# Patient Record
Sex: Male | Born: 1970 | Hispanic: Yes | Marital: Married | Smoking: Never smoker
Health system: Southern US, Community
[De-identification: ages and names within clinical notes are randomized; demographics above are authoritative.]

## PROBLEM LIST (undated history)

## (undated) DIAGNOSIS — I10 Essential (primary) hypertension: Secondary | ICD-10-CM

## (undated) DIAGNOSIS — E785 Hyperlipidemia, unspecified: Secondary | ICD-10-CM

## (undated) DIAGNOSIS — G5600 Carpal tunnel syndrome, unspecified upper limb: Secondary | ICD-10-CM

## (undated) HISTORY — PX: HERNIA REPAIR: SHX51

---

## 1999-09-05 ENCOUNTER — Emergency Department (HOSPITAL_COMMUNITY): Admission: EM | Admit: 1999-09-05 | Discharge: 1999-09-05 | Payer: Self-pay

## 2001-12-09 ENCOUNTER — Emergency Department (HOSPITAL_COMMUNITY): Admission: EM | Admit: 2001-12-09 | Discharge: 2001-12-09 | Payer: Self-pay | Admitting: Emergency Medicine

## 2001-12-09 ENCOUNTER — Encounter: Payer: Self-pay | Admitting: Emergency Medicine

## 2008-06-11 ENCOUNTER — Ambulatory Visit: Payer: Self-pay | Admitting: *Deleted

## 2008-06-11 ENCOUNTER — Encounter: Payer: Self-pay | Admitting: Family Medicine

## 2008-06-11 ENCOUNTER — Ambulatory Visit: Payer: Self-pay | Admitting: Internal Medicine

## 2008-06-11 LAB — CONVERTED CEMR LAB
ALT: 20 units/L (ref 0–53)
AST: 21 units/L (ref 0–37)
Albumin: 4.8 g/dL (ref 3.5–5.2)
Alkaline Phosphatase: 76 units/L (ref 39–117)
BUN: 15 mg/dL (ref 6–23)
Basophils Absolute: 0.1 10*3/uL (ref 0.0–0.1)
Basophils Relative: 1 % (ref 0–1)
CO2: 25 meq/L (ref 19–32)
Calcium: 10 mg/dL (ref 8.4–10.5)
Chloride: 106 meq/L (ref 96–112)
Cholesterol: 220 mg/dL — ABNORMAL HIGH (ref 0–200)
Creatinine, Ser: 0.81 mg/dL (ref 0.40–1.50)
Eosinophils Absolute: 0.2 10*3/uL (ref 0.0–0.7)
Eosinophils Relative: 3 % (ref 0–5)
Glucose, Bld: 98 mg/dL (ref 70–99)
HCT: 50.4 % (ref 39.0–52.0)
HDL: 38 mg/dL — ABNORMAL LOW (ref >39)
Hemoglobin: 17.7 g/dL — ABNORMAL HIGH (ref 13.0–17.0)
LDL Cholesterol: 138 mg/dL — ABNORMAL HIGH (ref 0–99)
Lymphocytes Relative: 41 % (ref 12–46)
Lymphs Abs: 2.9 10*3/uL (ref 0.7–4.0)
MCHC: 35.1 g/dL (ref 30.0–36.0)
MCV: 91.3 fL (ref 78.0–100.0)
Monocytes Absolute: 0.5 10*3/uL (ref 0.1–1.0)
Monocytes Relative: 7 % (ref 3–12)
Neutro Abs: 3.4 10*3/uL (ref 1.7–7.7)
Neutrophils Relative %: 48 % (ref 43–77)
PSA: 0.27 ng/mL (ref 0.10–4.00)
Platelets: 279 10*3/uL (ref 150–400)
Potassium: 4.7 meq/L (ref 3.5–5.3)
RBC: 5.52 M/uL (ref 4.22–5.81)
RDW: 13.2 % (ref 11.5–15.5)
Sodium: 143 meq/L (ref 135–145)
TSH: 2.064 microintl units/mL (ref 0.350–4.50)
Total Bilirubin: 1.4 mg/dL — ABNORMAL HIGH (ref 0.3–1.2)
Total CHOL/HDL Ratio: 5.8
Total Protein: 7.8 g/dL (ref 6.0–8.3)
Triglycerides: 219 mg/dL — ABNORMAL HIGH (ref <150)
VLDL: 44 mg/dL — ABNORMAL HIGH (ref 0–40)
WBC: 7.1 10*3/uL (ref 4.0–10.5)

## 2008-06-21 ENCOUNTER — Emergency Department (HOSPITAL_COMMUNITY): Admission: EM | Admit: 2008-06-21 | Discharge: 2008-06-21 | Payer: Self-pay | Admitting: Emergency Medicine

## 2008-06-30 ENCOUNTER — Ambulatory Visit: Payer: Self-pay | Admitting: Internal Medicine

## 2008-06-30 ENCOUNTER — Encounter: Payer: Self-pay | Admitting: Family Medicine

## 2008-06-30 LAB — CONVERTED CEMR LAB
ALT: 29 units/L (ref 0–53)
AST: 19 units/L (ref 0–37)
Albumin: 4.7 g/dL (ref 3.5–5.2)
Alkaline Phosphatase: 72 units/L (ref 39–117)
Amphetamine Screen, Ur: NEGATIVE
BUN: 16 mg/dL (ref 6–23)
Barbiturate Quant, Ur: NEGATIVE
Benzodiazepines.: NEGATIVE
CO2: 24 meq/L (ref 19–32)
Calcium: 9.7 mg/dL (ref 8.4–10.5)
Chloride: 103 meq/L (ref 96–112)
Cocaine Metabolites: NEGATIVE
Creatinine, Ser: 0.78 mg/dL (ref 0.40–1.50)
Creatinine,U: 97 mg/dL
Glucose, Bld: 100 mg/dL — ABNORMAL HIGH (ref 70–99)
Marijuana Metabolite: NEGATIVE
Methadone: NEGATIVE
Opiate Screen, Urine: NEGATIVE
Phencyclidine (PCP): NEGATIVE
Potassium: 4.8 meq/L (ref 3.5–5.3)
Propoxyphene: NEGATIVE
Sed Rate: 2 mm/hr (ref 0–16)
Sodium: 140 meq/L (ref 135–145)
Total Bilirubin: 0.9 mg/dL (ref 0.3–1.2)
Total Protein: 7.5 g/dL (ref 6.0–8.3)

## 2008-07-09 ENCOUNTER — Ambulatory Visit: Payer: Self-pay | Admitting: Vascular Surgery

## 2008-07-09 ENCOUNTER — Ambulatory Visit (HOSPITAL_COMMUNITY): Admission: RE | Admit: 2008-07-09 | Discharge: 2008-07-09 | Payer: Self-pay | Admitting: *Deleted

## 2008-08-07 ENCOUNTER — Ambulatory Visit: Payer: Self-pay | Admitting: Family Medicine

## 2008-10-22 ENCOUNTER — Ambulatory Visit: Payer: Self-pay | Admitting: Family Medicine

## 2008-10-22 LAB — CONVERTED CEMR LAB
Chlamydia, DNA Probe: NEGATIVE
GC Probe Amp, Genital: NEGATIVE

## 2009-12-08 ENCOUNTER — Ambulatory Visit: Payer: Self-pay | Admitting: Internal Medicine

## 2010-01-06 ENCOUNTER — Ambulatory Visit: Payer: Self-pay | Admitting: Internal Medicine

## 2010-01-06 LAB — CONVERTED CEMR LAB
BUN: 15 mg/dL (ref 6–23)
CO2: 25 meq/L (ref 19–32)
Calcium: 9.9 mg/dL (ref 8.4–10.5)
Chloride: 102 meq/L (ref 96–112)
Cholesterol: 228 mg/dL — ABNORMAL HIGH (ref 0–200)
Creatinine, Ser: 0.71 mg/dL (ref 0.40–1.50)
Glucose, Bld: 93 mg/dL (ref 70–99)
HDL: 39 mg/dL — ABNORMAL LOW (ref >39)
Hgb A1c MFr Bld: 5.2 % (ref 4.6–6.1)
LDL Cholesterol: 110 mg/dL — ABNORMAL HIGH (ref 0–99)
Potassium: 4.4 meq/L (ref 3.5–5.3)
Sodium: 138 meq/L (ref 135–145)
Testosterone: 509.14 ng/dL (ref 350–890)
Total CHOL/HDL Ratio: 5.8
Triglycerides: 395 mg/dL — ABNORMAL HIGH (ref <150)
VLDL: 79 mg/dL — ABNORMAL HIGH (ref 0–40)

## 2010-01-12 ENCOUNTER — Ambulatory Visit: Payer: Self-pay | Admitting: Internal Medicine

## 2010-02-15 ENCOUNTER — Ambulatory Visit: Payer: Self-pay | Admitting: Internal Medicine

## 2010-02-15 LAB — CONVERTED CEMR LAB
CO2: 24 meq/L (ref 19–32)
Calcium: 9.2 mg/dL (ref 8.4–10.5)
Chloride: 104 meq/L (ref 96–112)
Creatinine, Ser: 0.66 mg/dL (ref 0.40–1.50)
Sodium: 139 meq/L (ref 135–145)

## 2010-02-23 ENCOUNTER — Ambulatory Visit: Payer: Self-pay | Admitting: Internal Medicine

## 2010-11-30 ENCOUNTER — Encounter (INDEPENDENT_AMBULATORY_CARE_PROVIDER_SITE_OTHER): Payer: Self-pay | Admitting: Family Medicine

## 2010-11-30 LAB — CONVERTED CEMR LAB
ALT: 18 units/L (ref 0–53)
AST: 24 units/L (ref 0–37)
Albumin: 4.7 g/dL (ref 3.5–5.2)
CO2: 27 meq/L (ref 19–32)
Calcium: 10.3 mg/dL (ref 8.4–10.5)
Chloride: 102 meq/L (ref 96–112)
Cholesterol: 229 mg/dL — ABNORMAL HIGH (ref 0–200)
Creatinine, Ser: 0.76 mg/dL (ref 0.40–1.50)
Potassium: 5.2 meq/L (ref 3.5–5.3)
Total Protein: 7.4 g/dL (ref 6.0–8.3)

## 2012-08-13 ENCOUNTER — Encounter (HOSPITAL_COMMUNITY): Payer: Self-pay | Admitting: Emergency Medicine

## 2012-08-13 ENCOUNTER — Emergency Department (HOSPITAL_COMMUNITY): Payer: Self-pay

## 2012-08-13 ENCOUNTER — Emergency Department (HOSPITAL_COMMUNITY)
Admission: EM | Admit: 2012-08-13 | Discharge: 2012-08-13 | Disposition: A | Discharge status: Home / Self Care | Payer: Self-pay | Attending: Emergency Medicine | Admitting: Emergency Medicine

## 2012-08-13 DIAGNOSIS — W19XXXA Unspecified fall, initial encounter: Secondary | ICD-10-CM

## 2012-08-13 DIAGNOSIS — I10 Essential (primary) hypertension: Secondary | ICD-10-CM | POA: Insufficient documentation

## 2012-08-13 DIAGNOSIS — S20229A Contusion of unspecified back wall of thorax, initial encounter: Secondary | ICD-10-CM | POA: Insufficient documentation

## 2012-08-13 DIAGNOSIS — S39012A Strain of muscle, fascia and tendon of lower back, initial encounter: Secondary | ICD-10-CM

## 2012-08-13 DIAGNOSIS — N200 Calculus of kidney: Secondary | ICD-10-CM | POA: Insufficient documentation

## 2012-08-13 DIAGNOSIS — W108XXA Fall (on) (from) other stairs and steps, initial encounter: Secondary | ICD-10-CM | POA: Insufficient documentation

## 2012-08-13 DIAGNOSIS — Y92009 Unspecified place in unspecified non-institutional (private) residence as the place of occurrence of the external cause: Secondary | ICD-10-CM | POA: Insufficient documentation

## 2012-08-13 HISTORY — DX: Essential (primary) hypertension: I10

## 2012-08-13 MED ORDER — HYDROCODONE-ACETAMINOPHEN 5-500 MG PO TABS: 1.0000 | ORAL_TABLET | Freq: Four times a day (QID) | ORAL | Status: DC | PRN

## 2012-08-13 MED ORDER — HYDROCODONE-ACETAMINOPHEN 5-325 MG PO TABS
2.0000 | ORAL_TABLET | Freq: Once | ORAL | Status: AC
  Administered 2012-08-13: 2 via ORAL
  Filled 2012-08-13: qty 2

## 2012-08-13 NOTE — ED Notes (Signed)
Pt c/o fall down 5 stairs and now having pain in lower back; pt sts some difficulty standing due to pain; CMS intact

## 2012-08-13 NOTE — ED Provider Notes (Signed)
History    This chart was scribed for Suzi Roots, MD, MD by Smitty Pluck. The patient was seen in room TR11C and the patient's care was started at 12:03PM.   CSN: [id number]  Arrival date & time 08/13/12  1133      Chief Complaint  Patient presents with  . Fall  . Back Pain    (Consider location/radiation/quality/duration/timing/severity/associated sxs/prior treatment) Patient is a 41 y.o. male presenting with fall and back pain. The history is provided by the patient and a relative. No language interpreter was used.  Fall Pertinent negatives include no fever, no abdominal pain, no nausea, no vomiting and no headaches.  Back Pain  Pertinent negatives include no chest pain, no fever, no headaches, no abdominal pain and no weakness.   Shawn Porter is a 41 y.o. male who presents to the Emergency Department complaining of constant, moderate lower back pain onset today due to falling down steps at home. Pt reports that standing and bearing weight aggravates pain. Denies hx of back pain, head injury, chest pain, abdominal pain, chest pain and LOC. Denies taking medication pta.   No loc. No faintness or dizziness. No headache. No neck pain. No numbness/weakness. No gi or gu c/o.   Past Medical History  Diagnosis Date  . Hypertension     History reviewed. No pertinent past surgical history.  History reviewed. No pertinent family history.  History  Substance Use Topics  . Smoking status: Never Smoker   . Smokeless tobacco: Not on file  . Alcohol Use: No      Review of Systems  Constitutional: Negative for fever and chills.  HENT: Negative for neck pain.   Eyes: Negative for redness.  Respiratory: Negative for shortness of breath.   Cardiovascular: Negative for chest pain.  Gastrointestinal: Negative for nausea, vomiting and abdominal pain.  Musculoskeletal: Positive for back pain.  Skin: Negative for wound.  Neurological: Negative for weakness, light-headedness  and headaches.  Psychiatric/Behavioral: Negative for confusion.    Allergies  Review of patient's allergies indicates no known allergies.  Home Medications  No current outpatient prescriptions on file.  BP 155/108  Pulse 64  Temp 98.4 F (36.9 C) (Oral)  Resp 18  SpO2 100%  Physical Exam  Nursing note and vitals reviewed. Constitutional: He is oriented to person, place, and time. He appears well-developed and well-nourished. No distress.  HENT:  Head: Normocephalic and atraumatic.  Eyes: Conjunctivae normal are normal. Pupils are equal, round, and reactive to light. No scleral icterus.  Neck: Normal range of motion. Neck supple. No tracheal deviation present.  Cardiovascular: Normal rate, regular rhythm, normal heart sounds and intact distal pulses.   Pulmonary/Chest: Effort normal. No respiratory distress. He exhibits no tenderness.  Abdominal: Soft. He exhibits no distension. There is no tenderness.  Musculoskeletal:       Diffuse lumbar tenderness,. Otherwise CTLS spine, non tender, aligned, no step off.   Neurological: He is alert and oriented to person, place, and time. No cranial nerve deficit.       Motor intact bil.   Skin: Skin is warm and dry. No rash noted.  Psychiatric: He has a normal mood and affect. His behavior is normal.    ED Course  Procedures (including critical care time) DIAGNOSTIC STUDIES: Oxygen Saturation is 100% on room air, normal by my interpretation.    COORDINATION OF CARE: 12:05PM  Discussed ED treatment with pt  12:17 PM    . HYDROcodone-acetaminophen  2 tablet Oral  Once   Dg Lumbar Spine Complete  08/13/2012  *RADIOLOGY REPORT*  Clinical Data: Fall, low back pain  LUMBAR SPINE - COMPLETE 4+ VIEW  Comparison: None.  Findings: Nonobstructing left lower renal pole calculus noted measuring 4 mm. Five non-rib bearing lumbar type vertebral bodies are identified.  Normal alignment.  No fracture or dislocation. Normal visualized bowel gas  pattern.  IMPRESSION: No acute osseous abnormality of the lumbar spine.   Original Report Authenticated By: [name] Lemon, M.D.          MDM  I personally performed the services described in this documentation, which was scribed in my presence. The recorded information has been reviewed and considered. Suzi Roots, MD  vicodin po ( no meds pta, pt has ride/does not have to drive). Confirmed nkda w pt.   Xrays.   Xray neg acute.    Suzi Roots, MD 08/13/12 [phone]

## 2012-11-08 ENCOUNTER — Emergency Department (INDEPENDENT_AMBULATORY_CARE_PROVIDER_SITE_OTHER)
Admission: EM | Admit: 2012-11-08 | Discharge: 2012-11-08 | Disposition: A | Discharge status: Home / Self Care | Payer: Self-pay | Source: Home / Self Care

## 2012-11-08 ENCOUNTER — Encounter (HOSPITAL_COMMUNITY): Payer: Self-pay | Admitting: Emergency Medicine

## 2012-11-08 DIAGNOSIS — E7800 Pure hypercholesterolemia, unspecified: Secondary | ICD-10-CM

## 2012-11-08 DIAGNOSIS — R229 Localized swelling, mass and lump, unspecified: Secondary | ICD-10-CM

## 2012-11-08 DIAGNOSIS — M545 Low back pain, unspecified: Secondary | ICD-10-CM

## 2012-11-08 DIAGNOSIS — I10 Essential (primary) hypertension: Secondary | ICD-10-CM

## 2012-11-08 HISTORY — DX: Hyperlipidemia, unspecified: E78.5

## 2012-11-08 LAB — CBC WITH DIFFERENTIAL/PLATELET
Basophils Absolute: 0 10*3/uL (ref 0.0–0.1)
Basophils Relative: 1 % (ref 0–1)
Eosinophils Absolute: 0.3 10*3/uL (ref 0.0–0.7)
Eosinophils Relative: 4 % (ref 0–5)
HCT: 44.8 % (ref 39.0–52.0)
Hemoglobin: 16.3 g/dL (ref 13.0–17.0)
Lymphocytes Relative: 43 % (ref 12–46)
Lymphs Abs: 2.9 10*3/uL (ref 0.7–4.0)
MCH: 32.2 pg (ref 26.0–34.0)
MCHC: 36.4 g/dL — ABNORMAL HIGH (ref 30.0–36.0)
MCV: 88.5 fL (ref 78.0–100.0)
Monocytes Absolute: 0.5 10*3/uL (ref 0.1–1.0)
Monocytes Relative: 7 % (ref 3–12)
Neutro Abs: 3.1 10*3/uL (ref 1.7–7.7)
Neutrophils Relative %: 46 % (ref 43–77)
Platelets: 249 10*3/uL (ref 150–400)
RBC: 5.06 MIL/uL (ref 4.22–5.81)
RDW: 12.4 % (ref 11.5–15.5)
WBC: 6.9 10*3/uL (ref 4.0–10.5)

## 2012-11-08 LAB — COMPREHENSIVE METABOLIC PANEL
ALT: 38 U/L (ref 0–53)
AST: 28 U/L (ref 0–37)
Albumin: 4.1 g/dL (ref 3.5–5.2)
Alkaline Phosphatase: 67 U/L (ref 39–117)
BUN: 13 mg/dL (ref 6–23)
CO2: 26 mEq/L (ref 19–32)
Calcium: 9.1 mg/dL (ref 8.4–10.5)
Chloride: 104 mEq/L (ref 96–112)
Creatinine, Ser: 0.78 mg/dL (ref 0.50–1.35)
GFR calc Af Amer: >90 mL/min (ref >90)
GFR calc non Af Amer: >90 mL/min (ref >90)
Glucose, Bld: 96 mg/dL (ref 70–99)
Potassium: 3.8 mEq/L (ref 3.5–5.1)
Sodium: 138 mEq/L (ref 135–145)
Total Bilirubin: 0.7 mg/dL (ref 0.3–1.2)
Total Protein: 7 g/dL (ref 6.0–8.3)

## 2012-11-08 MED ORDER — LISINOPRIL 10 MG PO TABS: 10.0000 mg | ORAL_TABLET | Freq: Every day | ORAL | Status: DC

## 2012-11-08 MED ORDER — HYDROCODONE-ACETAMINOPHEN 5-500 MG PO CAPS: 1.0000 | ORAL_CAPSULE | Freq: Four times a day (QID) | ORAL | Status: DC | PRN

## 2012-11-08 MED ORDER — PRAVASTATIN SODIUM 20 MG PO TABS: 20.0000 mg | ORAL_TABLET | Freq: Every day | ORAL | Status: DC

## 2012-11-08 NOTE — ED Notes (Signed)
Pt is here to have his lisinopril 10mg  and Pravastatin 20mg  refilled Has not been able to find PCP due to [facility]'s closure Also c/o abscess behind neck x20 days No pain associated  Denies: fevers, vomiting, nauseas, diarrhea  He is alert w/no signs of acute distress.

## 2012-11-11 ENCOUNTER — Telehealth (HOSPITAL_COMMUNITY): Payer: Self-pay | Admitting: Family Medicine

## 2012-11-11 NOTE — ED Provider Notes (Signed)
Medical screening examination/treatment/procedure(s) were performed by non-physician practitioner and as supervising physician I was immediately available for consultation/collaboration.  Raynald Blend, MD 11/11/12 1627

## 2012-11-11 NOTE — ED Notes (Signed)
Pharmacy called stating Hydrocodone-Acetaminophen 5-500 is no longer available.  Dr Lorenz Coaster reviewed chart RX changed to hydrocodone-Acetaminophen 5-325 same directions and dispense quantity.

## 2012-11-11 NOTE — ED Provider Notes (Signed)
History     CSN: [id number]  Arrival date & time 11/08/12  1653   None     Chief Complaint  Patient presents with  . Medication Refill  . Abscess    HPI: The history is provided by the patient.  Pt reports he needs his Lisinopril and Pravachol refilled. He was a pt at [facility] and has been unable to find a PCP since the closure of [facility]. Denies any symptoms at this time. Pt does request evaluation of some nodules on his upper back and neck.  The lesion to his posterior neck he has had approx 20 days. States it is not painful and does not itch. The 2 nodules on his upper back he states he has had a "long time".  Past Medical History  Diagnosis Date  . Hypertension   . Hyperlipidemia     History reviewed. No pertinent past surgical history.  No family history on file.  History  Substance Use Topics  . Smoking status: Never Smoker   . Smokeless tobacco: Not on file  . Alcohol Use: No      Review of Systems  Constitutional: Negative.   HENT: Negative.   Eyes: Negative.   Respiratory: Negative.   Cardiovascular: Negative.   Gastrointestinal: Negative.   Genitourinary: Negative.   Musculoskeletal: Negative.   Neurological: Negative.   Hematological: Negative.   Psychiatric/Behavioral: Negative.     Allergies  Review of patient's allergies indicates no known allergies.  Home Medications   Current Outpatient Rx  Name  Route  Sig  Dispense  Refill  . LISINOPRIL 10 MG PO TABS   Oral   Take 10 mg by mouth daily.         Marland Kitchen HYDROCODONE-ACETAMINOPHEN 5-500 MG PO CAPS   Oral   Take 1 capsule by mouth every 6 (six) hours as needed for pain.   10 capsule   0   . LISINOPRIL 10 MG PO TABS   Oral   Take 1 tablet (10 mg total) by mouth daily.   30 tablet   0   . PRAVASTATIN SODIUM 20 MG PO TABS   Oral   Take 1 tablet (20 mg total) by mouth daily.   30 tablet   0   . PRAVASTATIN SODIUM 20 MG PO TABS   Oral   Take 1 tablet (20 mg total) by  mouth daily.   30 tablet   1     BP 119/64  Pulse 60  Temp 98.7 F (37.1 C) (Oral)  Resp 16  SpO2 100%  Physical Exam  Constitutional: He is oriented to person, place, and time. He appears well-developed and well-nourished.  HENT:  Head: Normocephalic and atraumatic.  Eyes: Conjunctivae normal are normal.  Neck: Normal range of motion. Neck supple.  Cardiovascular: Normal rate and regular rhythm.   Pulmonary/Chest: Effort normal and breath sounds normal.  Musculoskeletal: Normal range of motion.  Neurological: He is alert and oriented to person, place, and time.  Skin: Skin is warm and dry.          The lesion to the posterior neck is approx 1.5 cm in size, slightly raised, eryhthematous with an orange peel type surface. It does not have active drainage or any evidence of local cellulitis. There is no palpable fluctuance. No TTP.  There are 2 raised nodules to pt's upper back that are approx 2cm in size,  firm to palpation, w/o redness, inflammation or drainage. No TTP, No palpable fluctuance.  Psychiatric: He has a normal mood and affect.    ED Course  Procedures (including critical care time)  Labs Reviewed  CBC WITH DIFFERENTIAL - Abnormal; Notable for the following:    MCHC 36.4 (*)     All other components within normal limits  COMPREHENSIVE METABOLIC PANEL  LAB REPORT - SCANNED   No results found.   1. HTN (hypertension)   2. High cholesterol   3. Multiple skin nodules   4. Low back pain       MDM  42 y/o w/ h/o HTH and hyperlipidemia presents for med refill. No PCP since closing of [facility]. Also requested eval of nodules to posterior neck and upper back. Lesion to upper back is somewhat erythematous w/ (orange peel surface). Though unclear exact nature of this lesion it does not appear infectious or in immediate need of treatment. Prefer to wait and watch. The nodules (possible lipomas) to his upper back appear chronic in nature w/o evidence of  infectious process. Will refill Lisinopril and Pravachol and assist pt in arranging f[facility]u in the [facility].        Leanne Chang, NP 11/11/12 1041

## 2012-12-20 ENCOUNTER — Emergency Department (INDEPENDENT_AMBULATORY_CARE_PROVIDER_SITE_OTHER)
Admission: EM | Admit: 2012-12-20 | Discharge: 2012-12-20 | Disposition: A | Discharge status: Home / Self Care | Payer: Self-pay | Source: Home / Self Care | Attending: Family Medicine | Admitting: Family Medicine

## 2012-12-20 ENCOUNTER — Encounter (HOSPITAL_COMMUNITY): Payer: Self-pay | Admitting: *Deleted

## 2012-12-20 DIAGNOSIS — G8929 Other chronic pain: Secondary | ICD-10-CM

## 2012-12-20 DIAGNOSIS — M545 Low back pain: Secondary | ICD-10-CM

## 2012-12-20 DIAGNOSIS — R52 Pain, unspecified: Secondary | ICD-10-CM

## 2012-12-20 MED ORDER — KETOROLAC TROMETHAMINE 30 MG/ML IJ SOLN
30.0000 mg | Freq: Once | INTRAMUSCULAR | Status: AC
  Administered 2012-12-20: 30 mg via INTRAMUSCULAR

## 2012-12-20 MED ORDER — HYDROCODONE-ACETAMINOPHEN 5-325 MG PO TABS
1.0000 | ORAL_TABLET | Freq: Once | ORAL | Status: AC
  Administered 2012-12-20: 1 via ORAL

## 2012-12-20 MED ORDER — HYDROCODONE-ACETAMINOPHEN 5-325 MG PO TABS
ORAL_TABLET | ORAL | Status: AC
  Filled 2012-12-20: qty 1

## 2012-12-20 MED ORDER — CYCLOBENZAPRINE HCL 10 MG PO TABS: 10.0000 mg | ORAL_TABLET | Freq: Two times a day (BID) | ORAL | Status: DC | PRN

## 2012-12-20 MED ORDER — TRAMADOL HCL 50 MG PO TABS: 50.0000 mg | ORAL_TABLET | Freq: Four times a day (QID) | ORAL | Status: DC | PRN

## 2012-12-20 MED ORDER — KETOROLAC TROMETHAMINE 30 MG/ML IJ SOLN
INTRAMUSCULAR | Status: AC
  Filled 2012-12-20: qty 1

## 2012-12-20 MED ORDER — IBUPROFEN 600 MG PO TABS: 600.0000 mg | ORAL_TABLET | Freq: Three times a day (TID) | ORAL | Status: DC | PRN

## 2012-12-20 NOTE — ED Notes (Signed)
Pt  States  He  Was  Lifting   A  Heavy  Object  Yesterday  And  Felt  A   Severe  Pain in  His  Back  He  Ambulates  Very  Slowly  And  Bent  Over              He  Reports    Has  hab  Back  Problems  In past  He  denys  Any urinary  Symptoms

## 2012-12-20 NOTE — ED Provider Notes (Signed)
History     CSN: [id number]  Arrival date & time 12/20/12  1222   First MD Initiated Contact with Patient 12/20/12 1253      Chief Complaint  Patient presents with  . Back Pain    (Consider location/radiation/quality/duration/timing/severity/associated sxs/prior treatment) HPI Comments: 42 year old male with history of hypertension and recurrent low back pain. Here complaining of exacerbation of chronic low back pain. Patient states that she lifted a pressure washer machine just today and developed severe pain in his low back. Pain worse with standing, walking and moving. Denies radiation to the lower extremities. Denies lower extremity numbness weakness or paresthesia. Denies dysuria or hematuria. No nausea or vomiting. No abdominal pain. No fever or chills. Patient to a hydrocodone tablet earlier today from prior prescription. He had lumbar spine x-rays in October 2013 with normal results.     Past Medical History  Diagnosis Date  . Hypertension   . Hyperlipidemia     History reviewed. No pertinent past surgical history.  No family history on file.  History  Substance Use Topics  . Smoking status: Never Smoker   . Smokeless tobacco: Not on file  . Alcohol Use: Yes     Comment: occasonally      Review of Systems  Constitutional: Negative for fever, chills and fatigue.  Respiratory: Negative for shortness of breath.   Cardiovascular: Negative for chest pain.  Gastrointestinal: Negative for nausea, vomiting, abdominal pain and diarrhea.  Genitourinary: Negative for dysuria, frequency, hematuria and flank pain.  Musculoskeletal: Positive for back pain. Negative for myalgias and arthralgias.  Neurological: Negative for weakness, numbness and headaches.  All other systems reviewed and are negative.    Allergies  Review of patient's allergies indicates no known allergies.  Home Medications   Current Outpatient Rx  Name  Route  Sig  Dispense  Refill  .  cyclobenzaprine (FLEXERIL) 10 MG tablet   Oral   Take 1 tablet (10 mg total) by mouth 2 (two) times daily as needed for muscle spasms.   20 tablet   0   . hydrocodone-acetaminophen (LORCET-HD) 5-500 MG per capsule   Oral   Take 1 capsule by mouth every 6 (six) hours as needed for pain.   10 capsule   0   . ibuprofen (ADVIL,MOTRIN) 600 MG tablet   Oral   Take 1 tablet (600 mg total) by mouth every 8 (eight) hours as needed for pain. Take with food   30 tablet   0   . lisinopril (PRINIVIL) 10 MG tablet   Oral   Take 1 tablet (10 mg total) by mouth daily.   30 tablet   0   . lisinopril (PRINIVIL,ZESTRIL) 10 MG tablet   Oral   Take 10 mg by mouth daily.         . pravastatin (PRAVACHOL) 20 MG tablet   Oral   Take 1 tablet (20 mg total) by mouth daily.   30 tablet   0   . pravastatin (PRAVACHOL) 20 MG tablet   Oral   Take 1 tablet (20 mg total) by mouth daily.   30 tablet   1   . traMADol (ULTRAM) 50 MG tablet   Oral   Take 1 tablet (50 mg total) by mouth every 6 (six) hours as needed for pain.   15 tablet   0     BP 152/76  Pulse 88  Temp(Src) 98.6 F (37 C) (Oral)  SpO2 100%  Physical Exam  Nursing note  and vitals reviewed. Constitutional: He is oriented to person, place, and time. He appears well-developed and well-nourished.  Uncomfortable with pain.  HENT:  Head: Normocephalic and atraumatic.  Eyes: No scleral icterus.  Neck: Neck supple.  Cardiovascular: Normal heart sounds.   Pulmonary/Chest: Breath sounds normal.  Abdominal: Soft.  No costovertebral tenderness bilaterally  Musculoskeletal:  Central spine with no scoliosis or kyphosis. Limited anterior flexion and posterior extension due to reported severe pain. Patient able to walk on tip toes and heels with no difficulty foot drop or pain exacerbation. No bone prominence tenderness. Tenderness to palpation in bilateral lumbar paravertebral muscles, worse on the right side at the level of the  mid back. Also with increased muscle tone.  Negative straight leg test bilateral. Intact sensation and symmetric + DTRs (rotullian and achillean) in low extremities.   Lymphadenopathy:    He has no cervical adenopathy.  Neurological: He is alert and oriented to person, place, and time.  Skin: No rash noted. He is not diaphoretic.    ED Course  Procedures (including critical care time)  Labs Reviewed - No data to display No results found.   1. Acute exacerbation of chronic low back pain       MDM  Treated with tramadol 30mg  IM x1 here and hydrocodone/acetaminophen 5/325mg  oral x1. Prescribed ibuprofen, tramadol and flexeril. Supportive care including back exercises and red flags that should prompt his return to medical attention discussed with patient and provided in writing.        Sharin Grave, MD 12/22/12 1105

## 2012-12-27 ENCOUNTER — Emergency Department (HOSPITAL_COMMUNITY)
Admission: EM | Admit: 2012-12-27 | Discharge: 2012-12-27 | Disposition: A | Discharge status: Home / Self Care | Payer: No Typology Code available for payment source | Source: Home / Self Care

## 2012-12-27 ENCOUNTER — Encounter (HOSPITAL_COMMUNITY): Payer: Self-pay

## 2012-12-27 DIAGNOSIS — M545 Low back pain: Secondary | ICD-10-CM

## 2012-12-27 DIAGNOSIS — E7800 Pure hypercholesterolemia, unspecified: Secondary | ICD-10-CM

## 2012-12-27 DIAGNOSIS — I10 Essential (primary) hypertension: Secondary | ICD-10-CM

## 2012-12-27 MED ORDER — LISINOPRIL 10 MG PO TABS: 10.0000 mg | ORAL_TABLET | Freq: Every day | ORAL | Status: DC

## 2012-12-27 MED ORDER — PRAVASTATIN SODIUM 20 MG PO TABS: 20.0000 mg | ORAL_TABLET | Freq: Every day | ORAL | Status: DC

## 2012-12-27 NOTE — ED Notes (Signed)
Patient states has back pain-would like to referred to specialist Medication refill

## 2012-12-27 NOTE — ED Provider Notes (Addendum)
Patient Demographics  Shawn Porter, is a 43 y.o. male  GEX:[id number]  WNU:[id number]  DOB - Apr 19, 1971  Chief Complaint  Patient presents with  . Medication Refill  . Back Pain        Subjective:   Shawn Porter is a 41yo h/o HTN, hyperlipidemia and recurrent low back pain who presents today for refill of his meds and follow up on back pain. He states that since the last visit his back pain go better but he admits to lifting heavy objects again and the pain came back . 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 He denies weakness and no parasthesias, also no fevers. Also today has, No headache, No chest pain, No abdominal pain - No Nausea, No new weakness tingling or numbness, No Cough - SOB.   Objective:    Filed Vitals:   12/27/12 1706  BP: 135/89  Pulse: 82  Temp: 98.1 F (36.7 C)  TempSrc: Oral  SpO2: 98%     Exam  Awake Alert, Oriented X 3, No new F.N deficits, Normal affect [place].AT,PERRAL Supple Neck,No JVD, No cervical lymphadenopathy appriciated.  Symmetrical Chest wall movement, Good air movement bilaterally, CTAB RRR,No Gallops,Rubs or new Murmurs, No Parasternal Heave Back: He has a brace on, mild lumbar spine tenderness +ve B.Sounds, Abd Soft, Non tender, No organomegaly appriciated, No rebound - guarding or rigidity. No Cyanosis, Clubbing or edema, No new Rash or bruise  Neuro: Strength 5/5 and sensory grossly intact   Data Review   CBC No results found for this basename: WBC, HGB, HCT, PLT, MCV, MCH, MCHC, RDW, NEUTRABS, LYMPHSABS, MONOABS, EOSABS, BASOSABS, BANDABS, BANDSABD,  in the last 168 hours  Chemistries   No results found for this basename: NA, K, CL, CO2, GLUCOSE, BUN, CREATININE, GFRCGP, CALCIUM, MG, AST, ALT, ALKPHOS, BILITOT,  in the last 168 hours ------------------------------------------------------------------------------------------------------------------ No results found for this basename:  HGBA1C,  in the last 72 hours ------------------------------------------------------------------------------------------------------------------ No results found for this basename: CHOL, HDL, LDLCALC, TRIG, CHOLHDL, LDLDIRECT,  in the last 72 hours ------------------------------------------------------------------------------------------------------------------ No results found for this basename: TSH, T4TOTAL, FREET3, T3FREE, THYROIDAB,  in the last 72 hours ------------------------------------------------------------------------------------------------------------------ No results found for this basename: VITAMINB12, FOLATE, FERRITIN, TIBC, IRON, RETICCTPCT,  in the last 72 hours  Coagulation profile  No results found for this basename: INR, PROTIME,  in the last 168 hours     Prior to Admission medications   Medication Sig Start Date End Date Taking? Authorizing Provider  cyclobenzaprine (FLEXERIL) 10 MG tablet Take 1 tablet (10 mg total) by mouth 2 (two) times daily as needed for muscle spasms. 12/20/12   Adlih Moreno-Coll, MD  hydrocodone-acetaminophen (LORCET-HD) 5-500 MG per capsule Take 1 capsule by mouth every 6 (six) hours as needed for pain. 11/08/12   Jimmie Molly, MD  ibuprofen (ADVIL,MOTRIN) 600 MG tablet Take 1 tablet (600 mg total) by mouth every 8 (eight) hours as needed for pain. Take with food 12/20/12   Adlih Moreno-Coll, MD  lisinopril (PRINIVIL) 10 MG tablet Take 1 tablet (10 mg total) by mouth daily. 12/27/12   Kela Millin, MD  lisinopril (PRINIVIL,ZESTRIL) 10 MG tablet Take 10 mg by mouth daily.    Historical Provider, MD  pravastatin (PRAVACHOL) 20 MG tablet Take 1 tablet (20 mg total) by mouth daily. 11/08/12   Roma Kayser Schorr, NP  pravastatin (PRAVACHOL) 20 MG tablet Take 1 tablet (20 mg total) by mouth daily. 12/27/12  Kela Millin, MD  traMADol (ULTRAM) 50 MG tablet Take 1 tablet (50 mg total) by mouth every 6 (six) hours as needed for pain. 12/20/12   Sharin Grave, MD     Assessment & Plan   Recurrent Low back pain -continue flexeril, nsaids and ultram as needed -I have strongly recommeneded that he not lift anything more than 5 lbs -I have referred him to PT  HTN -OK control, I have refilled his lisinopril -follow up in 3mos Hyperlipidemia -continue pravachol, I have also refilled it.   Follow-up Information   Schedule an appointment as soon as possible for a visit in 3 months to follow up. (or as needed)        Lyvonne Cassell C M.D on 12/27/2012 at 6:23 PM   Kela Millin, MD 12/29/12 1156  Kela Millin, MD 12/29/12 1156

## 2013-01-01 NOTE — ED Notes (Signed)
Referral sent to PT

## 2013-04-24 ENCOUNTER — Ambulatory Visit: Payer: Self-pay | Admitting: Pediatrics

## 2013-05-06 ENCOUNTER — Ambulatory Visit: Payer: No Typology Code available for payment source | Attending: Family Medicine | Admitting: Internal Medicine

## 2013-05-06 VITALS — BP 133/87 | HR 64 | Temp 98.6°F | Resp 16 | Wt 169.0 lb

## 2013-05-06 DIAGNOSIS — E785 Hyperlipidemia, unspecified: Secondary | ICD-10-CM

## 2013-05-06 DIAGNOSIS — I10 Essential (primary) hypertension: Secondary | ICD-10-CM

## 2013-05-06 MED ORDER — LISINOPRIL 10 MG PO TABS: 10.0000 mg | ORAL_TABLET | Freq: Every day | ORAL | Status: DC

## 2013-05-06 MED ORDER — PRAVASTATIN SODIUM 20 MG PO TABS: 20.0000 mg | ORAL_TABLET | Freq: Every day | ORAL | Status: DC

## 2013-05-06 NOTE — Patient Instructions (Signed)
Hipertrigliceridemia  Dieta para niveles altos de triglicridos en sangre (Hypertriglyceridemia, Diet for High blood levels of Triglycerides) La mayora de las grasas que contienen los alimentos son los triglicridos. Los triglicridos en la sangre se almacenan como grasa en el cuerpo. Niveles altos de triglicridos en la sangre pueden ponerlo en un mayor riesgo de insuficiencia cardaca e ictus.  El nivel normal de triglicridos es menos del 150 mg/dl. Los niveles al lmite de ser altos estn entre 150 y 199 mg/dl. Los niveles altos estn entre 200 - 499 mg/dL, y los niveles muy altos son mayores que 500 mg/dL. La decisin de Arts administrator se basa en Development worker, community de un nivel elevado de los mismos. Para personas con niveles de triglicridos altos o en el lmite, el tratamiento incluye prdida de peso y [place]. Se recomiendan los medicamentos en aquellas personas con niveles de triglicridos [employer] Corporation. Muchas personas que necesitan tratamiento por tener niveles altos de triglicridos tienen sndrome metablico. Esto consiste en un conjunto de trastornos que incluyen: resistencia a la insulina, presin arterial elevada, problemas de coagulacin, colesterol y triglicridos altos. [place] DE LOS TRIGLICRIDOS  No deber comer nada [place] 4 horas antes del anlisis. El rango normal de triglicridos es de 10 a 301 miligramos por decilitro (mg/dl). Algunas personas podrn ConocoPhillips extremos (1000 o ms) pero el nivel de triglicridos es muy alto si est por encima de 150 mg/dl, dependiendo de qu otros factores de riesgo tiene para una enfermedad cardaca.  Las personas con niveles altos de triglicridos tambin podrn Best boy altos niveles de colesterol. Si tiene [employer]Zone de colesterol y de triglicridos, el riesgo de falla cardaca es mayor que si slo tiene niveles altos de triglicridos. Los Coca Cola de colesterol es uno de los factores principales de  riesgo de [place]. CAMBIOS EN SU DIETA Su peso puede afectar el nivel de triglicridos en la [place]. Si est un 20% o ms por encima de su peso ideal, deber Viacom de triglicridos mediante la prdida de [place]. La mejor forma de combatir esto es comer menos y Engineer, drilling. Las grasas proporcionan ms caloras que cualquier otra comida. La mejor forma de perder peso es consumir menos grasa. Slo un 30% de las caloras totales debern provenir de grasas. Menos del 7% de las caloras totales de su dieta debern provenir de grasas saturadas. Una dieta baja en grasas y grasas saturadas es la misma que la que se realiza para Advertising copywriter. Al consumir una dieta baja en grasas, perder peso, bajar sus niveles de colesterol y [place] de triglicridos.  Consumir una dieta baja en grasas, en especial grasas saturadas, tambin ayudar a su cuerpo a bajar el nivel de triglicridos. Pregunte al nutricionista como saber cunta grasa puede comer segn el nmero de caloras que el mdico le ha indicado.  El ejercicio, adems de ayudar en la prdida de peso tambin puede ayudarlo a bajar los [place] de triglicridos.   El alcohol puede aumentar los [place] de triglicridos. Podr ser necesario que deje de consumir bebidas alcohlicas.  Demasiada cantidad de carbohidratos en su dieta tambin puede aumentar su nivel de triglicridos en sangre. Algunos carbohidratos complejos son necesarios en su dieta. Entre ellos se puede incluir pan, arroz, patatas, otros vegetales que posean almidn y Actor.  Reduzca los hidratos de carbono "simples". Esto puede incluir azcares puros, dulces, miel, y gelatina sin perder otros nutrientes. Si tiene el tipo de triglicridos altos que estn afectados por  el tipo de carbohidratos en su dieta, necesitar consumir menos azcar y menos alimentos con azcar. El profesional que lo asiste lo ayudar.  Aadir 2 a 4 gramos de aceite de pescado (AEP + ADH) tambin  puede ayudar a disminuir triglicridos. Consulte con el mdico antes de aadir suplementos a su dieta. Siga la dieta Mantenga un peso corporal adecuado. El profesional que lo asiste lo ayudar. En general, comer menos y hacer ms ejercicio lo ayudarn a Administrator, Civil Service. Puede ser de utilidad que segn un grupo de apoyo para el control del [place]. Consulte con su especialista acerca de los grupos de [place] de control de peso en su zona.  Consuma comidas bajas en grasa. Esto tambin puede ayudar a [employer] Omaha.  Estos alimentos son bajos en grasas. Coma MS alimentos de los siguientes:   Porotos y guisantes secos; lentejas.  Clara de Eli Lilly and Company.  Requesn bajo en grasa  Pescado.  Cortes magros de carne, como [place], lomito, cuadril, Corporate treasurer (qutele la grasa extra).  Cereales, pastas y panes integrales.  Leche en polvo descremada sin grasa.  Yogur descremado.  Pollo sin piel.  Queso a base de leche descremada o semidescremada, como [place], parmesano, [place] fresco, ricota o requesn. Estos son alimentos Forensic psychologist. Coma MENOS alimentos de los siguientes:   Leche entera y derivados, como queso [place], [place], [place], [place] y [place].  Carnes altas en grasas, como fiambres, embutidos, salchichas de viena, bratwurst, costillas, carne en conserva, carne de cerdo picada y carne picada de vaca sin Ambulance person.  Alimentos fritos. Limite las grasas saturadas en su dieta. Sustituir las grasas insaturadas por saturadas puede ayudarlo a disminuir el nivel de triglicridos. Necesitar leer las etiquetas para saber qu productos contienen grasas saturadas.  Estos alimentos son altos en grasas saturadas. Coma MENOS alimentos de los siguientes:   [place].  Leche entera.  Piel y [place] de [place].  Aceite de palma.  [place].  [place].  Queso crema.  [place].  Margarinas y productos de repostera que contengan los aceites mencionados.  Margarinas vegetales.  Tripas de  cerdo.  Grasas de carnes.  Aceite de coco.  Aceite de almendra de palma.  Grasa de cerdo.  [place].  Nata.  [place] salado.  Blanqueadores para caf y cremas no lcteas hechas con los aceites mencionados.  [place] entera. Utilice grasas no saturadas (poliinsaturadas y monoinsaturadas) de forma moderada. Recuerde, aunque las grasas no saturadas son mejores que las saturadas, [place] debera consumir una dieta baja en grasas totales.  Estos alimentos son altos en grasas no saturadas:   Aceite de canola.  Aceite de girasol.  [place].  Almendras.  [place]  Pias.  Margarinas hechas con los aceites mencionados.  Aceite de Research scientist (physical sciences).  Aceite de oliva.  [place]  Castaas de caj.  [place] de man.  Semillas de girasol.  Aceite de [place] de man.  Aceitunas  [place].  [place]ertie Exon de castilla.  Semillas de calabaza. Evite el azcar y los alimentos altos en azcar. Esto disminuir los carbohidratos sin disminuir otros nutrientes. El azcar en la sangre va rpidamente al cerebro. Cuando hay un exceso de azcar en la sangre, el hgado podr [place] para producir ms triglicridos. Los azcares tambin contienen caloras con otros nutrientes importantes.  Coma MENOS alimentos de los siguientes:   Location manager, azcar negro, azcar impalpable, jaleas, gelatinas, mermeladas, miel, almbar, melaza, pasteles, dulces, tortas, galletitas, glaseados, pasteles, bebidas cola, gaseosas, refrescos y jugos de frutas y gelatina normal.  Evite el alcohol. El alcohol puede  aumentar los niveles de triglicridos, incluso ms que Glass blower/designer. Adems, el alcohol es alto en caloras y bajo en nutrientes. Pida agua mineral o una gaseosa de bajas caloras en vez de una bebida alcohlica. Sugerencias para planear y preparar alimentos  [place], hornee o ase las carnes en vez de frerlas.  Quite la grasa de la carne y la piel de las aves antes de cocinarlas.  Aada especias, hierbas,  jugo de limn o vinagre a los vegetales en vez de sal, salsas pesadas o salsas de carne.  Utilice una sartn antiadherente sin grasas o use sprays antiadherentes.  Refrigere guisos y caldos. Luego elimine la grasa endurecida que flota en la superficie antes de servir.  Refrigere las grasas de la carne asada y desgrsela para hacer salsas de carne sin grasa.  Coma ms pescado.  Utilice menos [place], margarina y otras grasas en panes o vegetales.  Utilice leche en polvo descremada o reconstituida para cocinar.  Cocine con quesos bajos en grasas.  Sustituya el yogur bajo en grasa o queso cottage en toda o parte de las cremas de las recetas de salsas, bocaditos o ensaladas espesas.  Use mitad yogur y [employer].  Sustituya la crema por leche evaporada descremada. La crema batida puede sustituirse por leche evaporada descremada o reconstituida sin grasas en algunas recetas.  Elija frutas frescas para el postre en vez de alimentos altos en grasas como pasteles o tortas. Las frutas son naturalmente bajas en grasas. Al comer afuera  Pida aperitivos bajos en grasa como jugos de frutas y vegetales, pasta con vegetales o salsa de [place].  Seleccione sopas normales en vez de sopas crema.  Pida que las salsas y condimentos se le sirvan a un costado. Use menos cantidad de ellos.  Pida comidas horneadas, hervidas, a fuego lento, al vapor, fritas en poco aceite o asadas.  Pida margarina en vez de [place], y [place] pequea cantidad.  Beba agua mineral, t o caf sin azcar o gaseosas dietticas en vez de alcohol o bebidas dulces. PREGUNTAS Y RESPUESTAS ACERCA DE OTRAS GRASAS EN LA SANGRE:  GRASAS SATURADAS, [place] TRANS Y COLESTEROL Qu son las grasas trans? Son un tipo de grasa que se forma cuando los aceites vegetales se endurecen a Proofreader de un proceso llamado hidrogenacin. Este proceso ayuda a hacer los alimentos ms slidos, darles forma y Teaching laboratory technician su  conservacin. Las grasas trans tambin se denominan aceites hidrogenados o parcialmente hidrogenados.  Qu tienen que ver las grasas saturadas, las grasas trans y Freight forwarder en los alimentos con las enfermedades cardacas? Las grasas saturadas, las grasas trans y Freight forwarder en la dieta elevan los niveles de colesterol LDL "malo" en la [place]. Cuanto ms elevado sea el nivel de colesterol LDL, ms elevado ser el riesgo de sufrir enfermedad cardaca. Las grasas saturadas y las grasas trans elevan el colesterol de [place] similar.  Qu alimentos contienen grasas saturadas, grasas trans y colesterol? Las [place] cantidades de grasas saturadas se encuentran en productos animales, como cortes grasos de carne, piel de ave y productos lcteos enteros como [place], [place], crema y [place], y en aceites vegetales tropicales como el de [place], de almendra de palma y de [place] grasas trans se encuentran a veces en los mismos alimentos como grasas saturadas, como en la [place] vegetal, Building services engineer (en especial las que son duras o en barra), galletas saladas, galletitas, productos de panadera, comidas fritas, aderezo para ensaladas y otros alimentos procesados hechos con aceites  vegetales parcialmente hidrogenados. Tambin existen naturalmente pequeas cantidades de grasas trans en algunos productos animales, como lcteos, carne de vaca y cordero. Los United Auto en colesterol incluyen el hgado, otras vsceras, yema de [place], [place], y productos lcteos enteros. Cmo puedo [employer] Scientific las nuevas etiquetas de los alimentos para hacer elecciones saludables? Observe la pirmide nutricional en la etiqueta del producto. Elija alimentos bajos en grasas saturadas, grasas trans y colesterol. Para grasas saturadas y colesterol, tambin podr Chemical engineer el Pocentaje de Valor Diario (%VD): 5% VD o menos es bajo, y 20% VD o ms es alto. (No hay %VD para las grasas trans.) Utilice el panel de la pirmide  nutricional para elegir alimentos bajos en grasas saturadas y colesterol, y si las grasas saturadas no aparecen, lea los ingredientes y [place] [employer] productos que contengan aderezos de aceites hidrogenados o parcialmente hidrogenados, que tienden a ser ms altos en grasas trans. PUNTOS A RECORDAR:  Converse con el profesional acerca de su enfermedad cardaca y los pasos que debe tomar para reducir factores de riesgo.  Cambie su dieta. Elija alimentos bajos en grasas saturadas, grasas trans y colesterol.  Aada ejercicio a su rutina diaria si an no lo realiza. Participe en actividad fsica moderada, como una caminata enrgica por al menos 30 minutos preferiblemente [employer]. Falta de tiempo? Reparta los 30 minutos en 3 segmentos de 10 minutos durante el da.  Deje de fumar. Si fuma, contctese con el mdico para conversar sobre cmo puede ayudarlo.  No consuma drogas.  Mantenga un peso corporal adecuado.  Mantenga una presin [place].  Contine con el control de grasas en la sangre segn le haya indicado el mdico. Document Released: 04/06/2010 Document Revised: 04/17/2012 [facility] Patient Information 2014 [place], [place]. Hipertensin (Hypertension) Cuando el corazn late Johnson [employer] Johnson sangre a travs de las arterias. La fuerza que se origina es la presin arterial. Si la presin es demasiado elevada, se denomina hipertensin. El peligro radica en que puede sufrirla y no saberlo. Hipertensin puede significar que su corazn debe trabajar ms intensamente para bombear sangre. Las arterias pueden Dietitian o rgidas. El [place] extra [place] el riesgo de enfermedades cardacas, ictus y Ecolab.  La presin arterial est formada por dos nmeros: el nmero mayor sobre el nmero menor, por ejemplo110/70. Se seala "110/72". Los valores ideales son por debajo de 120 para el nmero ms alto (sistlica) y por debajo de 80 para el ms bajo (diastlica). Anote su presin  sangunea hoy. Debe prestar mucha atencin a su presin arterial si sufre alguna otra enfermedad como:  Insuficiencia cardaca  Ataques cardiacos previos  Diabetes  Enfermedad renal crnica  Ictus previo  Mltiples factores de riesgo para enfermedades cardacas Para diagnosticar si usted sufre hipertensin arterial, debe medirse la presin mientras encuentra sentado con el brazo elevado a la altura del nivel del corazn. Debe medirse al menos 2 veces. Una nica lectura de presin arterial elevada (especialmente en el servicio de emergencias) no significa que necesita tratamiento. Hay enfermedades en las que la presin arterial es diferente en ambos brazos. Es importante que consulte rpidamente con su mdico para un control. La Harley-Davidson de las personas sufren hipertensin esencial, lo que significa que no tiene una causa especfica. Este tipo de hipertensin puede bajarse modificando algunos factores en el estilo de vida como:  Librarian, academic.  El consumo de cigarrillos.  La falta de actividad fsica.  Peso excesivo  Consumo de drogas y alcohol.  Consumiendo menos sal La [employer] no  tienen sntomas hasta que la hipertensin ocasiona un dao en el organismo. El tratamiento efectivo puede evitar, Designer, industrial/product o reducir ese dao. TRATAMIENTO: El tratamiento para la hipertensin, cuando se ha identificado una causa, est dirigido a la misma Hay un gran nmero en medicamentos para tratarla. Se agrupan en diferentes categoras y Media planner los medicamentos indicados para usted. Muchos medicamentos disponibles tienen efectos secundarios. Debe comentar los efectos secundarios con su mdico. Si la presin arterial permanece elevada despus de modificar su estilo de vida o comenzar a tomar medicamentos:  Los medicamentos deben ser reemplazados  Puede ser necesario evaluar otros problemas.  Debe estar seguro que comprende las indicaciones, que sabe cmo y cundo [employer].  Asegrese de Education officer, environmental un control con su mdico dentro del tiempo indicado (generalmente dentro de las Marsh & McLennan) para volver a Systems analyst presin arterial y Dentist los medicamentos prescritos.  Si est tomando ms de un medicamento para la presin arterial, asegrese que sabe cmo y en qu momentos debe tomarlos. Tomar los medicamentos al mismo tiempo puede dar como resultado un gran descenso en la presin arterial. SOLICITE ATENCIN MDICA DE INMEDIATO SI PRESENTA:  Dolor de cabeza intenso, visin borrosa o cambios en la visin, o confusin.  Debilidad o adormecimientos inusuales o sensacin de desmayo.  Dolor de pecho o abdominal intenso, vmitos o problemas para respirar. ASEGRESE QUE:   Comprende estas instrucciones.  Controlar su enfermedad.  Solicitar ayuda inmediatamente si no mejora o si empeora. Document Released: 10/17/2005 Document Revised: 01/09/2012 [facility] Patient Information 2014 [place], [place].

## 2013-05-06 NOTE — Progress Notes (Signed)
Patient here for medication refill Takes medication for HTN

## 2013-05-06 NOTE — Progress Notes (Signed)
Patient ID: Shawn Porter, male   DOB: 06-28-1971, 42 y.o.   MRN: [id number]  CC:  HPI: Patient presents for refills of antihypertensives and statin. He has no new complaints. He is working on exercising and eating healthier. No Known Allergies Past Medical History  Diagnosis Date  . Hypertension   . Hyperlipidemia    No current outpatient prescriptions on file prior to visit.   No current facility-administered medications on file prior to visit.   History reviewed. No pertinent family history. History   Social History  . Marital Status: Married    Spouse Name: N/A    Number of Children: N/A  . Years of Education: N/A   Occupational History  . Not on file.   Social History Main Topics  . Smoking status: Never Smoker   . Smokeless tobacco: Not on file  . Alcohol Use: Yes     Comment: occasonally  . Drug Use: No  . Sexually Active: Not on file   Other Topics Concern  . Not on file   Social History Narrative  . No narrative on file    Review of Systems ______ Constitutional: Negative for fever, chills, diaphoresis, activity change, appetite change and fatigue. ____ HENT: Negative for ear pain, nosebleeds, congestion, facial swelling, rhinorrhea, neck pain, neck stiffness and ear discharge.  ____ Eyes: Negative for pain, discharge, redness, itching and visual disturbance. ____ Respiratory: Negative for cough, choking, chest tightness, shortness of breath, wheezing and stridor.  ____ Cardiovascular: Negative for chest pain, palpitations and leg swelling. ____ Gastrointestinal: Negative for Nausea/ Vomiting/ Diarrhea or Consitpation Genitourinary: Negative for dysuria, urgency, frequency, hematuria, flank pain, decreased urine volume, difficulty urinating and dyspareunia. ____ Musculoskeletal: Negative for back pain, joint swelling, arthralgias and gait problem. ________ Neurological: Negative for dizziness, tremors, seizures, syncope, facial asymmetry, speech  difficulty, weakness, light-headedness, numbness and headaches. ____ Hematological: Negative for adenopathy. Does not bruise/bleed easily. ____ Psychiatric/Behavioral: Negative for hallucinations, behavioral problems, confusion, dysphoric mood, decreased concentration and agitation. ______   Objective:   Filed Vitals:   05/06/13 1244  BP: 133/87  Pulse: 64  Temp: 98.6 F (37 C)  Resp: 16    Physical Exam ______ Constitutional: Appears well-developed and well-nourished. No distress. ____ HENT: Normocephalic. External right and left ear normal. Oropharynx is clear and moist. ____ Eyes: Conjunctivae and EOM are normal. PERRLA, no scleral icterus. ____ Neck: Normal ROM. Neck supple. No JVD. No tracheal deviation. No thyromegaly. ____ CVS: RRR, S1/S2 +, no murmurs, no gallops, no carotid bruit.  Pulmonary: Effort and breath sounds normal, no stridor, rhonchi, wheezes, rales.  Abdominal: Soft. BS +,  no distension, tenderness, rebound or guarding. ________ Musculoskeletal: Normal range of motion. No edema and no tenderness. ____ Lymphadenopathy: No lymphadenopathy noted, cervical, inguinal. Neuro: Alert. Normal reflexes, muscle tone coordination. No cranial nerve deficit. Skin: Skin is warm and dry. No rash noted. Not diaphoretic. No erythema. No pallor. ____ Psychiatric: Normal mood and affect. Behavior, judgment, thought content normal. __  Lab Results  Component Value Date   WBC 6.9 11/08/2012   HGB 16.3 11/08/2012   HCT 44.8 11/08/2012   MCV 88.5 11/08/2012   PLT 249 11/08/2012   Lab Results  Component Value Date   CREATININE 0.78 11/08/2012   BUN 13 11/08/2012   NA 138 11/08/2012   K 3.8 11/08/2012   CL 104 11/08/2012   CO2 26 11/08/2012    Lab Results  Component Value Date   HGBA1C 5.2 01/06/2010   Lipid Panel  Component Value Date/Time   CHOL 229* 11/30/2010 2034   TRIG 197* 11/30/2010 2034   HDL 48 11/30/2010 2034   CHOLHDL 4.8 Ratio 11/30/2010 2034   VLDL 39 11/30/2010 2034    LDLCALC 142* 11/30/2010 2034       Assessment and plan:   Patient Active Problem List   Diagnosis Date Noted  . HTN (hypertension) 05/06/2013  . Other and unspecified hyperlipidemia 05/06/2013    #1 hypertension: Continue lisinopril and HCTZ #2 hyperlipidemia: Continue pravastatin  Will need to return in 3 months - we'll need to order fasting lipid profile at that time

## 2013-07-12 ENCOUNTER — Emergency Department (HOSPITAL_COMMUNITY)
Admission: EM | Admit: 2013-07-12 | Discharge: 2013-07-12 | Disposition: A | Discharge status: Home / Self Care | Payer: Self-pay | Attending: Emergency Medicine | Admitting: Emergency Medicine

## 2013-07-12 ENCOUNTER — Encounter (HOSPITAL_COMMUNITY): Payer: Self-pay

## 2013-07-12 ENCOUNTER — Emergency Department (HOSPITAL_COMMUNITY): Payer: Self-pay

## 2013-07-12 DIAGNOSIS — M5412 Radiculopathy, cervical region: Secondary | ICD-10-CM | POA: Insufficient documentation

## 2013-07-12 DIAGNOSIS — R112 Nausea with vomiting, unspecified: Secondary | ICD-10-CM | POA: Insufficient documentation

## 2013-07-12 DIAGNOSIS — M79609 Pain in unspecified limb: Secondary | ICD-10-CM | POA: Insufficient documentation

## 2013-07-12 DIAGNOSIS — E785 Hyperlipidemia, unspecified: Secondary | ICD-10-CM | POA: Insufficient documentation

## 2013-07-12 DIAGNOSIS — R569 Unspecified convulsions: Secondary | ICD-10-CM | POA: Insufficient documentation

## 2013-07-12 DIAGNOSIS — I10 Essential (primary) hypertension: Secondary | ICD-10-CM | POA: Insufficient documentation

## 2013-07-12 DIAGNOSIS — Z79899 Other long term (current) drug therapy: Secondary | ICD-10-CM | POA: Insufficient documentation

## 2013-07-12 LAB — RAPID URINE DRUG SCREEN, HOSP PERFORMED
Amphetamines: NOT DETECTED
Benzodiazepines: NOT DETECTED
Opiates: NOT DETECTED
Tetrahydrocannabinol: NOT DETECTED

## 2013-07-12 LAB — CBC WITH DIFFERENTIAL/PLATELET
Eosinophils Relative: 1 % (ref 0–5)
HCT: 42.8 % (ref 39.0–52.0)
Lymphocytes Relative: 19 % (ref 12–46)
Lymphs Abs: 1.6 10*3/uL (ref 0.7–4.0)
MCV: 89.2 fL (ref 78.0–100.0)
Monocytes Absolute: 0.5 10*3/uL (ref 0.1–1.0)
Monocytes Relative: 6 % (ref 3–12)
RBC: 4.8 MIL/uL (ref 4.22–5.81)
RDW: 12.7 % (ref 11.5–15.5)
WBC: 8.2 10*3/uL (ref 4.0–10.5)

## 2013-07-12 LAB — BASIC METABOLIC PANEL
CO2: 24 mEq/L (ref 19–32)
Calcium: 8.9 mg/dL (ref 8.4–10.5)
Creatinine, Ser: 0.64 mg/dL (ref 0.50–1.35)
Glucose, Bld: 111 mg/dL — ABNORMAL HIGH (ref 70–99)

## 2013-07-12 LAB — URINALYSIS, ROUTINE W REFLEX MICROSCOPIC
Glucose, UA: NEGATIVE mg/dL
Hgb urine dipstick: NEGATIVE
Ketones, ur: NEGATIVE mg/dL
Protein, ur: NEGATIVE mg/dL

## 2013-07-12 LAB — ETHANOL: Alcohol, Ethyl (B): <11 mg/dL (ref 0–11)

## 2013-07-12 LAB — POCT I-STAT TROPONIN I: Troponin i, poc: 0 ng/mL (ref 0.00–0.08)

## 2013-07-12 MED ORDER — PREDNISONE 10 MG PO TABS: 20.0000 mg | ORAL_TABLET | Freq: Every day | ORAL | Status: DC

## 2013-07-12 NOTE — ED Notes (Signed)
Family at the bedside.

## 2013-07-12 NOTE — ED Notes (Signed)
Lisa, PA at the bedside.  

## 2013-07-12 NOTE — ED Notes (Signed)
Lisa, PA back at the bedside.  

## 2013-07-12 NOTE — ED Notes (Signed)
Notified RN of CBG [id number] 

## 2013-07-12 NOTE — ED Provider Notes (Signed)
CSN: [id number]     Arrival date & time 07/12/13  7829 History   First MD Initiated Contact with Patient 07/12/13 0827     Chief Complaint  Patient presents with  . Seizures  . Emesis   (Consider location/radiation/quality/duration/timing/severity/associated sxs/prior Treatment) The history is provided by the patient, a relative and medical records.   Patient presents to the ED for a witnessed seizure prior to arrival. Patient states he was awakened at 1 AM with right arm pain, he took some of his pain medication and went back to sleep. At a 700 he began having nausea with an episode of emesis.  Family states that he fell, began to tremble, eyes rolled backwards, and he had an episode of urinary incontinence.  Daughter states sx lasted in total for 1 minute.  Per family, pt was "groggy" afterwards and unaware of what just happened.  Family denies any head trauma.  No hx of seizure disorder or prior seizures.  On arrival, pt states he feels fine but still has some paresthesias of right arm which is not of new onset.  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 Patient is right-hand dominant. Has seen his PCP about this before and was put on steroids which helped sx.  Denies any numbness or weakness. No recent illness, fevers, sweats, or chills.  No abdominal pain.  No chest pain or SOB.  Pt AAOx3 on arrival, VS stable.    Past Medical History  Diagnosis Date  . Hypertension   . Hyperlipidemia    History reviewed. No pertinent past surgical history. History reviewed. No pertinent family history. History  Substance Use Topics  . Smoking status: Never Smoker   . Smokeless tobacco: Not on file  . Alcohol Use: Yes     Comment: occasonally    Review of Systems  Neurological: Positive for seizures.  All other systems reviewed and are negative.    Allergies  Review of patient's allergies indicates no known allergies.  Home Medications   Current Outpatient Rx  Name   Route  Sig  Dispense  Refill  . lisinopril (PRINIVIL,ZESTRIL) 10 MG tablet   Oral   Take 1 tablet (10 mg total) by mouth daily.   30 tablet   6   . pravastatin (PRAVACHOL) 20 MG tablet   Oral   Take 1 tablet (20 mg total) by mouth daily.   30 tablet   6    BP 105/61  Pulse 53  Temp(Src) 97.4 F (36.3 C) (Oral)  Resp 16  SpO2 97%  Physical Exam  Nursing note and vitals reviewed. Constitutional: He is oriented to person, place, and time. He appears well-developed and well-nourished. No distress.  HENT:  Head: Normocephalic and atraumatic. Head is without raccoon's eyes, without contusion and without laceration.  Right Ear: Tympanic membrane and ear canal normal.  Left Ear: Tympanic membrane and ear canal normal.  Nose: Nose normal.  Mouth/Throat: Uvula is midline, oropharynx is clear and moist and mucous membranes are normal. No oral lesions. No lacerations. No oropharyngeal exudate, posterior oropharyngeal edema, posterior oropharyngeal erythema or tonsillar abscesses.  No visible signs of head trauma; No dental or mucosal injury  Eyes: Conjunctivae and EOM are normal. Pupils are equal, round, and reactive to light.  Neck: Normal range of motion. Neck supple. No rigidity.  No meningeal signs  Cardiovascular: Normal rate, regular rhythm and normal heart sounds.   Pulmonary/Chest: Effort normal and breath sounds normal. No respiratory distress. He has  no wheezes.  Abdominal: Soft. Bowel sounds are normal. There is no tenderness. There is no guarding.  Musculoskeletal: Normal range of motion. He exhibits no edema.  Neurological: He is alert and oriented to person, place, and time. He has normal strength. He displays no tremor. No cranial nerve deficit or sensory deficit. He displays no seizure activity. Gait normal.  CN grossly intact, moves all extremities appropriately without ataxia, no focal neuro deficits or facial droop appreciated; no tremors or seizure activity  Skin: Skin  is warm and dry. He is not diaphoretic.  Psychiatric: He has a normal mood and affect.    ED Course  Procedures (including critical care time)   Date: 07/12/2013  Rate: 53  Rhythm: sinus bradycardia  QRS Axis: normal  Intervals: normal  ST/T Wave abnormalities: normal  Conduction Disutrbances:none  Narrative Interpretation:   Old EKG Reviewed: none available   Labs Review Labs Reviewed  CBC WITH DIFFERENTIAL - Abnormal; Notable for the following:    MCHC 36.7 (*)    All other components within normal limits  BASIC METABOLIC PANEL - Abnormal; Notable for the following:    Sodium 134 (*)    Glucose, Bld 111 (*)    All other components within normal limits  URINALYSIS, ROUTINE W REFLEX MICROSCOPIC  URINE RAPID DRUG SCREEN (HOSP PERFORMED)  ETHANOL  POCT I-STAT TROPONIN I   Imaging Review Ct Head Wo Contrast  07/12/2013   *RADIOLOGY REPORT*  Clinical Data:  Seizure  CT HEAD WITHOUT CONTRAST  Technique:  Contiguous axial images were obtained from the base of the skull through the vertex without contrast  Comparison:  06/21/2008  Findings:  The brain has a normal appearance without evidence for hemorrhage, acute infarction, hydrocephalus, or mass lesion.  There is no extra axial fluid collection.  The skull and paranasal sinuses are normal.  IMPRESSION: Normal CT of the head without contrast.   Original Report Authenticated By: Janeece Riggers, M.D.    MDM   1. Seizure   2. Cervical radiculopathy     EKG sinus brady, no acute ischemic changes.  Trop negative.  CT head negative.  Labs as above-- ethanol and uds negative.  Pt with seizure vs syncopal episode-- pt non-toxic appearing, NAD, no further seizure activity, tremors, or vomiting, VS remained stable.  Discussed with Dr. Effie Shy-- feels pt is ok for OP FU with neurology for EEG.  Pt requests prednisone as this helped his arm pain last time, will give low dose x1 week.  Discussed plan with pt and family, they acknowledged  understanding and agreed with plan.    Garlon Hatchet, PA-C 07/12/13 1203

## 2013-07-12 NOTE — ED Notes (Signed)
Per [facility], woke up at 0100 with right sided arm pain. At 0700 started having nausea. Family witnessed pt shake all over and eyes roll back into head. No hx of seizures. CBG 120, VSS, sinus brady on monitor. Given zofran 4 mg in route for vomiting with EMS. 18g to [facility]

## 2013-07-13 NOTE — ED Provider Notes (Signed)
Medical screening examination/treatment/procedure(s) were performed by non-physician practitioner and as supervising physician I was immediately available for consultation/collaboration.  Jeylin Woodmansee L Caleb Decock, MD 07/13/13 1000 

## 2013-07-15 ENCOUNTER — Ambulatory Visit: Payer: Self-pay

## 2013-07-19 ENCOUNTER — Ambulatory Visit: Payer: No Typology Code available for payment source | Attending: Internal Medicine | Admitting: Internal Medicine

## 2013-07-19 ENCOUNTER — Ambulatory Visit: Payer: Self-pay

## 2013-07-19 ENCOUNTER — Ambulatory Visit: Payer: No Typology Code available for payment source | Attending: Internal Medicine

## 2013-07-19 VITALS — BP 129/79 | HR 64 | Temp 97.7°F | Resp 16

## 2013-07-19 DIAGNOSIS — R569 Unspecified convulsions: Secondary | ICD-10-CM | POA: Insufficient documentation

## 2013-07-19 DIAGNOSIS — M79601 Pain in right arm: Secondary | ICD-10-CM

## 2013-07-19 DIAGNOSIS — M79609 Pain in unspecified limb: Secondary | ICD-10-CM

## 2013-07-19 DIAGNOSIS — I10 Essential (primary) hypertension: Secondary | ICD-10-CM | POA: Insufficient documentation

## 2013-07-19 DIAGNOSIS — E785 Hyperlipidemia, unspecified: Secondary | ICD-10-CM | POA: Insufficient documentation

## 2013-07-19 MED ORDER — NAPROXEN 500 MG PO TABS: 500.0000 mg | ORAL_TABLET | Freq: Two times a day (BID) | ORAL | Status: DC

## 2013-07-19 MED ORDER — ACETAMINOPHEN 500 MG PO TABS: 500.0000 mg | ORAL_TABLET | Freq: Four times a day (QID) | ORAL | Status: DC | PRN

## 2013-07-19 NOTE — Progress Notes (Signed)
Patient is follow up from some type of seizure activity Teeth clenched and was foaming from his mouth 2nd time this has happened

## 2013-07-19 NOTE — Progress Notes (Signed)
Patient ID: Shawn Porter, male   DOB: [birth year], 42 y.o.   MRN: [id number] Patient Demographics  Shawn Porter, is a 42 y.o. male  [id number]  [id number]  DOB - 1971-04-24  Chief Complaint  Patient presents with  . Follow-up        Subjective:   Shawn Porter today is here for a follow up visit. Patient was recently seen in the ER for seizure. His wife however reports that this could be his second seizure, patient had episode of teeth clenching with foaming from his mouth, almost 8 months ago. Patient has No headache, No chest pain, No abdominal pain - No Nausea,  No Cough - SOB.  Patient also reports neck pain/right arm pain, no shoulder pain, range of movement normal  Objective:    Filed Vitals:   07/19/13 1149  BP: 129/79  Pulse: 64  Temp: 97.7 F (36.5 C)  Resp: 16  SpO2: 96%     ALLERGIES:  No Known Allergies  PAST MEDICAL HISTORY: Past Medical History  Diagnosis Date  . Hypertension   . Hyperlipidemia     MEDICATIONS AT HOME: Prior to Admission medications   Medication Sig Start Date End Date Taking? Authorizing Provider  HYDROcodone-acetaminophen (NORCO/VICODIN) 5-325 MG per tablet Take 1 tablet by mouth every 6 (six) hours as needed for pain.    Historical Provider, MD  lisinopril (PRINIVIL,ZESTRIL) 10 MG tablet Take 1 tablet (10 mg total) by mouth daily. 05/06/13   Calvert Cantor, MD  pravastatin (PRAVACHOL) 20 MG tablet Take 1 tablet (20 mg total) by mouth daily. 05/06/13   Calvert Cantor, MD  predniSONE (DELTASONE) 10 MG tablet Take 2 tablets (20 mg total) by mouth daily. 07/12/13   Garlon Hatchet, PA-C     Exam  General appearance :Awake, alert, NAD, Speech Clear.  HEENT: Atraumatic and Normocephalic, PERLA Neck: supple, no JVD. No cervical lymphadenopathy.  Chest: Clear to auscultation bilaterally, no wheezing, rales or rhonchi CVS: S1 S2 regular, no murmurs.  Abdomen: soft, NBS, NT, ND, no gaurding, rigidity or  rebound. Extremities: no cyanosis or clubbing, B/L Lower Ext shows no edema Neurology: Awake alert, and oriented X 3, CN II-XII intact, Non focal Skin: No Rash or lesions Wounds:N/A    Data Review   Basic Metabolic Panel: No results found for this basename: NA, K, CL, CO2, GLUCOSE, BUN, CREATININE, CALCIUM, MG, PHOS,  in the last 168 hours Liver Function Tests: No results found for this basename: AST, ALT, ALKPHOS, BILITOT, PROT, ALBUMIN,  in the last 168 hours  CBC: No results found for this basename: WBC, NEUTROABS, HGB, HCT, MCV, PLT,  in the last 168 hours  ------------------------------------------------------------------------------------------------------------------ No results found for this basename: HGBA1C,  in the last 72 hours ------------------------------------------------------------------------------------------------------------------ No results found for this basename: CHOL, HDL, LDLCALC, TRIG, CHOLHDL, LDLDIRECT,  in the last 72 hours ------------------------------------------------------------------------------------------------------------------ No results found for this basename: TSH, T4TOTAL, FREET3, T3FREE, THYROIDAB,  in the last 72 hours ------------------------------------------------------------------------------------------------------------------ No results found for this basename: VITAMINB12, FOLATE, FERRITIN, TIBC, IRON, RETICCTPCT,  in the last 72 hours  Coagulation profile  No results found for this basename: INR, PROTIME,  in the last 168 hours    Assessment & Plan   Active Problems:  Hypertension Currently stable, continue lisinopril, he has refills  Hyperlipidemia- Continue pravastatin, he has refills  Seizure: Possibly a second episode - Will send ambulatory referal neurology, he will need EEG and further workup  Neck pain where the right arm pain  -  Does not appear to be orthopedic problem. Patient also reports that he was given  prednisone in the ER which also helped his right arm. We'll get MRI of the cervical spine to rule out any stenosis/DJD, bulging disc   Recommendations: followup in one-month after the MRI   Follow-up in 1 month     RAI,RIPUDEEP M.D. 07/19/2013, 12:03 PM

## 2013-08-02 DIAGNOSIS — G40909 Epilepsy, unspecified, not intractable, without status epilepticus: Secondary | ICD-10-CM | POA: Insufficient documentation

## 2013-08-06 ENCOUNTER — Ambulatory Visit: Payer: No Typology Code available for payment source | Attending: Internal Medicine

## 2013-08-20 ENCOUNTER — Ambulatory Visit: Payer: No Typology Code available for payment source

## 2013-09-03 ENCOUNTER — Encounter: Payer: Self-pay | Admitting: Internal Medicine

## 2013-09-03 ENCOUNTER — Ambulatory Visit (HOSPITAL_COMMUNITY)
Admission: RE | Admit: 2013-09-03 | Discharge: 2013-09-03 | Disposition: A | Discharge status: Home / Self Care | Payer: No Typology Code available for payment source | Source: Ambulatory Visit | Attending: Internal Medicine | Admitting: Internal Medicine

## 2013-09-03 ENCOUNTER — Ambulatory Visit: Payer: No Typology Code available for payment source | Attending: Internal Medicine | Admitting: Internal Medicine

## 2013-09-03 VITALS — BP 136/84 | HR 62 | Temp 98.1°F | Resp 14

## 2013-09-03 DIAGNOSIS — R209 Unspecified disturbances of skin sensation: Secondary | ICD-10-CM | POA: Insufficient documentation

## 2013-09-03 DIAGNOSIS — M79601 Pain in right arm: Secondary | ICD-10-CM

## 2013-09-03 DIAGNOSIS — M79609 Pain in unspecified limb: Secondary | ICD-10-CM | POA: Insufficient documentation

## 2013-09-03 DIAGNOSIS — R569 Unspecified convulsions: Secondary | ICD-10-CM

## 2013-09-03 DIAGNOSIS — I10 Essential (primary) hypertension: Secondary | ICD-10-CM

## 2013-09-03 DIAGNOSIS — E785 Hyperlipidemia, unspecified: Secondary | ICD-10-CM

## 2013-09-03 DIAGNOSIS — M25519 Pain in unspecified shoulder: Secondary | ICD-10-CM | POA: Insufficient documentation

## 2013-09-03 NOTE — Progress Notes (Signed)
Pt is here for a follow up, last visit 2 months ago. CC of numbness in RT arm from shoulder to finger tips. P=8, RT arm hurts at night, having trouble sleeping.

## 2013-09-03 NOTE — Patient Instructions (Addendum)
Not to drive, not to participate in high-speed sports, not operate heavy machinery, not to participate in activities at height- until cleared by neurology.   Convulsiones en el adulto  (Seizure, Adult)  Neomia Dear convulsin es una actividad elctrica anormal en el cerebro. Las convulsiones pueden causar una modificacin en la atencin o el comportamiento (estado mental alterado). Consisten en tener sacudidas de manera incontrolable. (convulsiones). Generalmente duran entre 30 segundos y 2 minutos. La epilepsia es un trastorno del cerebro en el que el paciente sufre convulsiones repetidas en el tiempo.  CAUSAS  Hay muchos problemas diferentes que pueden causar convulsiones. En algunos casos, no se encuentra la causa. Las causas ms comunes de convulsiones son:   Advertising account planner cabeza.  Tumores cerebrales  Infecciones.  Desequilibrio de las sustancias qumicas en la sangre.  Insuficiencia renal o insuficiencia heptica.  Enfermedades cardacas.  El abuso de drogas.  Ictus.  La abstinencia de ciertas drogas o el alcohol.  Defectos de nacimiento.  Mal funcionamiento de un dispositivo neuroquirrgico colocado en el cerebro. SNTOMAS  Los sntomas varan dependiendo de la parte del cerebro que est implicado. Justo antes de un ataque, puede tener una advertencia (aura) que indica que el ataque est a punto de Radiation protection practitioner. Un aura puede incluir los siguientes sntomas:   [place] o ansiedad.  Nuseas.  Sentir que la habitacin da vueltas (vrtigo).  Cambios en la visin, como ver destellos de luz o [place]. Los sntomas ms comunes durante un ataque son:   Convulsiones.  Babeo.  Movimientos rpidos de los ojos.  Gruidos.  Prdida del control del intestino y la vejiga.  Sabor amargo en la boca. Despus de un ataque, puede ser que se sienta confundido y adormilado. Tambin podra sufrir una lesin durante la convulsin.  DIAGNSTICO  Su mdico le har un examen fsico y algunas  pruebas para determinar el tipo y la causa de sus convulsiones. Estas pruebas pueden incluir:   Anlisis de sangre.  Una puncin lumbar. En esta prueba se extrae una pequea cantidad de lquido de la columna vertebral y se examina.  Electrocardiograma (ECG). Esta prueba registra la actividad elctrica del corazn.  Pruebas de diagnstico por imagen, como la tomografa computada (TC) o la resonancia magntica (MRI).  Electroencefalograma (EEG). Esta prueba registra la actividad elctrica del cerebro. TRATAMIENTO  Las convulsiones habitualmente cesan en forma espontnea. El tratamiento depender de la causa de la convulsin. En algunos casos se recetan medicamentos pare prevenir futuras convulsiones.  INSTRUCCIONES PARA EL CUIDADO EN EL HOGAR   Si le recetaron medicamentos, tmelos exactamente segn se lo indique su mdico.  Cumpla con todas las visitas de control, segn le indique su mdico.  No practique natacin ni conduzca vehculos hasta que su mdico le diga que puede [place].  Ensee a sus amigos y familiares lo que debe hacer si tiene una convulsin. Ellos deben:  Acostarlo en el suelo para evitar que se caiga.  Poner una almohada debajo de su cabeza.  Aflojar la ropa apretada alrededor de su cuello.  Recostarlo sobre un lado. En caso de tener vmitos, esto ayuda a [employer] vas area despejadas.  Lennie Hummer con usted hasta que se recupere. SOLICITE ATENCIN MDICA DE INMEDIATO SI:   La convulsin dura ms de 2 a 5 minutos.  El ataque es grave o la persona no se despierta despus de la convulsin.  El estado mental est alterado. Lleve a la persona al servicio de urgencias o llame a los servicios de Associate Professor locales ( 911  en los [place]). ASEGRESE DE QUE:   Comprende estas instrucciones.  Controlar su enfermedad.  Solicitar ayuda de inmediato si no mejora o si empeora. Document Released: 07/27/2005 Document Revised: 01/09/2012 [facility] Patient  Information 2014 [place], [place].

## 2013-09-03 NOTE — Progress Notes (Signed)
Patient Demographics  Shawn Porter, is a 42 y.o. male  [id number]  [id number]  DOB - 1970-12-15  Chief Complaint  Patient presents with  . Follow-up    Pain in RT arm        Subjective:   Shawn Porter today is here for a follow up visit. Patient has a history of hypertension, dyslipidemia and seizure disorder. He was recently seen by Dr. Modesto Charon of neurology in [facility] and prescribed Tegretol, unfortunately, patient has not taken any of the prescribed medication. Patient's only complaint today is chronic right shoulder area pain and right arm pain that he has been having for approximately one year. It is severe mostly at night.   Patient has No headache, No chest pain, No abdominal pain - No Nausea, No new weakness tingling or numbness, No Cough - SOB.   Objective:    Filed Vitals:   09/03/13 1021  BP: 136/84  Pulse: 62  Temp: 98.1 F (36.7 C)  Resp: 14  SpO2: 98%     ALLERGIES:  No Known Allergies  PAST MEDICAL HISTORY: Past Medical History  Diagnosis Date  . Hypertension   . Hyperlipidemia     MEDICATIONS AT HOME: Prior to Admission medications   Medication Sig Start Date End Date Taking? Authorizing Provider  acetaminophen (TYLENOL) 500 MG tablet Take 1 tablet (500 mg total) by mouth every 6 (six) hours as needed for pain. 07/19/13  Yes Ripudeep Jenna Luo, MD  lisinopril (PRINIVIL,ZESTRIL) 10 MG tablet Take 1 tablet (10 mg total) by mouth daily. 05/06/13  Yes Calvert Cantor, MD  naproxen (NAPROSYN) 500 MG tablet Take 1 tablet (500 mg total) by mouth 2 (two) times daily with a meal. 07/19/13  Yes Ripudeep K Rai, MD  pravastatin (PRAVACHOL) 20 MG tablet Take 1 tablet (20 mg total) by mouth daily. 05/06/13  Yes Calvert Cantor, MD  predniSONE (DELTASONE) 10 MG tablet Take 2 tablets (20 mg total) by mouth daily. 07/12/13  Yes Garlon Hatchet, PA-C     Exam  General appearance :Awake, alert, not in any distress. Speech Clear. Not toxic Looking HEENT:  Atraumatic and Normocephalic, pupils equally reactive to light and accomodation Neck: supple, no JVD. No cervical lymphadenopathy.  Chest:Good air entry bilaterally, no added sounds  CVS: S1 S2 regular, no murmurs.  Abdomen: Bowel sounds present, Non tender and not distended with no gaurding, rigidity or rebound. Extremities: B/L Lower Ext shows no edema, both legs are warm to touch Neurology: Awake alert, and oriented X 3, CN II-XII intact, Non focal Skin:No Rash Wounds:N/A    Data Review   CBC No results found for this basename: WBC, HGB, HCT, PLT, MCV, MCH, MCHC, RDW, NEUTRABS, LYMPHSABS, MONOABS, EOSABS, BASOSABS, BANDABS, BANDSABD,  in the last 168 hours  Chemistries   No results found for this basename: NA, K, CL, CO2, GLUCOSE, BUN, CREATININE, GFRCGP, CALCIUM, MG, AST, ALT, ALKPHOS, BILITOT,  in the last 168 hours ------------------------------------------------------------------------------------------------------------------ No results found for this basename: HGBA1C,  in the last 72 hours ------------------------------------------------------------------------------------------------------------------ No results found for this basename: CHOL, HDL, LDLCALC, TRIG, CHOLHDL, LDLDIRECT,  in the last 72 hours ------------------------------------------------------------------------------------------------------------------ No results found for this basename: TSH, T4TOTAL, FREET3, T3FREE, THYROIDAB,  in the last 72 hours ------------------------------------------------------------------------------------------------------------------ No results found for this basename: VITAMINB12, FOLATE, FERRITIN, TIBC, IRON, RETICCTPCT,  in the last 72 hours  Coagulation profile  No results found for this basename: INR, PROTIME,  in the last 168 hours    Assessment &  Plan   Hypertension - Controlled with lisinopril, continue  Hyperlipidemia - Continue with statin  Seizure disorder -  Patient asked not to drive (see patient instructions), recommended that he follow his neurologist instruction and continue with Tegretol which she has not even started. He saw urology approximately a month and a half ago. He has a follow up appointment in January, which she has been asked to keep.  Right arm pain/shoulder pain - Chronic issue - Will get x-ray of the C-spine and x-ray of the shoulder - Will refer to sports medicine  Follow up in 6 weeks  The patient was given clear instructions to go to ER or return to medical center if symptoms don't improve, worsen or new problems develop. The patient verbalized understanding. The patient was told to call to get lab results if they haven't heard anything in the next week.

## 2013-09-11 ENCOUNTER — Ambulatory Visit: Payer: No Typology Code available for payment source | Admitting: Emergency Medicine

## 2013-09-17 ENCOUNTER — Emergency Department (INDEPENDENT_AMBULATORY_CARE_PROVIDER_SITE_OTHER)
Admission: EM | Admit: 2013-09-17 | Discharge: 2013-09-17 | Disposition: A | Discharge status: Home / Self Care | Payer: No Typology Code available for payment source | Source: Home / Self Care | Attending: Emergency Medicine | Admitting: Emergency Medicine

## 2013-09-17 ENCOUNTER — Encounter (HOSPITAL_COMMUNITY): Payer: Self-pay | Admitting: Emergency Medicine

## 2013-09-17 DIAGNOSIS — L0291 Cutaneous abscess, unspecified: Secondary | ICD-10-CM

## 2013-09-17 MED ORDER — SULFAMETHOXAZOLE-TMP DS 800-160 MG PO TABS: 2.0000 | ORAL_TABLET | Freq: Two times a day (BID) | ORAL | Status: DC

## 2013-09-17 MED ORDER — HYDROCODONE-ACETAMINOPHEN 5-325 MG PO TABS: ORAL_TABLET | ORAL | Status: DC

## 2013-09-17 NOTE — ED Provider Notes (Signed)
Chief Complaint:   Chief Complaint  Patient presents with  . Abscess    History of Present Illness:    Shawn Porter is  A 42 year old male who has had a three-day history of a painful abscess on his upper back. This is draining pus and blood. He's had several cysts on his upper back for years which had not bothered him up until right now. He denies any history of abscesses, skin infections, boils, or MRSA in the past. No fever or chills.  Review of Systems:  Other than noted above, the patient denies any of the following symptoms: Systemic:  No fever, chills or sweats. Skin:  No rash or itching.  PMFSH:  Past medical history, family history, social history, meds, and allergies were reviewed.  No history of diabetes or prior history of abscesses or MRSA. He has high blood pressure and hypercholesterolemia. He takes lisinopril and pravastatin.  Physical Exam:   Vital signs:  BP 125/86  Pulse 68  Temp(Src) 98 F (36.7 C) (Oral)  Resp 14  SpO2 99% Skin:  He has a 2 x 2 centimeter raised, red, tender nodule on his upper back. This was not draining a blood or pus. He has several other smaller cysts on his upper back which were not inflamed.  Skin exam was otherwise normal.  No rash. Ext:  Distal pulses were full, patient has full ROM of all joints.  Procedure:  Verbal informed consent was obtained.  The patient was informed of the risks and benefits of the procedure and understands and accepts.  Identity of the patient was verified verbally and by wristband.   The abscess area described above was prepped with Betadine and alcohol and anesthetized with 3 mL of 2% Xylocaine with epinephrine.  Using a #11 scalpel blade, a singe straight incision was made into the area of fluctulence, yielding a large amount of prurulent drainage and squamous debris. The majority of the squamous debris was evacuated from the cyst, although I did not think I was able to completely evacuate.  Routine cultures  were obtained.  Blunt dissection was used to break up loculations and the resulting wound cavity was packed with 1/4 inch Iodoform gauze.  A sterile pressure dressing was applied.  Assessment:  The encounter diagnosis was Abscess.  This arose an infected sebaceous cyst. He hasn't several other sebaceous cyst on his upper back. I suggested that he have these removed by a surgeon.  Plan:   1.  Meds:  The following meds were prescribed:   Discharge Medication List as of 09/17/2013  9:12 PM    START taking these medications   Details  HYDROcodone-acetaminophen (NORCO/VICODIN) 5-325 MG per tablet 1 to 2 tabs every 4 to 6 hours as needed for pain., Print    sulfamethoxazole-trimethoprim (BACTRIM DS) 800-160 MG per tablet Take 2 tablets by mouth 2 (two) times daily., Starting 09/17/2013, Until Discontinued, Normal        2.  Patient Education/Counseling:  The patient was given appropriate handouts, self care instructions, and instructed in symptomatic relief.    3.  Follow up:  The patient was instructed to leave the dressing in place and return here again in 48 hours for packing removal, if becoming worse in any way, and given some red flag symptoms such as fever which would prompt immediate return.  Follow up with Dr. Avel Peace for removal of the remainder of the cysts.     Reuben Likes, MD 09/17/13 2227

## 2013-09-17 NOTE — ED Notes (Signed)
C/o upper back pain onset 3 days ago.  He noted 2 abscesses to upper mid back.  No hx. of same.  One started draining 3 days ago.

## 2013-09-17 NOTE — ED Notes (Signed)
Call back number verified.  

## 2013-09-19 ENCOUNTER — Encounter (HOSPITAL_COMMUNITY): Payer: Self-pay | Admitting: Emergency Medicine

## 2013-09-19 ENCOUNTER — Emergency Department (INDEPENDENT_AMBULATORY_CARE_PROVIDER_SITE_OTHER)
Admission: EM | Admit: 2013-09-19 | Discharge: 2013-09-19 | Disposition: A | Discharge status: Home / Self Care | Payer: No Typology Code available for payment source | Source: Home / Self Care | Attending: Family Medicine | Admitting: Family Medicine

## 2013-09-19 DIAGNOSIS — Z4800 Encounter for change or removal of nonsurgical wound dressing: Secondary | ICD-10-CM

## 2013-09-19 NOTE — ED Notes (Signed)
Here for packing removal.  No problems.

## 2013-09-19 NOTE — ED Provider Notes (Signed)
CSN: [id number]     Arrival date & time 09/19/13  1951 History   First MD Initiated Contact with Patient 09/19/13 2039     Chief Complaint  Patient presents with  . Wound Check   (Consider location/radiation/quality/duration/timing/severity/associated sxs/prior Treatment) Patient is a 42 y.o. male presenting with wound check. The history is provided by the patient and the spouse.  Wound Check This is a new problem. The current episode started 2 days ago.    Past Medical History  Diagnosis Date  . Hypertension   . Hyperlipidemia    No past surgical history on file. No family history on file. History  Substance Use Topics  . Smoking status: Never Smoker   . Smokeless tobacco: Not on file  . Alcohol Use: Yes     Comment: occasonally    Review of Systems  Constitutional: Negative.   Skin: Positive for wound.    Allergies  Review of patient's allergies indicates no known allergies.  Home Medications   Current Outpatient Rx  Name  Route  Sig  Dispense  Refill  . acetaminophen (TYLENOL) 500 MG tablet   Oral   Take 1 tablet (500 mg total) by mouth every 6 (six) hours as needed for pain.   60 tablet   3   . HYDROcodone-acetaminophen (NORCO/VICODIN) 5-325 MG per tablet      1 to 2 tabs every 4 to 6 hours as needed for pain.   20 tablet   0   . lisinopril (PRINIVIL,ZESTRIL) 10 MG tablet   Oral   Take 1 tablet (10 mg total) by mouth daily.   30 tablet   6   . naproxen (NAPROSYN) 500 MG tablet   Oral   Take 1 tablet (500 mg total) by mouth 2 (two) times daily with a meal.   60 tablet   0   . pravastatin (PRAVACHOL) 20 MG tablet   Oral   Take 1 tablet (20 mg total) by mouth daily.   30 tablet   6   . predniSONE (DELTASONE) 10 MG tablet   Oral   Take 2 tablets (20 mg total) by mouth daily.   14 tablet   0   . sulfamethoxazole-trimethoprim (BACTRIM DS) 800-160 MG per tablet   Oral   Take 2 tablets by mouth 2 (two) times daily.   40 tablet   0    BP  122/76  Pulse 74  Temp(Src) 98.2 F (36.8 C) (Oral)  Resp 16  SpO2 100% Physical Exam  Nursing note and vitals reviewed. Constitutional: He is oriented to person, place, and time. He appears well-developed and well-nourished. No distress.  Neurological: He is alert and oriented to person, place, and time.  Skin: Skin is warm and dry.  Packing removed from upper back cyst, no drainage, 2nd cyst intact., no infection.    ED Course  Procedures (including critical care time) Labs Review Labs Reviewed - No data to display Imaging Review No results found.  EKG Interpretation    Date/Time:    Ventricular Rate:    PR Interval:    QRS Duration:   QT Interval:    QTC Calculation:   R Axis:     Text Interpretation:              MDM   1. Wound abscess, subsequent encounter        Linna Hoff, MD 09/19/13 2050

## 2013-09-20 ENCOUNTER — Ambulatory Visit: Payer: No Typology Code available for payment source

## 2013-09-21 LAB — CULTURE, ROUTINE-ABSCESS
Gram Stain: NONE SEEN
Special Requests: NORMAL

## 2013-10-02 ENCOUNTER — Ambulatory Visit: Payer: No Typology Code available for payment source | Admitting: Emergency Medicine

## 2013-10-16 ENCOUNTER — Ambulatory Visit (INDEPENDENT_AMBULATORY_CARE_PROVIDER_SITE_OTHER): Payer: No Typology Code available for payment source | Admitting: Emergency Medicine

## 2013-10-16 ENCOUNTER — Encounter: Payer: Self-pay | Admitting: Emergency Medicine

## 2013-10-16 VITALS — BP 129/84 | HR 72 | Wt 169.0 lb

## 2013-10-16 DIAGNOSIS — M792 Neuralgia and neuritis, unspecified: Secondary | ICD-10-CM | POA: Insufficient documentation

## 2013-10-16 DIAGNOSIS — IMO0002 Reserved for concepts with insufficient information to code with codable children: Secondary | ICD-10-CM

## 2013-10-16 NOTE — Assessment & Plan Note (Signed)
Prior x-rays were reviewed. An MRI was ordered by his internist in September however, this has not yet been done. Arrangements were made an MRI was scheduled today for December 23 at 2 PM. He will go to the MRI and make appointment followup afterwards. At that time we will discuss recommendations for treatment based on the results of the study.

## 2013-10-16 NOTE — Progress Notes (Signed)
Patient ID: Shawn Porter, male   DOB: Oct 08, 1971, 42 y.o.   MRN: [id number] Eight-month history of right upper shoulder and arm pain radiating down to his ring and PT fingers. Pain is intermittent. Describes pain as a numbness and tingling pain. Awakens him at night quite a bit. Denies any specific injury or inciting event. Has taken anti-inflammatories with moderate relief as well as hydrocodone with moderate relief. At times pain is severe at other times pain is only mild. Worse with lifting heavy objects.  No weakness. Has not dropped a heavy object secondary to this.  Pertinent past medical history: Hypertension, high cholesterol.  History of a questionable seizure disorder versus syncopal episode  Social history:  Nonsmoker, occasional alcohol  Review of systems: As per history of present illness otherwise negative   Examination: BP 129/84  Pulse 72  Wt 169 lb (76.658 kg) Well-developed well-nourished 42 year old male awake alert and oriented in no acute distress  Cervical spine: No tenderness to palpation No paraspinous muscle spasm Full range of motion Negative Spurling  Right trapezius with no tenderness  Right Shoulder: Inspection reveals no abnormalities, atrophy or asymmetry. Palpation is normal with no tenderness over AC joint or bicipital groove. ROM is full in all planes. Rotator cuff strength normal throughout. No signs of impingement with negative Neer and Hawkin's tests, empty can. Speeds and Yergason's tests normal. No labral pathology noted with negative Obrien's, negative clunk and good stability. Normal scapular function observed. No painful arc and no drop arm sign. No apprehension sign

## 2013-10-16 NOTE — Patient Instructions (Addendum)
MRI  [place] TUES 10/22/13 AT 2PM [address] [place] REGISTER IN ADMITTING AT 145PM [phone]

## 2013-10-22 ENCOUNTER — Ambulatory Visit (HOSPITAL_COMMUNITY): Admission: RE | Admit: 2013-10-22 | Payer: No Typology Code available for payment source | Source: Ambulatory Visit

## 2013-10-25 ENCOUNTER — Ambulatory Visit: Payer: No Typology Code available for payment source

## 2013-10-28 ENCOUNTER — Ambulatory Visit: Payer: No Typology Code available for payment source | Admitting: Internal Medicine

## 2013-10-30 ENCOUNTER — Ambulatory Visit: Payer: No Typology Code available for payment source | Admitting: Sports Medicine

## 2013-11-01 ENCOUNTER — Emergency Department (HOSPITAL_COMMUNITY)
Admission: EM | Admit: 2013-11-01 | Discharge: 2013-11-01 | Disposition: A | Discharge status: Home / Self Care | Payer: No Typology Code available for payment source | Attending: Emergency Medicine | Admitting: Emergency Medicine

## 2013-11-01 ENCOUNTER — Encounter (HOSPITAL_COMMUNITY): Payer: Self-pay | Admitting: Emergency Medicine

## 2013-11-01 DIAGNOSIS — E785 Hyperlipidemia, unspecified: Secondary | ICD-10-CM | POA: Insufficient documentation

## 2013-11-01 DIAGNOSIS — S0510XA Contusion of eyeball and orbital tissues, unspecified eye, initial encounter: Secondary | ICD-10-CM | POA: Insufficient documentation

## 2013-11-01 DIAGNOSIS — I10 Essential (primary) hypertension: Secondary | ICD-10-CM | POA: Insufficient documentation

## 2013-11-01 DIAGNOSIS — T2691XA Corrosion of right eye and adnexa, part unspecified, initial encounter: Secondary | ICD-10-CM

## 2013-11-01 DIAGNOSIS — Z79899 Other long term (current) drug therapy: Secondary | ICD-10-CM | POA: Insufficient documentation

## 2013-11-01 DIAGNOSIS — Y929 Unspecified place or not applicable: Secondary | ICD-10-CM | POA: Insufficient documentation

## 2013-11-01 DIAGNOSIS — Y9389 Activity, other specified: Secondary | ICD-10-CM | POA: Insufficient documentation

## 2013-11-01 DIAGNOSIS — T1590XA Foreign body on external eye, part unspecified, unspecified eye, initial encounter: Secondary | ICD-10-CM | POA: Insufficient documentation

## 2013-11-01 DIAGNOSIS — Z792 Long term (current) use of antibiotics: Secondary | ICD-10-CM | POA: Insufficient documentation

## 2013-11-01 MED ORDER — FLUORESCEIN SODIUM 1 MG OP STRP
1.0000 | ORAL_STRIP | Freq: Once | OPHTHALMIC | Status: AC
  Administered 2013-11-01: 1 via OPHTHALMIC
  Filled 2013-11-01: qty 1

## 2013-11-01 MED ORDER — TETRACAINE HCL 0.5 % OP SOLN
2.0000 [drp] | Freq: Once | OPHTHALMIC | Status: AC
  Administered 2013-11-01: 2 [drp] via OPHTHALMIC
  Filled 2013-11-01: qty 2

## 2013-11-01 MED ORDER — ERYTHROMYCIN 5 MG/GM OP OINT: TOPICAL_OINTMENT | OPHTHALMIC | Status: DC

## 2013-11-01 NOTE — Discharge Instructions (Signed)
Por favor llame y set-up una cita con el Dr. Prudencio Burly, Cathie Hoops, para ser visto y re-evaluado en [place], 11/04/2013. Por favor aplicar compresiones fras en el ojo para ayudar en el alivio Por favor, tome medicacin segn lo prescrito - aplique el ungento en el ojo derecho 2 veces al da Por favor, evite picar el ojo Por favor, seguir vigilando los sntomas y si los sntomas son a Copy o Quarry manager (fiebre de ms de 101, sitio de la inflamacin, visin borrosa, prdida repentina de la visin, lesiones en los ojos, manchas en el ojo, hinchazn, entumecimiento, hormigueo), por favor informe al el ED  Conjuntivitis por sustancias qumicas (Chemical Conjunctivitis) El profesional que lo asiste le ha diagnosticado que usted padece conjuntivitis producida por sustancias qumicas. Se trata de una irritacin de la zona interna del prpado y de la parte blanca del ojo. Las causas de la conjuntivitis pueden ser una infeccin, [place] o irritacin qumica. En su caso, el origen es una irritacin producida por un producto qumico. Casi siempre se asocia con lagrimeo, sensibilidad a la luz, sensacin (percepcin) de Geneticist, molecular ojo, hinchazn de los prpados, y con Glass blower/designer intenso. A pesar del dolor, se autolimita y podr mejorar en veinticuatro horas.  INSTRUCCIONES PARA EL CUIDADO DOMICILIARIO  Para aliviar la molestia, aplique una compresa limpia y fra en el ojo durante 10 a 20 minutos, 3 a 4 veces por da.  No se frote los ojos.  Limpie suavemente todas las secreciones del ojo con un tis hmedo.  Lvese las manos con frecuencia con jabn y use toallas de papel para secarse.  Puede utilizar anteojos para el sol si la [employer].  No utilice maquillaje.  No utilice lentes de contacto hasta que la irritacin haya desaparecido.  No opere maquinarias ni conduzca si su visin es borrosa.  Tome los [employer] como le indic el profesional que lo asiste. Las lgrimas artificiales  pueden causar molestias.  Evite las sustancias qumicas o los ambientes que le causaron el problema. Use proteccin en los ojos siempre que sea necesario. SOLICITE ATENCIN MDICA SI:  Si los ojos an estn irritados (inflamados) luego de 3 das de haber comenzado el tratamiento.  El [employer] ojos [place].  Usted presenta una secrecin que proviene de un ojo.  Sus prpados estn pegados por la [employer]  Tiene una mayor sensibilidad a Naval architect.  La temperatura oral se eleva sin motivo por encima de 38,9 C (77 F) o segn le indique el profesional que lo asiste.  Comienza a Academic librarian.  Tiene algn problema que pueda relacionarse con el medicamento que est tomando. SOLICITE ATENCIN MDICA DE INMEDIATO SI:  La visin es empeora.  Presenta dolor intenso en el ojo. EST SEGURO QUE:   Comprende las instrucciones para el alta mdica.  Controlar su enfermedad.  Solicitar atencin mdica de inmediato segn las indicaciones. Document Released: 07/27/2005 Document Revised: 01/09/2012 [facility] Patient Information 2014 [place], [place].   Emergency Department Resource Guide 1) Find a Doctor and Pay Out of Pocket Although you won't have to find out who is covered by your insurance plan, it is a good idea to ask around and get recommendations. You will then need to call the office and see if the doctor you have chosen will accept you as a new patient and what types of options they offer for patients who are self-pay. Some doctors offer discounts or will set up payment plans for their patients who do  not have insurance, but you will need to ask so you aren't surprised when you get to your appointment.  2) Contact Your Local Health Department Not all health departments have doctors that can see patients for sick visits, but many do, so it is worth a call to see if yours does. If you don't know where your local health department is, you can check in your phone book. The  CDC also has a tool to help you locate your state's health department, and many state websites also have listings of all of their local health departments.  3) Find a [facility] If your illness is not likely to be very severe or complicated, you may want to try a walk in clinic. These are popping up all over the country in pharmacies, drugstores, and shopping centers. They're usually staffed by nurse practitioners or physician assistants that have been trained to treat common illnesses and complaints. They're usually fairly quick and inexpensive. However, if you have serious medical issues or chronic medical problems, these are probably not your best option.  No Primary Care Doctor: - Call Health Connect at  [phone] - they can help you locate a primary care doctor that  accepts your insurance, provides certain services, etc. - Physician Referral Service- [phone]  Chronic Pain Problems: Organization         Address  Phone   Notes  [facility]  [phone] Patients need to be referred by their primary care doctor.   Medication Assistance: Organization         Address  Phone   Notes  [facility] Medication Person [facility] [address] [place], [place] [address] [phone] --Must be a resident of [facility] -- Must have NO insurance coverage whatsoever (no Medicaid/ Medicare, etc.) -- The pt. MUST have a primary care doctor that directs their care regularly and follows them in the community   MedAssist  [phone]   [employer]  [phone]    Agencies that provide inexpensive medical care: Organization         Address  Phone   Notes  [place]  [phone]   [facility]    [phone]   [facility] [place],  [address] [phone]   [place] [address], [place] [phone]   Planned Parenthood    [phone]   [facility]    [phone]   [place] and [address], [place] Phone:  [phone], Fax:  [phone] Hours of Operation:  9 am - 6 pm, M-F.  Also accepts Medicaid/Medicare and self-pay.  [facility] for [place] [address], [address], [place] Phone: [phone], Fax: [phone]. Hours of Operation:  8:30 am - 5:30 pm, M-F.  Also accepts Medicaid and self-pay.  [facility] [place] [address] [place] Phone: [phone]   [place], [place], [place] [phone], Ext. [phone] Mondays & Thursdays: 7-9 AM.  First 15 patients are seen on a first come, first serve basis.    [place] Providers:  Organization         Address  Phone   Notes  [facility] [address], [place] [phone] Also accepts self-pay patients.  [place] [phone]  [place], [place]  [phone]   [place], [address], [place] [phone]   [place] [address] [place] [phone]   Lucianne Lei [address], [place]   [phone] Only accepts [place] Access [place] patients after they have their name applied to their card.   Self-Pay (no insurance) in [facility]:  Organization         Address  Phone   Notes  Sickle Cell Patients, [facility] [place] [phone]   [facility] Urgent Care [place] [phone]   [facility] [place]  [place], [place], [place] [phone]   [facility]/Dr. Osei-Bonsu  [address] [place] or [address], [place] [phone] Phone number for both [place] and [place] locations is the same.  Urgent Medical and [facility] [address] [place] [phone]   [facility] [address], [place] or [address] [phone]   [facility] [address] [place] [phone], phone; [phone], fax Sees patients 1st and 3rd Saturday of every month.  Must not qualify for public or private insurance (i.e. Medicaid, Medicare, [place] Health Choice, Veterans' Benefits)  Household income should be no more than 200% of the poverty level The clinic cannot treat you if you are pregnant or think you are pregnant  Sexually transmitted diseases are not treated at the clinic.    Dental Care: Organization         Address  Phone  Notes  [facility] Department of [facility] [place] [phone] Accepts children up to age 43 who are enrolled in [place] or [place]; pregnant women with a Medicaid card; and children who have applied for Medicaid or [place] Health Choice, but were declined, whose parents can pay a reduced fee at time of service.  [facility] Department of [facility]  [address], [place] [phone] Accepts children up to age 19 who are enrolled in [place] or [place]; pregnant women with a Medicaid card; and children who have applied for Medicaid or [place] Health Choice, but were declined, whose parents can pay a reduced fee at time of service.  [place] Adult Dental Access PROGRAM  [place] [phone] Patients are seen by appointment only. Walk-ins are not accepted. [place] will see patients 37 years of age and older. Monday - Tuesday (8am-5pm) Most Wednesdays (8:30-5pm) $30 per visit, cash only  [facility] Adult Dental Access PROGRAM  [address], [facility] [phone] Patients are seen by appointment only. Walk-ins are not accepted. [place] will see patients 26 years of age and older. One  Wednesday Evening (Monthly: Volunteer Based).  $30 per visit, cash only  [place]  [phone] for adults; Children under age 61, call Graduate Pediatric Dentistry at [phone]. Children aged 54-14, please call [phone] to request a pediatric application.  Dental services are provided in all areas of dental care including fillings, crowns and bridges, complete and partial dentures, implants, gum treatment, root canals, and extractions. Preventive care is also provided. Treatment is provided to both adults and children. Patients are  selected via a lottery and there is often a waiting list.   [facility] [address] [place]  [phone] [email or web address]   [facility] [address] [place], [place] [phone], Ext. [phone] Second and Fourth Thursday of each month, opens at 6:30 AM; Clinic ends at 9 AM.  Patients are seen on a first-come first-served basis, and a limited number are seen during each clinic.   [facility]  [address] [place]ard Danker [place], [place] [phone]   Eligibility Requirements You must have lived in [place], [place], or [place] counties for at least the last three months.   You cannot be eligible for state or federal sponsored [employer], including [employer], [place], or [employer].   You generally cannot be eligible for healthcare insurance through your employer.    How to apply: Eligibility screenings are held every Tuesday and Wednesday afternoon from 1:00 pm until 4:00 pm. You do not need an appointment for the interview!  [facility] [address] [place], [place]   [place]  [place] Department  [place]  [phone]    Behavioral Health Resources in the Community: Intensive Outpatient Programs Organization          Address  Phone  Notes  [place]. [address], [place], [place] [phone]   [place] [facility] Outpatient [address] [place], [place]   ADS: Alcohol & Drug Svcs [address] [place], [place]   [place] [address]  [place], [place] or [phone]   Substance Abuse Resources Organization         Address  Phone  Notes  Alcohol and Drug Services  [phone]   [place]  [phone]   [facility] [place]   Chinita Pester  [phone]   Residential & Outpatient Substance Abuse Program  [phone]   Psychological Services Organization         Address  Phone  Notes  [facility] [place]  [place]  [phone]   [place] [address], [place] or [phone]    Mobile Crisis Teams Organization         Address  Phone  Notes  [facility]rapeutic Alternatives, Mobile Crisis Care Unit  [phone]   Assertive Psychotherapeutic Services  [address] [place], [place]   [place] [address], [place] [phone]    Self-Help/Support Groups Organization         Address  Phone             Notes  [place]. of [place] - variety of support groups  [place] Call for more information  Narcotics Anonymous (NA), [facility] [address], [employer] [place]  2 meetings at this location   Special educational needs teacher         Address  Phone  Notes  [facility]AP Residential Treatment [place],    [place]  [phone]   [facility]  [address] [place] [address], [place], [place]   [place], [place] [phone] Admissions: 8am-3pm M-F  Incentives Substance [place] [address]    [place], [place]   [facility] [address] Jacinto Reap [place], [place]   [facility] [address],  [place], [place]   [facility]ight Programs - Intensive Outpatient [address], [place], [place]   [facility] [address]  [place], [place] [phone] or [phone]   [facility]idential Treatment Services (RTS) [address], [place], [place] Accepts Medicaid  [facility] [place] [address],  [place] [phone] Substance Abuse/Addiction Treatment   [facility] Organization         Address  Phone  Notes  [facility]  [phone]   Domenic Schwab, PhD [address] [place]lis [place], [place]   [phone] or [phone]   [place], [place] [phone]   [place] [address], [place], [place] [phone] Insurance/Medicaid/sponsorship through [facility] and Families [address] [place]                                    [place], [place] [phone] [place] [address], [place] [phone]    Dr. Adele Schilder  [phone]   [facility] [place] [address] Dept. 1) [address] [place] 2) [place] 3)  [place] [address], [place] [phone]  [phone]   [place] [phone] or [phone] (After Hours)

## 2013-11-01 NOTE — ED Notes (Addendum)
Pt was mixing acid and vinegar to clean his chimney this afternoon and some splashed into his R eye. Pt c/o pain in his eye and there is a sensation that something is still in his eye. States his vision is normal. He splashed water into his eye several times afterwards to rinse the acid out

## 2013-11-01 NOTE — ED Notes (Signed)
Morgan lense placed in right eye with warm fluid. Pt tolerating well.

## 2013-11-01 NOTE — ED Provider Notes (Signed)
CSN: [id number]     Arrival date & time 11/01/13  1824 History  This chart was scribed for Jamse Mead, PA-C, working with Carmin Muskrat, MD, by Elby Beck ED Scribe. This patient was seen in room TR07C/TR07C and the patient's care was started at 9:45 PM.   Chief Complaint  Patient presents with  . Eye Injury    The history is provided by the patient. No language interpreter was used.    HPI Comments: Shawn Porter is a 43 y.o. male who presents to the Emergency Department complaining of a right eye injury that occurred earlier today. He states that he had made a homemade mixture of water, vinegar and acid (from a cleaning product) to clean his chimney, and that he accidentally splashed some of the mixture in his right eye. He reports "burning" pain to his right eye onset after the incident and he also states that he has the sensation that there is a "gritty" foreign body in his right eye. He further states that he has noticed his eye tearing some since the incident. He states that he rinsed his eye with water for about 3 minutes after the incident. He denies blurred vision, sudden loss of vision, dots in his vision, eye pressure, eye drainage, headache, dizziness, chest pain, SOB or difficulty breathing or any other symptoms.   Past Medical History  Diagnosis Date  . Hypertension   . Hyperlipidemia    History reviewed. No pertinent past surgical history. History reviewed. No pertinent family history. History  Substance Use Topics  . Smoking status: Never Smoker   . Smokeless tobacco: Not on file  . Alcohol Use: Yes     Comment: occasonally    Review of Systems  Eyes: Positive for pain ("burning", right eye). Negative for discharge and visual disturbance.  Respiratory: Negative for shortness of breath.   Cardiovascular: Negative for chest pain.  Neurological: Negative for dizziness and headaches.  All other systems reviewed and are negative.    Allergies  Review of  patient's allergies indicates no known allergies.  Home Medications   Current Outpatient Rx  Name  Route  Sig  Dispense  Refill  . carbamazepine (TEGRETOL) 200 MG tablet   Oral   Take 200 mg by mouth daily.         Marland Kitchen ibuprofen (ADVIL,MOTRIN) 200 MG tablet   Oral   Take 200-400 mg by mouth daily as needed (pain).         Marland Kitchen lisinopril (PRINIVIL,ZESTRIL) 10 MG tablet   Oral   Take 10 mg by mouth at bedtime.         . pravastatin (PRAVACHOL) 20 MG tablet   Oral   Take 20 mg by mouth at bedtime.         Marland Kitchen erythromycin ophthalmic ointment      Place a 1/2 inch ribbon of ointment into the lower eyelid two times a day.   1 g   0    Triage Vitals: BP 134/76  Pulse 68  Temp(Src) 97.9 F (36.6 C) (Oral)  Resp 16  Wt 166 lb 8 oz (75.524 kg)  SpO2 99%  Physical Exam  Nursing note and vitals reviewed. Constitutional: He is oriented to person, place, and time. He appears well-developed and well-nourished. No distress.  HENT:  Head: Normocephalic and atraumatic.  Mouth/Throat: Oropharynx is clear and moist. No oropharyngeal exudate.  Eyes: EOM and lids are normal. Pupils are equal, round, and reactive to light. Lids are everted  and swept, no foreign bodies found. Right eye exhibits chemosis. Right eye exhibits no discharge, no exudate and no hordeolum. No foreign body present in the right eye. Left eye exhibits no discharge, no exudate and no hordeolum. No foreign body present in the left eye. Right conjunctiva is injected. Right conjunctiva has no hemorrhage. Left conjunctiva is not injected. Left conjunctiva has no hemorrhage. Right eye exhibits normal extraocular motion and no nystagmus. Left eye exhibits normal extraocular motion and no nystagmus.  Fundoscopic exam:      The right eye shows no AV nicking, no exudate, no hemorrhage and no papilledema.       The left eye shows no AV nicking, no exudate, no hemorrhage and no papilledema.  Slit lamp exam:      The right eye  shows no corneal flare, no corneal ulcer, no foreign body, no hyphema, no fluorescein uptake and no anterior chamber bulge.  Mild injection noted to the sclera of the right eye Negative signs of entrapment Chemosis noted to the right eye - left aspect of the iris.  Negative nystagmus  Negative dendritic lesion, negative seidel's sign. Negative findings of corneal abrasion noted.   Neck: Normal range of motion. Neck supple.  Cardiovascular: Normal rate, regular rhythm and normal heart sounds.   Pulmonary/Chest: Effort normal and breath sounds normal. No respiratory distress. He has no wheezes. He has no rales.  Musculoskeletal: Normal range of motion.  Full ROM to upper and lower extremities without difficulty noted, negative ataxia noted  Lymphadenopathy:    He has no cervical adenopathy.  Neurological: He is alert and oriented to person, place, and time. He exhibits normal muscle tone. Coordination normal.  Skin: Skin is warm and dry. No rash noted. He is not diaphoretic. No erythema.  Psychiatric: He has a normal mood and affect. His behavior is normal. Thought content normal.    ED Course  Procedures (including critical care time)  DIAGNOSTIC STUDIES: Oxygen Saturation is 99% on RA, normal by my interpretation.    COORDINATION OF CARE: 9:50 PM- Discussed plan to perform visual acuity screening. Pt advised of plan for treatment and pt agrees.  11:26 PM Attending physician saw and assessed patient - Dr. Kirby Funk. As per physician recommended patient be started on antibiotics ointment of erythromycin and be seen by eye physician on Monday.   Medications  tetracaine (PONTOCAINE) 0.5 % ophthalmic solution 2 drop (2 drops Right Eye Given 11/01/13 2155)  fluorescein ophthalmic strip 1 strip (1 strip Right Eye Given 11/01/13 2155)   Labs Review Labs Reviewed - No data to display Imaging Review No results found.  EKG Interpretation   None       MDM   1. Chemical insult, eye,  right, initial encounter    Filed Vitals:   11/01/13 1901  BP: 134/76  Pulse: 68  Temp: 97.9 F (36.6 C)  Resp: 16   I personally performed the services described in this documentation, which was scribed in my presence. The recorded information has been reviewed and is accurate.  Patient presenting to emergency department with right eye pain that started this afternoon after the patient cleaned his chimney. Reported that while he was cleaning his chimney a small amount of fluid mixture of vinegar, water, cleaning products got into his right eye leading to a burning sensation. Reported that he has a foreign body sensation. Alert and oriented. GCS 15. Heart rate and rhythm normal. Lungs good auscultation to upper and lower lobes. Pulses palpable  and strong, radial 2+ bilaterally. Negative swelling identified to the right eye. Mild injection noted to the sclera of the right eye. Unremarkable findings to funduscopic exam. Discomfort upon palpation to the right eye. Negative swelling localized to the lids of the right eye. Negative uptake of the limb. Negative findings for corneal abrasion. Possible chemosis identified to the right eye. Negative dendritic lesion. Negative Seidel sign. Chemosis noted to the right eye - left aspect of the iris.  Doubt herpetic keratitis. Doubt corneal abrasion. 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 Patient seen and assessed by attending physician who cleared patient for discharge, recommended patient to followup with ophthalmologist on Monday and recommended to start patient on antibiotic treatment. Patient started on topical antibiotics. Patient stable, afebrile. Discharged patient. Referred patient ophthalmologist. Discussed with patient to closely monitor symptoms and if symptoms are to worsen or change to report back to the ED - strict return instructions given.  Patient agreed to plan of care, understood, all  questions answered.    [employer], PA-C 11/02/13 0321

## 2013-11-03 NOTE — ED Provider Notes (Signed)
  This was a shared visit with a mid-level provided (NP or PA).  Throughout the patient's course I was available for consultation/collaboration.  I saw the ECG (if appropriate), relevant labs and studies - I agree with the interpretation.  On my exam the patient was in no distress.  He had previously had his eye irrigated, not currently complaining of acuity changes.  Patient's presentation is concerning for chemical irritation.  Absent distress, and was preserved acuity, he was started on topical medication, discharged to follow up with ophthalmology.      Carmin Muskrat, MD 11/03/13 0003

## 2013-11-04 ENCOUNTER — Encounter: Payer: Self-pay | Admitting: Internal Medicine

## 2013-11-04 ENCOUNTER — Ambulatory Visit: Payer: No Typology Code available for payment source | Attending: Internal Medicine | Admitting: Internal Medicine

## 2013-11-04 VITALS — BP 121/77 | HR 63 | Temp 98.7°F | Resp 14 | Ht 65.0 in | Wt 164.6 lb

## 2013-11-04 DIAGNOSIS — G40909 Epilepsy, unspecified, not intractable, without status epilepticus: Secondary | ICD-10-CM | POA: Insufficient documentation

## 2013-11-04 DIAGNOSIS — I10 Essential (primary) hypertension: Secondary | ICD-10-CM | POA: Insufficient documentation

## 2013-11-04 DIAGNOSIS — M25519 Pain in unspecified shoulder: Secondary | ICD-10-CM

## 2013-11-04 DIAGNOSIS — E785 Hyperlipidemia, unspecified: Secondary | ICD-10-CM | POA: Insufficient documentation

## 2013-11-04 DIAGNOSIS — M25511 Pain in right shoulder: Secondary | ICD-10-CM

## 2013-11-04 MED ORDER — LISINOPRIL 10 MG PO TABS: 10.0000 mg | ORAL_TABLET | Freq: Every day | ORAL | Status: DC

## 2013-11-04 MED ORDER — CARBAMAZEPINE 200 MG PO TABS: 200.0000 mg | ORAL_TABLET | Freq: Every day | ORAL | Status: DC

## 2013-11-04 MED ORDER — PRAVASTATIN SODIUM 20 MG PO TABS: 20.0000 mg | ORAL_TABLET | Freq: Every day | ORAL | Status: DC

## 2013-11-04 MED ORDER — MELOXICAM 15 MG PO TABS: 15.0000 mg | ORAL_TABLET | Freq: Every day | ORAL | Status: DC

## 2013-11-04 NOTE — Progress Notes (Signed)
Patient ID: Shawn Porter, male   DOB: August 07, 1971, 43 y.o.   MRN: [id number]   CC:  HPI: 43 year old male presents with right shoulder pain, neck pain pain radiating down into his right hand. The patient also describes some numbness and tingling. Pain Awakens him at night. He has been taking ibuprofen and hydrocodone with moderate relief. He is unsure as to who prescribed and hydrocodone. He was scheduled to have an MRI twice but has not done so He also has a history of seizure disorder and is not taking his Tegretol for about a week. He states that he is not driving   No Known Allergies Past Medical History  Diagnosis Date  . Hypertension   . Hyperlipidemia    Current Outpatient Prescriptions on File Prior to Visit  Medication Sig Dispense Refill  . erythromycin ophthalmic ointment Place a 1/2 inch ribbon of ointment into the lower eyelid two times a day.  1 g  0  . ibuprofen (ADVIL,MOTRIN) 200 MG tablet Take 200-400 mg by mouth daily as needed (pain).       No current facility-administered medications on file prior to visit.   No family history on file. History   Social History  . Marital Status: Married    Spouse Name: N/A    Number of Children: N/A  . Years of Education: N/A   Occupational History  . Not on file.   Social History Main Topics  . Smoking status: Never Smoker   . Smokeless tobacco: Not on file  . Alcohol Use: Yes     Comment: occasonally  . Drug Use: No  . Sexual Activity: Not on file   Other Topics Concern  . Not on file   Social History Narrative  . No narrative on file    Review of Systems  Constitutional: As in history of present illness  HENT: Negative for ear pain, nosebleeds, congestion, facial swelling, rhinorrhea, neck pain, neck stiffness and ear discharge.   Eyes: Negative for pain, discharge, redness, itching and visual disturbance.  Respiratory: Negative for cough, choking, chest tightness, shortness of breath, wheezing and  stridor.   Cardiovascular: Negative for chest pain, palpitations and leg swelling.  Gastrointestinal: Negative for abdominal distention.  Genitourinary: Negative for dysuria, urgency, frequency, hematuria, flank pain, decreased urine volume, difficulty urinating and dyspareunia.  Musculoskeletal: As in history of present illness  Neurological: Negative for dizziness, tremors, seizures, syncope, facial asymmetry, speech difficulty, weakness, light-headedness, numbness and headaches.  Hematological: Negative for adenopathy. Does not bruise/bleed easily.  Psychiatric/Behavioral: Negative for hallucinations, behavioral problems, confusion, dysphoric mood, decreased concentration and agitation.    Objective:   Filed Vitals:   11/04/13 1453  BP: 121/77  Pulse: 63  Temp: 98.7 F (37.1 C)  Resp: 14    Physical Exam  Constitutional: Appears well-developed and well-nourished. No distress.  HENT: Normocephalic. External right and left ear normal. Oropharynx is clear and moist.  Eyes: Conjunctivae and EOM are normal. PERRLA, no scleral icterus.  Neck: Normal ROM. Neck supple. No JVD. No tracheal deviation. No thyromegaly.  CVS: RRR, S1/S2 +, no murmurs, no gallops, no carotid bruit.  Pulmonary: Effort and breath sounds normal, no stridor, rhonchi, wheezes, rales.  Abdominal: Soft. BS +,  no distension, tenderness, rebound or guarding.  Musculoskeletal: Normal range of motion. No edema and no tenderness.  Lymphadenopathy: No lymphadenopathy noted, cervical, inguinal. Neuro: Alert. Normal reflexes, muscle tone coordination. No cranial nerve deficit. Skin: Skin is warm and dry. No rash noted. Not  diaphoretic. No erythema. No pallor.  Psychiatric: Normal mood and affect. Behavior, judgment, thought content normal.   Lab Results  Component Value Date   WBC 8.2 07/12/2013   HGB 15.7 07/12/2013   HCT 42.8 07/12/2013   MCV 89.2 07/12/2013   PLT 205 07/12/2013   Lab Results  Component Value Date    CREATININE 0.64 07/12/2013   BUN 14 07/12/2013   NA 134* 07/12/2013   K 3.9 07/12/2013   CL 101 07/12/2013   CO2 24 07/12/2013    Lab Results  Component Value Date   HGBA1C 5.2 01/06/2010   Lipid Panel     Component Value Date/Time   CHOL 229* 11/30/2010 2034   TRIG 197* 11/30/2010 2034   HDL 48 11/30/2010 2034   CHOLHDL 4.8 Ratio 11/30/2010 2034   VLDL 39 11/30/2010 2034   LDLCALC 142* 11/30/2010 2034       Assessment and plan:   Patient Active Problem List   Diagnosis Date Noted  . Radicular pain in right arm 10/16/2013  . Seizure 07/19/2013  . Right arm pain 07/19/2013  . HTN (hypertension) 05/06/2013  . Other and unspecified hyperlipidemia 05/06/2013       Seizure disorder Tegretol refill Encouraged compliance Upcoming neurology appointment  Hypertension Refills lisinopril  Dyslipidemia refilled pravastatin  Shoulder pain Rescheduled MRI Rescheduled for sports medicine appointment  Followup in 6 weeks      The patient was given clear instructions to go to ER or return to medical center if symptoms don't improve, worsen or new problems develop. The patient verbalized understanding. The patient was told to call to get any lab results if not heard anything in the next week.

## 2013-11-04 NOTE — Progress Notes (Signed)
Pt is here for a f/u visit. Needs refill on Pravastatin and Lisinopril. Recently experienced acid in Rt eye and was referred to an specialist. No blurred vision or pain today.

## 2013-11-19 ENCOUNTER — Ambulatory Visit (HOSPITAL_COMMUNITY)
Admission: RE | Admit: 2013-11-19 | Discharge: 2013-11-19 | Disposition: A | Discharge status: Home / Self Care | Payer: No Typology Code available for payment source | Source: Ambulatory Visit | Attending: Internal Medicine | Admitting: Internal Medicine

## 2013-11-19 DIAGNOSIS — M25519 Pain in unspecified shoulder: Secondary | ICD-10-CM | POA: Insufficient documentation

## 2013-11-19 DIAGNOSIS — M25511 Pain in right shoulder: Secondary | ICD-10-CM

## 2013-11-20 ENCOUNTER — Telehealth: Payer: Self-pay | Admitting: *Deleted

## 2013-11-20 NOTE — Telephone Encounter (Signed)
Message copied by Tyri Elmore, [place] R on Wed Nov 20, 2013 10:24 AM ------      Message from: Allyson Sabal MD, [facility]      Created: Tue Nov 19, 2013 11:28 AM       Essentially normal MRI of the cervical spine ------

## 2013-11-27 ENCOUNTER — Ambulatory Visit: Payer: No Typology Code available for payment source | Admitting: Emergency Medicine

## 2013-12-04 ENCOUNTER — Encounter: Payer: Self-pay | Admitting: Emergency Medicine

## 2013-12-04 ENCOUNTER — Ambulatory Visit (INDEPENDENT_AMBULATORY_CARE_PROVIDER_SITE_OTHER): Payer: No Typology Code available for payment source | Admitting: Emergency Medicine

## 2013-12-04 VITALS — BP 126/82 | HR 67 | Ht 65.0 in | Wt 164.0 lb

## 2013-12-04 DIAGNOSIS — M62838 Other muscle spasm: Secondary | ICD-10-CM

## 2013-12-04 DIAGNOSIS — IMO0002 Reserved for concepts with insufficient information to code with codable children: Secondary | ICD-10-CM

## 2013-12-04 DIAGNOSIS — M792 Neuralgia and neuritis, unspecified: Secondary | ICD-10-CM

## 2013-12-04 MED ORDER — CYCLOBENZAPRINE HCL 10 MG PO TABS: 10.0000 mg | ORAL_TABLET | Freq: Every evening | ORAL | Status: DC | PRN

## 2013-12-05 ENCOUNTER — Encounter: Payer: Self-pay | Admitting: Emergency Medicine

## 2013-12-05 NOTE — Assessment & Plan Note (Signed)
Given normal cervical spine MRI and shoulder MRI with only minimal glenohumeral arthritis, the source of his radicular symptoms appears to be right trapezius muscle spasm strain. Given a prescription for Flexeril to use as needed at night. In addition given in the perception for physical therapy. He'll followup as needed

## 2013-12-05 NOTE — Progress Notes (Signed)
Patient ID: Shawn Porter, male   DOB: May 16, 1971, 43 y.o.   MRN: [id number] 43 year old male presents for followup of right shoulder and neck pain. An MRI of cervical spine as well as right shoulder. Results of these revealed no significant abnormalities at this time. He continues to have some discomfort radiating from the right paraspinous musculature down into his right shoulder with a little bit of numbness and tingling sensation into his hand he reports that sensation on the dorsum of his hand. No change in symptoms currently. No worsening of symptoms. Symptoms are exacerbated by movement of the right arm overhead.  Pertinent past medical history hypertension, seizure disorder.   Review of systems as per history of present illness otherwise negative  Examination: BP 126/82  Pulse 67  Ht 5\' 5"  (1.651 m)  Wt 164 lb (74.39 kg)  BMI 27.29 kg/m2 Well-developed well-nourished 43 year old male awake alert oriented no acute distress  Cervical spine examination reveals no vertebral point tenderness. There is some paraspinous tenderness in the region of the right trapezius muscle radiating across the shoulder blade.  Shoulder: Inspection reveals no abnormalities, atrophy or asymmetry. Palpation is normal with no tendernesMRI results were reviewed with patient in the office today.s over [facility] joint or bicipital groove. ROM is full in all planes. Rotator cuff strength normal throughout. No signs of impingement with negative Neer and Hawkin's tests, empty can. Speeds and Yergason's tests normal. No labral pathology noted with negative Obrien's Normal scapular function observed. No painful arc and no drop arm sign. No apprehension sign

## 2013-12-10 ENCOUNTER — Ambulatory Visit: Payer: No Typology Code available for payment source | Attending: Emergency Medicine | Admitting: Physical Therapy

## 2013-12-18 ENCOUNTER — Ambulatory Visit: Payer: No Typology Code available for payment source

## 2013-12-19 ENCOUNTER — Ambulatory Visit: Payer: No Typology Code available for payment source | Admitting: Internal Medicine

## 2014-01-23 ENCOUNTER — Ambulatory Visit: Payer: Self-pay | Attending: Internal Medicine

## 2014-08-26 ENCOUNTER — Ambulatory Visit: Payer: Self-pay | Attending: Internal Medicine | Admitting: Internal Medicine

## 2014-08-26 ENCOUNTER — Encounter: Payer: Self-pay | Admitting: Internal Medicine

## 2014-08-26 VITALS — BP 152/98 | HR 98 | Temp 98.3°F | Ht 65.0 in | Wt 170.8 lb

## 2014-08-26 DIAGNOSIS — M25511 Pain in right shoulder: Secondary | ICD-10-CM

## 2014-08-26 DIAGNOSIS — R569 Unspecified convulsions: Secondary | ICD-10-CM

## 2014-08-26 DIAGNOSIS — G8929 Other chronic pain: Secondary | ICD-10-CM | POA: Insufficient documentation

## 2014-08-26 DIAGNOSIS — M25519 Pain in unspecified shoulder: Secondary | ICD-10-CM | POA: Insufficient documentation

## 2014-08-26 DIAGNOSIS — I10 Essential (primary) hypertension: Secondary | ICD-10-CM

## 2014-08-26 DIAGNOSIS — K029 Dental caries, unspecified: Secondary | ICD-10-CM

## 2014-08-26 DIAGNOSIS — E785 Hyperlipidemia, unspecified: Secondary | ICD-10-CM

## 2014-08-26 DIAGNOSIS — G40909 Epilepsy, unspecified, not intractable, without status epilepticus: Secondary | ICD-10-CM | POA: Insufficient documentation

## 2014-08-26 LAB — COMPLETE METABOLIC PANEL WITH GFR
ALT: 27 U/L (ref 0–53)
AST: 20 U/L (ref 0–37)
Albumin: 4.7 g/dL (ref 3.5–5.2)
Alkaline Phosphatase: 67 U/L (ref 39–117)
BILIRUBIN TOTAL: 1.2 mg/dL (ref 0.2–1.2)
BUN: 13 mg/dL (ref 6–23)
CO2: 27 meq/L (ref 19–32)
Calcium: 9.6 mg/dL (ref 8.4–10.5)
Chloride: 101 mEq/L (ref 96–112)
Creat: 0.81 mg/dL (ref 0.50–1.35)
GFR, Est Non African American: >89 mL/min
Glucose, Bld: 114 mg/dL — ABNORMAL HIGH (ref 70–99)
Potassium: 4.3 mEq/L (ref 3.5–5.3)
Sodium: 137 mEq/L (ref 135–145)
Total Protein: 7.5 g/dL (ref 6.0–8.3)

## 2014-08-26 LAB — CBC WITH DIFFERENTIAL/PLATELET
BASOS ABS: 0.1 10*3/uL (ref 0.0–0.1)
Basophils Relative: 1 % (ref 0–1)
EOS ABS: 0.2 10*3/uL (ref 0.0–0.7)
Eosinophils Relative: 3 % (ref 0–5)
HCT: 49.9 % (ref 39.0–52.0)
Hemoglobin: 18.2 g/dL — ABNORMAL HIGH (ref 13.0–17.0)
LYMPHS ABS: 2.7 10*3/uL (ref 0.7–4.0)
Lymphocytes Relative: 41 % (ref 12–46)
MCH: 33.2 pg (ref 26.0–34.0)
MCHC: 36.5 g/dL — ABNORMAL HIGH (ref 30.0–36.0)
MCV: 90.9 fL (ref 78.0–100.0)
Monocytes Absolute: 0.6 10*3/uL (ref 0.1–1.0)
Monocytes Relative: 9 % (ref 3–12)
NEUTROS PCT: 46 % (ref 43–77)
Neutro Abs: 3 10*3/uL (ref 1.7–7.7)
PLATELETS: 282 10*3/uL (ref 150–400)
RBC: 5.49 MIL/uL (ref 4.22–5.81)
RDW: 13.2 % (ref 11.5–15.5)
WBC: 6.6 10*3/uL (ref 4.0–10.5)

## 2014-08-26 LAB — LIPID PANEL
CHOL/HDL RATIO: 5.9 ratio
Cholesterol: 212 mg/dL — ABNORMAL HIGH (ref 0–200)
HDL: 36 mg/dL — AB (ref >39)
LDL CALC: 101 mg/dL — AB (ref 0–99)
TRIGLYCERIDES: 375 mg/dL — AB (ref <150)
VLDL: 75 mg/dL — AB (ref 0–40)

## 2014-08-26 LAB — TSH: TSH: 2.529 u[IU]/mL (ref 0.350–4.500)

## 2014-08-26 MED ORDER — PRAVASTATIN SODIUM 20 MG PO TABS: 20.0000 mg | ORAL_TABLET | Freq: Every day | ORAL | Status: DC

## 2014-08-26 MED ORDER — MELOXICAM 15 MG PO TABS: 15.0000 mg | ORAL_TABLET | Freq: Every day | ORAL | Status: DC

## 2014-08-26 MED ORDER — LISINOPRIL 10 MG PO TABS: 10.0000 mg | ORAL_TABLET | Freq: Every day | ORAL | Status: DC

## 2014-08-26 NOTE — Progress Notes (Signed)
Patient ID: Shawn Porter, male   DOB: 05/12/1971, 42 y.o.   MRN: [id number]   Jevan Gaunt, is a 43 y.o. male  LKG:[id number]  GUY:[id number]  DOB - 1970-12-03  Chief Complaint  Patient presents with  . Hypertension  . Annual Exam        Subjective:   Shawn Porter is a 43 y.o. male here today for a follow up visit. Patient with history of seizure disorder, hypertension, hyperlipidemia, chronic shoulder pain, here today for routine follow-up. He has no new complaints. He denies medication refills. He is compliant with medications. Reports no side effects. He also request referral to dentist's for dental caries. Patient has No headache, No chest pain, No abdominal pain - No Nausea, No new weakness tingling or numbness, No Cough - SOB.  Problem  Essential Hypertension  Shoulder Pain, Right  Dental Caries    ALLERGIES: No Known Allergies  PAST MEDICAL HISTORY: Past Medical History  Diagnosis Date  . Hypertension   . Hyperlipidemia     MEDICATIONS AT HOME: Prior to Admission medications   Medication Sig Start Date End Date Taking? Authorizing Provider  carbamazepine (TEGRETOL) 200 MG tablet Take 1 tablet (200 mg total) by mouth daily. 11/04/13   Reyne Dumas, MD  cyclobenzaprine (FLEXERIL) 10 MG tablet Take 1 tablet (10 mg total) by mouth at bedtime as needed for muscle spasms. 12/04/13   Hennie Duos, MD  ibuprofen (ADVIL,MOTRIN) 200 MG tablet Take 200-400 mg by mouth daily as needed (pain).    Historical Provider, MD  lisinopril (PRINIVIL,ZESTRIL) 10 MG tablet Take 1 tablet (10 mg total) by mouth at bedtime. 08/26/14   Tresa Garter, MD  meloxicam (MOBIC) 15 MG tablet Take 1 tablet (15 mg total) by mouth daily. 08/26/14   Tresa Garter, MD  pravastatin (PRAVACHOL) 20 MG tablet Take 1 tablet (20 mg total) by mouth at bedtime. 08/26/14   Tresa Garter, MD     Objective:   Filed Vitals:   08/26/14 1033  BP: 152/98  Pulse: 98  Temp: 98.3 F  (36.8 C)  TempSrc: Oral  Height: 5\' 5"  (1.651 m)  Weight: 170 lb 12.8 oz (77.474 kg)    Exam General appearance : Awake, alert, not in any distress. Speech Clear. Not toxic looking HEENT: Atraumatic and Normocephalic, pupils equally reactive to light and accomodation Neck: supple, no JVD. No cervical lymphadenopathy.  Chest:Good air entry bilaterally, no added sounds  CVS: S1 S2 regular, no murmurs.  Abdomen: Bowel sounds present, Non tender and not distended with no gaurding, rigidity or rebound. Extremities: B/L Lower Ext shows no edema, both legs are warm to touch Neurology: Awake alert, and oriented X 3, CN II-XII intact, Non focal  Data Review Lab Results  Component Value Date   HGBA1C 5.2 01/06/2010     Assessment & Plan   1. Essential hypertension  - CBC with Differential - COMPLETE METABOLIC PANEL WITH GFR - POCT glycosylated hemoglobin (Hb A1C) - Lipid panel - TSH - Urinalysis, Complete  - lisinopril (PRINIVIL,ZESTRIL) 10 MG tablet; Take 1 tablet (10 mg total) by mouth at bedtime.  Dispense: 90 tablet; Refill: 3  - We have discussed target BP range and blood pressure goal - I have advised patient to check BP regularly and to call us back or report to clinic if the numbers are consistently higher than 140/90  - We discussed the importance of compliance with medical therapy and DASH diet recommended, consequences of uncontrolled  hypertension discussed.  - continue current BP medications  2. Shoulder pain, right  - meloxicam (MOBIC) 15 MG tablet; Take 1 tablet (15 mg total) by mouth daily.  Dispense: 30 tablet; Refill: 3  3. Seizures  Continue current medication, carbamazepine  4. Dental caries  - Ambulatory referral to Dentistry  5. Dyslipidemia  - pravastatin (PRAVACHOL) 20 MG tablet; Take 1 tablet (20 mg total) by mouth at bedtime.  Dispense: 90 tablet; Refill: 3  To address this please limit saturated fat to no more than 7% of your calories, limit  cholesterol to 200 mg/day, increase fiber and exercise as tolerated. If needed we may add another cholesterol lowering medication to your regimen.   Interpreter was used to communicate directly with patient for the entire encounter including providing detailed patient instructions.  Return in about 6 months (around 02/25/2015), or if symptoms worsen or fail to improve, for Follow up HTN.  The patient was given clear instructions to go to ER or return to medical center if symptoms don't improve, worsen or new problems develop. The patient verbalized understanding. The patient was told to call to get lab results if they haven't heard anything in the next week.   This note has been created with Surveyor, quantity. Any transcriptional errors are unintentional.    Angelica Chessman, MD, [place], [place], [place] and [place], [place]   08/26/2014, 11:19 AM

## 2014-08-26 NOTE — Patient Instructions (Signed)
Plan de alimentacin DASH (DASH Eating Plan) DASH es la sigla en ingls de "Enfoques Alimentarios para Detener la Hipertensin". El plan de alimentacin DASH ha demostrado bajar la presin arterial elevada (hipertensin). Los beneficios adicionales para la salud pueden incluir la disminucin del riesgo de diabetes mellitus tipo2, enfermedades cardacas e ictus. Este plan tambin puede ayudar a Horticulturist, commercial. QU DEBO SABER ACERCA DEL PLAN DE ALIMENTACIN DASH? Para el plan de alimentacin DASH, seguir las siguientes pautas generales:  Elija los alimentos con un valor porcentual diario de sodio de menos del 5% (segn figura en la etiqueta del alimento).  Use hierbas o aderezos sin sal, en lugar de sal de mesa o sal marina.  Consulte al mdico o farmacutico antes de usar sustitutos de la sal.  Coma productos con bajo contenido de sodio, cuya etiqueta suele decir "bajo contenido de sodio" o "sin agregado de sal".  Coma alimentos frescos.  Coma ms verduras, frutas y productos lcteos con bajo contenido de [place].  Elija los cereales integrales. Busque la palabra "integral" en Equities trader de la lista de ingredientes.  Elija el pescado y el pollo o el pavo sin piel ms a menudo que las carnes rojas. Limite el consumo de pescado, carne de ave y carne a 6onzas (170g) por Training and development officer.  Limite el consumo de dulces, postres, azcares y bebidas azucaradas.  Elija las grasas saludables para el corazn.  Limite el consumo de queso a 1onza (28g) por Training and development officer.  Consuma ms comida casera y menos de restaurante, de buf y comida rpida.  Limite el consumo de alimentos fritos.  Cocine los alimentos utilizando mtodos que no sean la fritura.  Limite las verduras enlatadas. Si las consume, enjuguelas bien para disminuir el sodio.  Cuando coma en un restaurante, pida que preparen su comida con menos sal o, en lo posible, sin nada de sal. QU ALIMENTOS PUEDO COMER? Pida ayuda a un nutricionista para  conocer las necesidades calricas individuales. Cereales Pan de salvado o integral. Arroz integral. Pastas de salvado o integrales. Quinua, trigo burgol y cereales integrales. Cereales con bajo contenido de sodio. Tortillas de harina de maz o de salvado. Pan de maz integral. Galletas saladas integrales. Galletas con bajo contenido de [place]. Vegetales Verduras frescas o congeladas (crudas, al vapor, asadas o grilladas). Jugos de tomate y verduras con contenido bajo o reducido de sodio. Pasta y salsa de tomate con contenido bajo o [place]. Verduras enlatadas con bajo contenido de sodio o reducido de sodio.  Lambert Mody Lambert Mody frescas, en conserva (en su jugo natural) o frutas congeladas. Carnes y otros productos con protenas Carne de res molida (al 85% o ms [place]), carne de res de animales alimentados con pastos o carne de res sin la grasa. Pollo o pavo sin piel. Carne de pollo o de [place]. Cerdo sin la grasa. Todos los pescados y frutos de mar. Huevos. Porotos, guisantes o lentejas secos. Frutos secos y semillas sin sal. Frijoles enlatados sin sal. Lcteos Productos lcteos con bajo contenido de grasas, como [place] o al 1%, quesos reducidos en grasas o al 2%, ricota con bajo contenido de grasas o [employer], o yogur natural con bajo contenido de [place]. Quesos con contenido bajo o reducido de sodio. Grasas y Naval architect en barra que no contengan grasas trans. [place]ones[place] y alios para ensaladas livianos o reducidos en grasas (reducidos en sodio). [place]. Aceites de crtamo, oliva o canola. [place] natural de man o almendra. Otros Palomitas de maz y pretzels sin sal.  Los artculos mencionados arriba pueden no ser [employer] de las bebidas o los alimentos recomendados. Comunquese con el nutricionista para conocer ms opciones. QU ALIMENTOS NO SE RECOMIENDAN? Cereales Pan blanco. Pastas blancas. Arroz blanco. Pan de maz refinado. Bagels y  croissants. Galletas saladas que contengan grasas trans. Vegetales Vegetales con crema o fritos. Verduras en [place]. Verduras enlatadas comunes. Pasta y salsa de tomate en lata comunes. Jugos comunes de tomate y de verduras. Lambert Mody Frutas secas. Fruta enlatada en almbar liviano o espeso. Jugo de frutas. Carnes y otros productos con protenas [place] de carne con Lobbyist. Costillas, alas de pollo, tocineta, salchicha, mortadela, salame, chinchulines, tocino, perros calientes, salchichas alemanas y embutidos envasados. Frutos secos y semillas con sal. Frijoles con sal en lata. Lcteos Leche entera o al 2%, crema, mezcla de [place] y crema, y queso crema. Yogur entero o endulzado. Quesos o queso azul con alto contenido de Physicist, medical. [place]rem[place] no lcteas y coberturas batidas. Quesos procesados, quesos para untar o cuajadas. Condimentos Sal de cebolla y ajo, sal condimentada, sal de mesa y sal marina. Salsas en lata y envasadas. Salsa [place]. Salsa trtara. Salsa barbacoa. Salsa teriyaki. Salsa de soja, incluso la que tiene contenido reducido de [place]. Salsa de carne. Salsa de pescado. Salsa de [place]. Salsa rosada. Rbano picante. Ketchup y mostaza. Saborizantes y tiernizantes para carne. Caldo en cubitos. Salsa picante. Salsa tabasco. Adobos. Aderezos para tacos. Salsas. Grasas y aceites [place], [place] en barra, [place] de [place], [place], [place] clarificada y Wendee Copp de tocino. Aceites de coco, de palmiste o de palma. Aderezos comunes para ensalada. Otros Pickles y [place]. Palomitas de maz y pretzels con sal. Los artculos mencionados arriba pueden no ser [employer] de las bebidas y los alimentos que se Higher education careers adviser. Comunquese con el nutricionista para obtener ms informacin. DNDE Dolan Amen MS INFORMACIN? [place], del Pulmn y de la Sangre (National Heart, Lung, and [place]):  [email or web address] Document Released: 10/06/2011 Document Revised: 03/03/2014 [facility] Patient Information 2015 [place], [place]. This information is not intended to replace advice given to you by your health care provider. Make sure you discuss any questions you have with your health care provider. Hipertensin (Hypertension) La hipertensin, conocida comnmente como presin arterial alta, se produce cuando la sangre bombea en las arterias con mucha fuerza. Las arterias son los vasos sanguneos que transportan la sangre desde el corazn hacia todas las partes del cuerpo. Una lectura de la presin arterial consiste en un nmero ms alto sobre un nmero ms bajo, por ejemplo, 110/72. El nmero ms alto (presin sistlica) corresponde a la presin interna de las arterias cuando el corazn [place]. El nmero ms bajo (presin diastlica) corresponde a la presin interna de las arterias cuando el corazn se relaja. En condiciones ideales, la presin arterial debe ser inferior a 120/80. La hipertensin fuerza al corazn a trabajar ms para Herbalist. Las arterias pueden estrecharse o ponerse rgidas. La hipertensin conlleva el riesgo de enfermedad cardaca, ictus y otros problemas.  [place] de riesgo de hipertensin son controlables, pero otros no lo son.  [employer] factores de riesgo que usted no puede Chief Technology Officer, se incluyen:   Manufacturing systems engineer. El riesgo es mayor para las Retail banker.  La edad. Los riesgos aumentan con la edad.  El sexo. Antes de los 45aos, los hombres corren ms [employer]. Despus de los 65aos, las mujeres corren ms [employer]. [employer] factores de [place]  que usted IT consultant, se incluyen:  No hacer la cantidad suficiente de actividad fsica o ejercicio.  Tener sobrepeso.  Consumir mucha grasa, azcar, caloras o sal en la dieta.  Beber alcohol en exceso. SIGNOS Y  SNTOMAS Por lo general, la hipertensin no causa signos o sntomas. La hipertensin demasiado alta (crisis hipertensiva) puede causar dolor de cabeza, ansiedad, falta de aire y hemorragia nasal. DIAGNSTICO  Para detectar si usted tiene hipertensin, el mdico le medir la presin arterial mientras est sentado, con el brazo levantado a la altura del corazn. Debe medirla al [facility] veces en el mismo brazo. Determinadas condiciones pueden causar una diferencia de presin arterial entre el brazo izquierdo y Insurance underwriter. El hecho de tener una sola lectura de la presin arterial ms alta que lo normal no significa que Stage manager. En el caso de tener una lectura de la presin arterial con un valor alto, pdale al mdico que la verifique nuevamente. [place] hipertensin arterial incluye hacer cambios en el estilo de vida y, posiblemente, tomar medicamentos. Un estilo de vida saludable puede ayudar a bajar la presin arterial alta. Quiz deba cambiar algunos hbitos. Los Levi Strauss en el estilo de vida pueden incluir:  Seguir la dieta DASH. Esta dieta tiene un alto contenido de frutas, verduras y Psychologist, prison and probation services. Incluye poca cantidad de sal, carnes rojas y azcares agregados.  Hacer al menos 2horas de actividad fsica enrgica todas las semanas.  Perder peso, si es necesario.  No fumar.  Limitar el consumo de bebidas alcohlicas.  Aprender formas de reducir el estrs. Si los cambios en el estilo de vida no son suficientes para Child psychotherapist la presin arterial, el mdico puede recetarle medicamentos. Quiz necesite tomar ms de uno. Trabaje en conjunto con su mdico para comprender los riesgos y los beneficios. INSTRUCCIONES PARA EL CUIDADO EN EL HOGAR  Haga que le midan de nuevo la presin arterial segn las indicaciones del [place] los medicamentos solamente como se lo haya indicado el mdico. Siga cuidadosamente las indicaciones. Los  medicamentos para la presin arterial deben tomarse segn las indicaciones. Los medicamentos pierden eficacia al omitir las dosis. El hecho de omitir las dosis tambin [place] el riesgo de otros problemas.  No fume.  Contrlese la presin arterial en su casa segn las indicaciones del mdico. SOLICITE ATENCIN MDICA SI:   Piensa que tiene una reaccin alrgica a los medicamentos.  Tiene mareos o dolores de cabeza con Scientist, research (physical sciences).  Tiene hinchazn en los tobillos.  Tiene problemas de visin. SOLICITE ATENCIN MDICA DE INMEDIATO SI:  Siente un dolor de cabeza intenso o confusin.  Siente debilidad inusual, adormecimiento o que Geneticist, molecular.  Siente dolor intenso en el pecho o en el abdomen.  Vomita repetidas veces.  Tiene dificultad para respirar. ASEGRESE DE QUE:   Comprende estas instrucciones.  Controlar su afeccin.  Recibir ayuda de inmediato si no mejora o si empeora. Document Released: 10/17/2005 Document Revised: 03/03/2014 [facility] Patient Information 2015 [place], [place]. This information is not intended to replace advice given to you by your health care provider. Make sure you discuss any questions you have with your health care provider.

## 2014-08-26 NOTE — Progress Notes (Signed)
Patient states that he is here for an annual physical and HTN follow up. He is currently not taking any medications due to the side effect fo dizziness. He is having muscle cramp/pain in left lower are 7/10 for 4 days now.

## 2014-08-27 LAB — URINALYSIS, COMPLETE
BACTERIA UA: NONE SEEN
Bilirubin Urine: NEGATIVE
CASTS: NONE SEEN
CRYSTALS: NONE SEEN
Glucose, UA: NEGATIVE mg/dL
HGB URINE DIPSTICK: NEGATIVE
KETONES UR: NEGATIVE mg/dL
Leukocytes, UA: NEGATIVE
NITRITE: NEGATIVE
Protein, ur: NEGATIVE mg/dL
Specific Gravity, Urine: 1.019 (ref 1.005–1.030)
Squamous Epithelial / LPF: NONE SEEN
UROBILINOGEN UA: 0.2 mg/dL (ref 0.0–1.0)
pH: 6.5 (ref 5.0–8.0)

## 2014-10-27 ENCOUNTER — Ambulatory Visit: Payer: Self-pay | Attending: Internal Medicine | Admitting: Internal Medicine

## 2014-10-27 ENCOUNTER — Encounter: Payer: Self-pay | Admitting: Internal Medicine

## 2014-10-27 VITALS — BP 142/92 | HR 75 | Temp 98.6°F | Resp 18 | Ht 65.0 in | Wt 171.0 lb

## 2014-10-27 DIAGNOSIS — I10 Essential (primary) hypertension: Secondary | ICD-10-CM | POA: Insufficient documentation

## 2014-10-27 DIAGNOSIS — Z79899 Other long term (current) drug therapy: Secondary | ICD-10-CM | POA: Insufficient documentation

## 2014-10-27 DIAGNOSIS — K4090 Unilateral inguinal hernia, without obstruction or gangrene, not specified as recurrent: Secondary | ICD-10-CM | POA: Insufficient documentation

## 2014-10-27 DIAGNOSIS — M25519 Pain in unspecified shoulder: Secondary | ICD-10-CM | POA: Insufficient documentation

## 2014-10-27 DIAGNOSIS — M25511 Pain in right shoulder: Secondary | ICD-10-CM | POA: Insufficient documentation

## 2014-10-27 DIAGNOSIS — E781 Pure hyperglyceridemia: Secondary | ICD-10-CM | POA: Insufficient documentation

## 2014-10-27 MED ORDER — GEMFIBROZIL 600 MG PO TABS: 600.0000 mg | ORAL_TABLET | Freq: Two times a day (BID) | ORAL | Status: DC

## 2014-10-27 MED ORDER — ACETAMINOPHEN-CODEINE #3 300-30 MG PO TABS: 1.0000 | ORAL_TABLET | ORAL | Status: DC | PRN

## 2014-10-27 MED ORDER — LISINOPRIL 10 MG PO TABS: 10.0000 mg | ORAL_TABLET | Freq: Every day | ORAL | Status: DC

## 2014-10-27 NOTE — Progress Notes (Signed)
Patient ID: Shawn Porter, male   DOB: [birth year], 43 y.o.   MRN: [id number]   Shawn Porter, is a 43 y.o. male  [id number]  [id number]  DOB - November 20, 1970  Chief Complaint  Patient presents with  . Follow-up  . Arm Pain        Subjective:   Shawn Porter is a 43 y.o. male here today for a follow up visit. Patient has dyslipidemia on pravastatin, has generalized body pain especially in the limbs since starting the medication, he is here today with a major complaint of bulge and pain in the right groin area. The bulge increases in size when he coughs or lift objects. 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 He does not smoke cigarette, he drinks alcohol occasionally. He has been seen as part medicine several times for shoulder pain, MRI of the cervical spine has been unrevealing. Patient has No headache, No chest pain, No abdominal pain - No Nausea, No new weakness tingling or numbness, No Cough - SOB.  Problem  Right Inguinal Hernia  Hypertriglyceridemia  Pain in Joint, Shoulder Region    ALLERGIES: No Known Allergies  PAST MEDICAL HISTORY: Past Medical History  Diagnosis Date  . Hypertension   . Hyperlipidemia     MEDICATIONS AT HOME: Prior to Admission medications   Medication Sig Start Date End Date Taking? Authorizing Provider  cyclobenzaprine (FLEXERIL) 10 MG tablet Take 1 tablet (10 mg total) by mouth at bedtime as needed for muscle spasms. 12/04/13  Yes Hennie Duos, MD  lisinopril (PRINIVIL,ZESTRIL) 10 MG tablet Take 1 tablet (10 mg total) by mouth at bedtime. 10/27/14  Yes Tresa Garter, MD  acetaminophen-codeine (TYLENOL #3) 300-30 MG per tablet Take 1 tablet by mouth every 4 (four) hours as needed. 10/27/14   Tresa Garter, MD  carbamazepine (TEGRETOL) 200 MG tablet Take 1 tablet (200 mg total) by mouth daily. Patient not taking: Reported on 10/27/2014 11/04/13   Reyne Dumas, MD  gemfibrozil (LOPID) 600  MG tablet Take 1 tablet (600 mg total) by mouth 2 (two) times daily before a meal. 10/27/14   Tresa Garter, MD  ibuprofen (ADVIL,MOTRIN) 200 MG tablet Take 200-400 mg by mouth daily as needed (pain).    Historical Provider, MD     Objective:   Filed Vitals:   10/27/14 1732  BP: 142/92  Pulse: 75  Temp: 98.6 F (37 C)  TempSrc: Oral  Resp: 18  Height: 5\' 5"  (1.651 m)  Weight: 171 lb (77.565 kg)  SpO2: 98%    Exam General appearance: Awake, alert, not in any distress. Speech Clear. Not toxic looking HEENT: Atraumatic and Normocephalic, pupils equally reactive to light and accomodation Neck: supple, no JVD. No cervical lymphadenopathy.  Chest: Good air entry bilaterally, no added sounds  CVS: S1 S2 regular, no murmurs.  Abdomen:  right inguinal hernia most likely direct, no extension to the scrotal sac with positive cough reflex. Bowel sounds present, Non tender and not distended with no gaurding, rigidity or rebound. Extremities: B/L Lower Ext shows no edema, both legs are warm to touch Neurology: Awake alert, and oriented X 3, CN II-XII intact, Non focal   Data Review Lab Results  Component Value Date   HGBA1C 5.2 01/06/2010     Assessment & Plan   1. Essential hypertension  - lisinopril (PRINIVIL,ZESTRIL) 10 MG tablet; Take 1 tablet (10 mg total) by mouth at bedtime.  Dispense: 90 tablet;  Refill: 3  2. Right inguinal hernia  - Ambulatory referral to General Surgery  3. Hypertriglyceridemia  - gemfibrozil (LOPID) 600 MG tablet; Take 1 tablet (600 mg total) by mouth 2 (two) times daily before a meal.  Dispense: 180 tablet; Refill: 3  4. Pain in joint, shoulder region, right  - acetaminophen-codeine (TYLENOL #3) 300-30 MG per tablet; Take 1 tablet by mouth every 4 (four) hours as needed.  Dispense: 60 tablet; Refill: 0   Patient was counseled extensively about nutrition and exercise Patient was counseled and educated on lifting of materials, he was  instructed to report immediately to the ED if persistent pain, nausea or vomiting or abdominal distention develops.  Interpreter was used to communicate directly with patient for the entire encounter including providing detailed patient instructions.   Return in about 3 months (around 01/26/2015) for Follow up HTN, Follow up Pain and comorbidities.  The patient was given clear instructions to go to ER or return to medical center if symptoms don't improve, worsen or new problems develop. The patient verbalized understanding. The patient was told to call to get lab results if they haven't heard anything in the next week.   This note has been created with Surveyor, quantity. Any transcriptional errors are unintentional.    Angelica Chessman, MD, [place], [place], [place] and [place], [place]   10/27/2014, 5:59 PM

## 2014-10-27 NOTE — Progress Notes (Signed)
Pt comes in with continuous right arm pain radiating to right shoulder intermit x 1 month  Pt states medication prescribed is not effective for pain Requesting evaluation of lump in right groin area- denies swelling or pain to area Spanish interpretor present

## 2015-01-23 ENCOUNTER — Ambulatory Visit: Payer: Self-pay | Attending: Internal Medicine

## 2015-02-24 ENCOUNTER — Ambulatory Visit: Payer: Self-pay | Attending: Internal Medicine | Admitting: Internal Medicine

## 2015-02-24 VITALS — BP 122/71 | HR 71 | Temp 98.3°F | Ht 66.0 in | Wt 160.0 lb

## 2015-02-24 DIAGNOSIS — M25511 Pain in right shoulder: Secondary | ICD-10-CM

## 2015-02-24 DIAGNOSIS — I10 Essential (primary) hypertension: Secondary | ICD-10-CM

## 2015-02-24 DIAGNOSIS — E781 Pure hyperglyceridemia: Secondary | ICD-10-CM

## 2015-02-24 LAB — LIPID PANEL
Cholesterol: 198 mg/dL (ref 0–200)
HDL: 37 mg/dL — AB (ref >=40)
LDL CALC: 111 mg/dL — AB (ref 0–99)
Total CHOL/HDL Ratio: 5.4 Ratio
Triglycerides: 248 mg/dL — ABNORMAL HIGH (ref <150)
VLDL: 50 mg/dL — AB (ref 0–40)

## 2015-02-24 MED ORDER — ACETAMINOPHEN-CODEINE #3 300-30 MG PO TABS: 1.0000 | ORAL_TABLET | ORAL | Status: DC | PRN

## 2015-02-24 MED ORDER — GEMFIBROZIL 600 MG PO TABS: 600.0000 mg | ORAL_TABLET | Freq: Two times a day (BID) | ORAL | Status: DC

## 2015-02-24 MED ORDER — DICLOFENAC SODIUM 1 % TD GEL: 2.0000 g | Freq: Four times a day (QID) | TRANSDERMAL | Status: DC

## 2015-02-24 MED ORDER — LISINOPRIL 10 MG PO TABS: 10.0000 mg | ORAL_TABLET | Freq: Every day | ORAL | Status: DC

## 2015-02-24 NOTE — Progress Notes (Signed)
Patient ID: Shawn Porter, male   DOB: [birth year], 44 y.o.   MRN: [id number]   Shawn Porter, is a 44 y.o. male  [id number]  [id number]  DOB - 23-Oct-1971  Chief Complaint  Patient presents with  . Arm Pain        Subjective:   Shawn Porter is a 44 y.o. male here today for a follow up visit. Patient has medical history of hypertension, hyperlipidemia and chronic pain. Patient has had right arm pain 8/10 for past 4 months-concentrated over the elbow He has been taking ibuprofen for pain which has not been effective. He needs refills on his blood pressure medication. Patient has No headache, No chest pain, No abdominal pain - No Nausea, No new weakness tingling or numbness, No Cough - SOB.  No problems updated.  ALLERGIES: No Known Allergies  PAST MEDICAL HISTORY: Past Medical History  Diagnosis Date  . Hypertension   . Hyperlipidemia     MEDICATIONS AT HOME: Prior to Admission medications   Medication Sig Start Date End Date Taking? Authorizing Provider  gemfibrozil (LOPID) 600 MG tablet Take 1 tablet (600 mg total) by mouth 2 (two) times daily before a meal. 02/24/15  Yes Kenta Laster E Doreene Burke, MD  ibuprofen (ADVIL,MOTRIN) 200 MG tablet Take 200-400 mg by mouth daily as needed (pain).   Yes Historical Provider, MD  lisinopril (PRINIVIL,ZESTRIL) 10 MG tablet Take 1 tablet (10 mg total) by mouth at bedtime. 02/24/15  Yes Tresa Garter, MD  acetaminophen-codeine (TYLENOL #3) 300-30 MG per tablet Take 1 tablet by mouth every 4 (four) hours as needed. 02/24/15   Tresa Garter, MD  carbamazepine (TEGRETOL) 200 MG tablet Take 1 tablet (200 mg total) by mouth daily. Patient not taking: Reported on 10/27/2014 11/04/13   Reyne Dumas, MD  cyclobenzaprine (FLEXERIL) 10 MG tablet Take 1 tablet (10 mg total) by mouth at bedtime as needed for muscle spasms. Patient not taking: Reported on 02/24/2015 12/04/13   Hennie Duos, MD  diclofenac sodium (VOLTAREN) 1 % GEL  Apply 2 g topically 4 (four) times daily. 02/24/15   Tresa Garter, MD     Objective:   Filed Vitals:   02/24/15 1145  BP: 122/71  Pulse: 71  Temp: 98.3 F (36.8 C)  Height: 5\' 6"  (1.676 m)  Weight: 160 lb (72.576 kg)  SpO2: 98%    Exam General appearance : Awake, alert, not in any distress. Speech Clear. Not toxic looking HEENT: Atraumatic and Normocephalic, pupils equally reactive to light and accomodation Neck: supple, no JVD. No cervical lymphadenopathy.  Chest:Good air entry bilaterally, no added sounds  CVS: S1 S2 regular, no murmurs.  Abdomen: Bowel sounds present, Non tender and not distended with no gaurding, rigidity or rebound. Extremities: B/L Lower Ext shows no edema, both legs are warm to touch Neurology: Awake alert, and oriented X 3, CN II-XII intact, Non focal Skin:No Rash  Data Review Lab Results  Component Value Date   HGBA1C 5.2 01/06/2010     Assessment & Plan   1. Pain in joint, shoulder region, right  - acetaminophen-codeine (TYLENOL #3) 300-30 MG per tablet; Take 1 tablet by mouth every 4 (four) hours as needed.  Dispense: 60 tablet; Refill: 0 - diclofenac sodium (VOLTAREN) 1 % GEL; Apply 2 g topically 4 (four) times daily.  Dispense: 1 Tube; Refill: 1  - Ambulatory referral to Sports Medicine  2. Essential hypertension  - lisinopril (PRINIVIL,ZESTRIL) 10 MG tablet; Take 1 tablet (10  mg total) by mouth at bedtime.  Dispense: 90 tablet; Refill: 3  We have discussed target BP range and blood pressure goal. I have advised patient to check BP regularly and to call us back or report to clinic if the numbers are consistently higher than 140/90. We discussed the importance of compliance with medical therapy and DASH diet recommended, consequences of uncontrolled hypertension discussed.  - continue current BP medications  3. Hypertriglyceridemia  - gemfibrozil (LOPID) 600 MG tablet; Take 1 tablet (600 mg total) by mouth 2 (two) times daily  before a meal.  Dispense: 180 tablet; Refill: 3 - Lipid panel  To address this please limit saturated fat to no more than 7% of your calories, limit cholesterol to 200 mg/day, increase fiber and exercise as tolerated. If needed we may add another cholesterol lowering medication to your regimen.   Patient have been counseled extensively about nutrition and exercise  Interpreter was used to communicate directly with patient for the entire encounter including providing detailed patient instructions.   Return in about 6 months (around 08/26/2015), or if symptoms worsen or fail to improve, for Follow up Pain and comorbidities, hyperlipidemia, Routine Follow Up.  The patient was given clear instructions to go to ER or return to medical center if symptoms don't improve, worsen or new problems develop. The patient verbalized understanding. The patient was told to call to get lab results if they haven't heard anything in the next week.   This note has been created with Surveyor, quantity. Any transcriptional errors are unintentional.    Angelica Chessman, MD, [place], [place], [place], [place] and [place], [place]   02/24/2015, 12:27 PM

## 2015-02-24 NOTE — Patient Instructions (Signed)
Plan de alimentacin DASH (DASH Eating Plan) DASH es la sigla en ingls de "Enfoques Alimentarios para Detener la Hipertensin". El plan de alimentacin DASH ha demostrado bajar la presin arterial elevada (hipertensin). Los beneficios adicionales para la salud pueden incluir la disminucin del riesgo de diabetes mellitus tipo2, enfermedades cardacas e ictus. Este plan tambin puede ayudar a Horticulturist, commercial. QU DEBO SABER ACERCA DEL PLAN DE ALIMENTACIN DASH? Para el plan de alimentacin DASH, seguir las siguientes pautas generales:  Elija los alimentos con un valor porcentual diario de sodio de menos del 5% (segn figura en la etiqueta del alimento).  Use hierbas o aderezos sin sal, en lugar de sal de mesa o sal marina.  Consulte al mdico o farmacutico antes de usar sustitutos de la sal.  Coma productos con bajo contenido de sodio, cuya etiqueta suele decir "bajo contenido de sodio" o "sin agregado de sal".  Coma alimentos frescos.  Coma ms verduras, frutas y productos lcteos con bajo contenido de [place].  Elija los cereales integrales. Busque la palabra "integral" en Equities trader de la lista de ingredientes.  Elija el pescado y el pollo o el pavo sin piel ms a menudo que las carnes rojas. Limite el consumo de pescado, carne de ave y carne a 6onzas (170g) por Training and development officer.  Limite el consumo de dulces, postres, azcares y bebidas azucaradas.  Elija las grasas saludables para el corazn.  Limite el consumo de queso a 1onza (28g) por Training and development officer.  Consuma ms comida casera y menos de restaurante, de buf y comida rpida.  Limite el consumo de alimentos fritos.  Cocine los alimentos utilizando mtodos que no sean la fritura.  Limite las verduras enlatadas. Si las consume, enjuguelas bien para disminuir el sodio.  Cuando coma en un restaurante, pida que preparen su comida con menos sal o, en lo posible, sin nada de sal. QU ALIMENTOS PUEDO COMER? Pida ayuda a un nutricionista para  conocer las necesidades calricas individuales. Cereales Pan de salvado o integral. Arroz integral. Pastas de salvado o integrales. Quinua, trigo burgol y cereales integrales. Cereales con bajo contenido de sodio. Tortillas de harina de maz o de salvado. Pan de maz integral. Galletas saladas integrales. Galletas con bajo contenido de [place]. Vegetales Verduras frescas o congeladas (crudas, al vapor, asadas o grilladas). Jugos de tomate y verduras con contenido bajo o reducido de sodio. Pasta y salsa de tomate con contenido bajo o [place]. Verduras enlatadas con bajo contenido de sodio o reducido de sodio.  Lambert Mody Lambert Mody frescas, en conserva (en su jugo natural) o frutas congeladas. Carnes y otros productos con protenas Carne de res molida (al 85% o ms [place]), carne de res de animales alimentados con pastos o carne de res sin la grasa. Pollo o pavo sin piel. Carne de pollo o de [place]. Cerdo sin la grasa. Todos los pescados y frutos de mar. Huevos. Porotos, guisantes o lentejas secos. Frutos secos y semillas sin sal. Frijoles enlatados sin sal. Lcteos Productos lcteos con bajo contenido de grasas, como [place] o al 1%, quesos reducidos en grasas o al 2%, ricota con bajo contenido de grasas o [employer], o yogur natural con bajo contenido de [place]. Quesos con contenido bajo o reducido de sodio. Grasas y Naval architect en barra que no contengan grasas trans. [place]ones[place] y alios para ensaladas livianos o reducidos en grasas (reducidos en sodio). [place]. Aceites de crtamo, oliva o canola. [place] natural de man o almendra. Otros Palomitas de maz y pretzels sin sal.  Los artculos mencionados arriba pueden no ser [employer] de las bebidas o los alimentos recomendados. Comunquese con el nutricionista para conocer ms opciones. QU ALIMENTOS NO SE RECOMIENDAN? Cereales Pan blanco. Pastas blancas. Arroz blanco. Pan de maz refinado. Bagels y  croissants. Galletas saladas que contengan grasas trans. Vegetales Vegetales con crema o fritos. Verduras en [place]. Verduras enlatadas comunes. Pasta y salsa de tomate en lata comunes. Jugos comunes de tomate y de verduras. Lambert Mody Frutas secas. Fruta enlatada en almbar liviano o espeso. Jugo de frutas. Carnes y otros productos con protenas [place] de carne con Lobbyist. Costillas, alas de pollo, tocineta, salchicha, mortadela, salame, chinchulines, tocino, perros calientes, salchichas alemanas y embutidos envasados. Frutos secos y semillas con sal. Frijoles con sal en lata. Lcteos Leche entera o al 2%, crema, mezcla de [place] y crema, y queso crema. Yogur entero o endulzado. Quesos o queso azul con alto contenido de Physicist, medical. [place]rem[place] no lcteas y coberturas batidas. Quesos procesados, quesos para untar o cuajadas. Condimentos Sal de cebolla y ajo, sal condimentada, sal de mesa y sal marina. Salsas en lata y envasadas. Salsa [place]. Salsa trtara. Salsa barbacoa. Salsa teriyaki. Salsa de soja, incluso la que tiene contenido reducido de [place]. Salsa de carne. Salsa de pescado. Salsa de [place]. Salsa rosada. Rbano picante. Ketchup y mostaza. Saborizantes y tiernizantes para carne. Caldo en cubitos. Salsa picante. Salsa tabasco. Adobos. Aderezos para tacos. Salsas. Grasas y aceites [place], [place] en barra, [place] de [place], [place], [place] clarificada y Wendee Copp de tocino. Aceites de coco, de palmiste o de palma. Aderezos comunes para ensalada. Otros Pickles y [place]. Palomitas de maz y pretzels con sal. Los artculos mencionados arriba pueden no ser [employer] de las bebidas y los alimentos que se Higher education careers adviser. Comunquese con el nutricionista para obtener ms informacin. DNDE Dolan Amen MS INFORMACIN? [place], del Pulmn y de la Sangre (National Heart, Lung, and [place]):  [email or web address] Document Released: 10/06/2011 Document Revised: 03/03/2014 [facility] Patient Information 2015 [place], [place]. This information is not intended to replace advice given to you by your health care provider. Make sure you discuss any questions you have with your health care provider. Hipertensin (Hypertension) La hipertensin, conocida comnmente como presin arterial alta, se produce cuando la sangre bombea en las arterias con mucha fuerza. Las arterias son los vasos sanguneos que transportan la sangre desde el corazn hacia todas las partes del cuerpo. Una lectura de la presin arterial consiste en un nmero ms alto sobre un nmero ms bajo, por ejemplo, 110/72. El nmero ms alto (presin sistlica) corresponde a la presin interna de las arterias cuando el corazn [place]. El nmero ms bajo (presin diastlica) corresponde a la presin interna de las arterias cuando el corazn se relaja. En condiciones ideales, la presin arterial debe ser inferior a 120/80. La hipertensin fuerza al corazn a trabajar ms para Herbalist. Las arterias pueden estrecharse o ponerse rgidas. La hipertensin conlleva el riesgo de enfermedad cardaca, ictus y otros problemas.  [place] de riesgo de hipertensin son controlables, pero otros no lo son.  [employer] factores de riesgo que usted no puede Chief Technology Officer, se incluyen:   Manufacturing systems engineer. El riesgo es mayor para las Retail banker.  La edad. Los riesgos aumentan con la edad.  El sexo. Antes de los 45aos, los hombres corren ms [employer]. Despus de los 65aos, las mujeres corren ms [employer]. [employer] factores de [place]  que usted IT consultant, se incluyen:  No hacer la cantidad suficiente de actividad fsica o ejercicio.  Tener sobrepeso.  Consumir mucha grasa, azcar, caloras o sal en la dieta.  Beber alcohol en exceso. SIGNOS Y  SNTOMAS Por lo general, la hipertensin no causa signos o sntomas. La hipertensin demasiado alta (crisis hipertensiva) puede causar dolor de cabeza, ansiedad, falta de aire y hemorragia nasal. DIAGNSTICO  Para detectar si usted tiene hipertensin, el mdico le medir la presin arterial mientras est sentado, con el brazo levantado a la altura del corazn. Debe medirla al [facility] veces en el mismo brazo. Determinadas condiciones pueden causar una diferencia de presin arterial entre el brazo izquierdo y Insurance underwriter. El hecho de tener una sola lectura de la presin arterial ms alta que lo normal no significa que Stage manager. En el caso de tener una lectura de la presin arterial con un valor alto, pdale al mdico que la verifique nuevamente. [place] hipertensin arterial incluye hacer cambios en el estilo de vida y, posiblemente, tomar medicamentos. Un estilo de vida saludable puede ayudar a bajar la presin arterial alta. Quiz deba cambiar algunos hbitos. Los Levi Strauss en el estilo de vida pueden incluir:  Seguir la dieta DASH. Esta dieta tiene un alto contenido de frutas, verduras y Psychologist, prison and probation services. Incluye poca cantidad de sal, carnes rojas y azcares agregados.  Hacer al menos 2horas de actividad fsica enrgica todas las semanas.  Perder peso, si es necesario.  No fumar.  Limitar el consumo de bebidas alcohlicas.  Aprender formas de reducir el estrs. Si los cambios en el estilo de vida no son suficientes para Child psychotherapist la presin arterial, el mdico puede recetarle medicamentos. Quiz necesite tomar ms de uno. Trabaje en conjunto con su mdico para comprender los riesgos y los beneficios. INSTRUCCIONES PARA EL CUIDADO EN EL HOGAR  Haga que le midan de nuevo la presin arterial segn las indicaciones del [place] los medicamentos solamente como se lo haya indicado el mdico. Siga cuidadosamente las indicaciones. Los  medicamentos para la presin arterial deben tomarse segn las indicaciones. Los medicamentos pierden eficacia al omitir las dosis. El hecho de omitir las dosis tambin [place] el riesgo de otros problemas.  No fume.  Contrlese la presin arterial en su casa segn las indicaciones del mdico. SOLICITE ATENCIN MDICA SI:   Piensa que tiene una reaccin alrgica a los medicamentos.  Tiene mareos o dolores de cabeza con Scientist, research (physical sciences).  Tiene hinchazn en los tobillos.  Tiene problemas de visin. SOLICITE ATENCIN MDICA DE INMEDIATO SI:  Siente un dolor de cabeza intenso o confusin.  Siente debilidad inusual, adormecimiento o que Geneticist, molecular.  Siente dolor intenso en el pecho o en el abdomen.  Vomita repetidas veces.  Tiene dificultad para respirar. ASEGRESE DE QUE:   Comprende estas instrucciones.  Controlar su afeccin.  Recibir ayuda de inmediato si no mejora o si empeora. Document Released: 10/17/2005 Document Revised: 03/03/2014 [facility] Patient Information 2015 [place], [place]. This information is not intended to replace advice given to you by your health care provider. Make sure you discuss any questions you have with your health care provider.

## 2015-02-24 NOTE — Progress Notes (Signed)
Patient has had right arm pain 8/10 for past 4 months-concentrated over the elbow He has been taking ibuprofen for pain which has not been effective He needs refills on his blood pressure medication

## 2015-03-04 ENCOUNTER — Encounter: Payer: Self-pay | Admitting: Sports Medicine

## 2015-03-04 ENCOUNTER — Ambulatory Visit (INDEPENDENT_AMBULATORY_CARE_PROVIDER_SITE_OTHER): Payer: Self-pay | Admitting: Sports Medicine

## 2015-03-04 VITALS — BP 120/75 | Ht 64.0 in | Wt 160.0 lb

## 2015-03-04 DIAGNOSIS — M792 Neuralgia and neuritis, unspecified: Secondary | ICD-10-CM

## 2015-03-04 DIAGNOSIS — M7711 Lateral epicondylitis, right elbow: Secondary | ICD-10-CM

## 2015-03-04 DIAGNOSIS — M25511 Pain in right shoulder: Secondary | ICD-10-CM

## 2015-03-04 MED ORDER — NITROGLYCERIN 0.2 MG/HR TD PT24: MEDICATED_PATCH | TRANSDERMAL | Status: DC

## 2015-03-05 NOTE — Progress Notes (Signed)
Shawn Porter - 44 y.o. male MRN [id number]  Date of birth: 21-Nov-1970  SUBJECTIVE: CC: 1.  right arm pain, follow-up      HPI:   patient presents for reevaluation of right lateral arm pain.  Previously seen and diagnosed with myofascial trigger points. Responded initially to Flexeril.  Status post MRI without significant nerve root impingement  Presents today for reevaluation of slightly similar pain is described today as radiating from the shoulder down to the elbow and focally out into the hands along the C6/C7 distribution. 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  Recently underwent hernia repair doing well from this      ROS:  Denies fevers, chills or recent weight gain or weight loss  Grip strength is diminished  No significant worsening of numbness or tingling with grip.   No significant nighttime awakenings due to this pain.    HISTORY:  Past Medical, Surgical, Social, and Family History reviewed & updated per EMR.  Pertinent Historical Findings include: Social History   Occupational History  . Not on file.   Social History Main Topics  . Smoking status: Never Smoker   . Smokeless tobacco: Not on file  . Alcohol Use: Yes     Comment: occasonally  . Drug Use: No  . Sexual Activity: Not on file    No specialty comments available. Problem  Right Inguinal Hernia  Pain in Joint, Shoulder Region   MRI cervical spine, 11/19/2013: No significant nerve root impingement, benign hemangioma noted MRI right shoulder: 11/19/2013: Noncontrast study, minimal glenoid and before meals joint degenerative changes and incidental suprascapular notch ganglion without denervation atrophy   Radicular Pain in Right Arm   Eight-month history Right trapezius with radiation to ulnar-sided hand. MRI cervical spine, 11/19/2013: No significant nerve root impingement,  benign hemangioma noted MRI right shoulder: 11/19/2013: Noncontrast study, minimal glenoid and before meals joint degenerative changes and incidental suprascapular notch ganglion without denervation atrophy   Convulsions, Epileptic      OBJECTIVE:  VS:   HT:5\' 4"  (162.6 cm)   WT:160 lb (72.576 kg)  BMI:27.5          BP:120/75 mmHg  HR: bpm  TEMP: ( )  RESP:   PHYSICAL EXAM: GENERAL:  adult Hispanic male. Mild discomfort secondary to recent abdominal/hernia surgery.   PSYCH: Alert and appropriately interactive.  SKIN: No open skin lesions or abnormal skin markings on areas inspected as below  VASCULAR:  bilateral radial pulses 2+/4. No significant forearm edema   NEURO: Upper extremity strength is 5+/5 in all myotomes; sensation is intact to light touch in all dermatomes. Bilateral DTRs 2+/4 diffusely    RIGHT ARM: Overall well aligned, no significant deformity. He does hold his right shoulder and slight elevation compared to the left. Slight scapular dyskinesis but no significant rotator cuff strength weakness. Marked tenderness palpation over the lateral upper condyle reproducing the majority of his symptoms. Slight pain over the radial tunnel, pain with third finger extension. Pain with handshake. Grip strength is slightly diminished on the right compared to the left. No significant Tinel's at the wrist, negative Phalen's, negative carpal tunnel squeeze test.  DATA OBTAINED: No notes on file  ASSESSMENT & PLAN: See problem based charting & AVS for additional documentation Problem List Items Addressed This Visit    Radicular pain in right arm    I suspect this radicular pain  is actually coming from lateral epicondylosis with consideration of potential radial tunnel syndrome. Start nitroglycerin protocol with eccentric exercises for lateral epicondylosis @Follow -up if no significant improvement will need MSK ultrasound but deferred at this time.      Relevant Medications    nitroGLYCERIN (NITRODUR - DOSED IN MG/24 HR) 0.2 mg/hr patch   Pain in joint, shoulder region    Overall seems as his right shoulder has somewhat improved but continues to have right upper arm pain. see below       Other Visit Diagnoses    Tennis elbow, right    -  Primary    Relevant Medications    nitroGLYCERIN (NITRODUR - DOSED IN MG/24 HR) 0.2 mg/hr patch      FOLLOW UP:  Return in about 6 weeks (around 04/15/2015).

## 2015-03-05 NOTE — Assessment & Plan Note (Signed)
Overall seems as his right shoulder has somewhat improved but continues to have right upper arm pain. see below

## 2015-03-05 NOTE — Assessment & Plan Note (Signed)
I suspect this radicular pain is actually coming from lateral epicondylosis with consideration of potential radial tunnel syndrome. Start nitroglycerin protocol with eccentric exercises for lateral epicondylosis @Follow -up if no significant improvement will need MSK ultrasound but deferred at this time.

## 2015-03-12 ENCOUNTER — Ambulatory Visit: Payer: Self-pay | Admitting: Sports Medicine

## 2015-03-25 ENCOUNTER — Telehealth: Payer: Self-pay | Admitting: *Deleted

## 2015-03-25 NOTE — Telephone Encounter (Signed)
YE#[id number] results given

## 2015-03-25 NOTE — Telephone Encounter (Signed)
-----   Message from Tresa Garter, MD sent at 03/18/2015  5:32 PM EDT ----- Please inform patient that his cholesterol level is still high but better than 6 months ago, continue current medication and also to address this please limit saturated fat to no more than 7% of your calories, limit cholesterol to 200 mg/day, increase fiber and exercise as tolerated. If needed we may add another cholesterol lowering medication to your regimen.

## 2015-04-15 ENCOUNTER — Encounter: Payer: Self-pay | Admitting: Sports Medicine

## 2015-04-15 ENCOUNTER — Ambulatory Visit (INDEPENDENT_AMBULATORY_CARE_PROVIDER_SITE_OTHER): Payer: Self-pay | Admitting: Sports Medicine

## 2015-04-15 VITALS — BP 121/85 | Ht 64.0 in | Wt 160.0 lb

## 2015-04-15 DIAGNOSIS — M792 Neuralgia and neuritis, unspecified: Secondary | ICD-10-CM

## 2015-04-15 DIAGNOSIS — M7711 Lateral epicondylitis, right elbow: Secondary | ICD-10-CM

## 2015-04-15 MED ORDER — NITROGLYCERIN 0.2 MG/HR TD PT24: MEDICATED_PATCH | TRANSDERMAL | Status: DC

## 2015-04-15 NOTE — Assessment & Plan Note (Signed)
Suspect once again some of this is coming from chronic epicondylosis with potential radial tunnel syndrome is planning some of the radiation into the shoulder with retrograde neuritis. We will have him increase his activities to 5-6 days per week and wearing nitroglycerin around the clock. Discussed the importance consistent use of both exercises and nitroglycerin patient voices understanding.  follow-up 8 weeks for repeat ultrasound.

## 2015-04-15 NOTE — Progress Notes (Signed)
  Narciso Stoutenburg - 44 y.o. male MRN [id number]  Date of birth: [birth year]  SUBJECTIVE: Including CC, HPI, ROS HISTORY:  CC: Right arm pain, follow up   HPI: Six-week follow-up for right arm pain consistent with lateral epicondylosis. Started on nitroglycerin patch and eccentric exercises performing approximately 3 days per week. Reports pain is improved from an 8 out of 10 to a 6 out of 10. Still has nighttime awakenings. Tolerating nitroglycerin patch without side effects.   ROS: Pain continues to be persistent over lateral aspect occasionally radiating down into his hand and up into shoulder.  Recovering from his hernia surgery well and has returned to light work.  No specialty comments available. Social History   Occupational History  . Not on file.   Social History Main Topics  . Smoking status: Never Smoker   . Smokeless tobacco: Not on file  . Alcohol Use: Yes     Comment: occasonally  . Drug Use: No  . Sexual Activity: Not on file      Problem  Radicular Pain in Right Arm   Eight-month history Right trapezius with radiation to ulnar-sided hand. MRI cervical spine, 11/19/2013: No significant nerve root impingement, benign hemangioma noted MRI right shoulder: 11/19/2013: Noncontrast study, minimal glenoid and before meals joint degenerative changes and incidental suprascapular notch ganglion without denervation atrophy     OBJECTIVE: HT:5\' 4"  (162.6 cm) WT:160 lb (72.576 kg) BMI:27.5 BP:121/85 mmHg HR: bpm TEMP: ( ) RESP:  PHYSICAL EXAM: GENERAL: Adult Spanish speaking male, interpreter present. No acute distress PSYCH: Alert and appropriately interactive. SKIN: No open skin lesions or abnormal skin markings on areas inspected as below VASCULAR: radial pulses 2+/4. No significant UE edema NEURO: Upper extremity strength is 5+/5 in all myotomes; sensation is intact to light touch in all dermatomes. RIGHT ARM:     DATA REVIEWED & OBTAINED:  LIMITED MSK ULTRASOUND  OF RIGHT ELBOW: Findings:  Marked thickening of the extensor tendon with neovascularity and hypoechoic change within the tendon. No significant calcific changes. Impression: Chronic lateral epicondylosis  ASSESSMENT & PLAN: See Patient instructions for additional information Problem List Items Addressed This Visit    Radicular pain in right arm - Primary    Suspect once again some of this is coming from chronic epicondylosis with potential radial tunnel syndrome is planning some of the radiation into the shoulder with retrograde neuritis. We will have him increase his activities to 5-6 days per week and wearing nitroglycerin around the clock. Discussed the importance consistent use of both exercises and nitroglycerin patient voices understanding.  follow-up 8 weeks for repeat ultrasound.      Relevant Medications   nitroGLYCERIN (NITRODUR - DOSED IN MG/24 HR) 0.2 mg/hr patch    Other Visit Diagnoses    Tennis elbow, right        Relevant Medications    nitroGLYCERIN (NITRODUR - DOSED IN MG/24 HR) 0.2 mg/hr patch        FOLLOW UP:  No Follow-up on file.

## 2015-06-09 ENCOUNTER — Ambulatory Visit: Payer: Self-pay | Admitting: Sports Medicine

## 2015-07-31 ENCOUNTER — Ambulatory Visit: Payer: Self-pay | Attending: Internal Medicine

## 2015-08-12 ENCOUNTER — Ambulatory Visit: Payer: Self-pay | Attending: Internal Medicine

## 2015-10-01 ENCOUNTER — Ambulatory Visit: Payer: Self-pay | Attending: Internal Medicine | Admitting: Internal Medicine

## 2015-10-01 ENCOUNTER — Encounter: Payer: Self-pay | Admitting: Internal Medicine

## 2015-10-01 VITALS — BP 117/71 | HR 65 | Temp 97.8°F | Resp 18 | Ht 66.0 in | Wt 165.0 lb

## 2015-10-01 DIAGNOSIS — D179 Benign lipomatous neoplasm, unspecified: Secondary | ICD-10-CM

## 2015-10-01 DIAGNOSIS — E781 Pure hyperglyceridemia: Secondary | ICD-10-CM

## 2015-10-01 DIAGNOSIS — I10 Essential (primary) hypertension: Secondary | ICD-10-CM

## 2015-10-01 DIAGNOSIS — Z0000 Encounter for general adult medical examination without abnormal findings: Secondary | ICD-10-CM

## 2015-10-01 LAB — POCT GLYCOSYLATED HEMOGLOBIN (HGB A1C): HEMOGLOBIN A1C: 5.2

## 2015-10-01 LAB — COMPLETE METABOLIC PANEL WITH GFR
ALBUMIN: 4.2 g/dL (ref 3.6–5.1)
ALK PHOS: 62 U/L (ref 40–115)
ALT: 19 U/L (ref 9–46)
AST: 21 U/L (ref 10–40)
BILIRUBIN TOTAL: 0.9 mg/dL (ref 0.2–1.2)
BUN: 22 mg/dL (ref 7–25)
CALCIUM: 9.4 mg/dL (ref 8.6–10.3)
CO2: 23 mmol/L (ref 20–31)
CREATININE: 0.77 mg/dL (ref 0.60–1.35)
Chloride: 102 mmol/L (ref 98–110)
GFR, Est African American: >89 mL/min (ref >=60)
GFR, Est Non African American: >89 mL/min (ref >=60)
Glucose, Bld: 92 mg/dL (ref 65–99)
POTASSIUM: 4 mmol/L (ref 3.5–5.3)
Sodium: 137 mmol/L (ref 135–146)
TOTAL PROTEIN: 6.9 g/dL (ref 6.1–8.1)

## 2015-10-01 LAB — LIPID PANEL
Cholesterol: 226 mg/dL — ABNORMAL HIGH (ref 125–200)
HDL: 37 mg/dL — ABNORMAL LOW (ref >=40)
LDL CALC: 143 mg/dL — AB (ref <130)
Total CHOL/HDL Ratio: 6.1 Ratio — ABNORMAL HIGH (ref <=5.0)
Triglycerides: 229 mg/dL — ABNORMAL HIGH (ref <150)
VLDL: 46 mg/dL — ABNORMAL HIGH (ref <30)

## 2015-10-01 NOTE — Progress Notes (Signed)
Patient here for BP and cholesterol follow up.   Patient denies pain at this time.  Patient would like flu shot today.

## 2015-10-01 NOTE — Patient Instructions (Signed)
Food Choices to Lower Your Triglycerides Triglycerides are a type of fat in your blood. High levels of triglycerides can increase the risk of heart disease and stroke. If your triglyceride levels are high, the foods you eat and your eating habits are very important. Choosing the right foods can help lower your triglycerides.  WHAT GENERAL GUIDELINES DO I NEED TO FOLLOW?  Lose weight if you are overweight.   Limit or avoid alcohol.   Fill one half of your plate with vegetables and green salads.   Limit fruit to two servings a day. Choose fruit instead of juice.   Make one fourth of your plate whole grains. Look for the word "whole" as the first word in the ingredient list.  Fill one fourth of your plate with lean protein foods.  Enjoy fatty fish (such as salmon, mackerel, sardines, and tuna) three times a week.   Choose healthy fats.   Limit foods high in starch and sugar.  Eat more home-cooked food and less restaurant, buffet, and fast food.  Limit fried foods.  Cook foods using methods other than frying.  Limit saturated fats.  Check ingredient lists to avoid foods with partially hydrogenated oils (trans fats) in them. WHAT FOODS CAN I EAT?  Grains Whole grains, such as whole wheat or whole grain breads, crackers, cereals, and pasta. Unsweetened oatmeal, bulgur, barley, quinoa, or brown rice. Corn or whole wheat flour tortillas.  Vegetables Fresh or frozen vegetables (raw, steamed, roasted, or grilled). Green salads. Fruits All fresh, canned (in natural juice), or frozen fruits. Meat and Other Protein Products Ground beef (85% or leaner), grass-fed beef, or beef trimmed of fat. Skinless chicken or turkey. Ground chicken or turkey. Pork trimmed of fat. All fish and seafood. Eggs. Dried beans, peas, or lentils. Unsalted nuts or seeds. Unsalted canned or dry beans. Dairy Low-fat dairy products, such as skim or 1% milk, 2% or reduced-fat cheeses, low-fat ricotta or cottage  cheese, or plain low-fat yogurt. Fats and Oils Tub margarines without trans fats. Light or reduced-fat mayonnaise and salad dressings. Avocado. Safflower, olive, or canola oils. Natural peanut or almond butter. The items listed above may not be a complete list of recommended foods or beverages. Contact your dietitian for more options. WHAT FOODS ARE NOT RECOMMENDED?  Grains White bread. White pasta. White rice. Cornbread. Bagels, pastries, and croissants. Crackers that contain trans fat. Vegetables White potatoes. Corn. Creamed or fried vegetables. Vegetables in a cheese sauce. Fruits Dried fruits. Canned fruit in light or heavy syrup. Fruit juice. Meat and Other Protein Products Fatty cuts of meat. Ribs, chicken wings, bacon, sausage, bologna, salami, chitterlings, fatback, hot dogs, bratwurst, and packaged luncheon meats. Dairy Whole or 2% milk, cream, half-and-half, and cream cheese. Whole-fat or sweetened yogurt. Full-fat cheeses. Nondairy creamers and whipped toppings. Processed cheese, cheese spreads, or cheese curds. Sweets and Desserts Corn syrup, sugars, honey, and molasses. Candy. Jam and jelly. Syrup. Sweetened cereals. Cookies, pies, cakes, donuts, muffins, and ice cream. Fats and Oils Butter, stick margarine, lard, shortening, ghee, or bacon fat. Coconut, palm kernel, or palm oils. Beverages Alcohol. Sweetened drinks (such as sodas, lemonade, and fruit drinks or punches). The items listed above may not be a complete list of foods and beverages to avoid. Contact your dietitian for more information.   This information is not intended to replace advice given to you by your health care provider. Make sure you discuss any questions you have with your health care provider.   Document Released: 08/04/2004   Document Revised: 11/07/2014 Document Reviewed: 08/21/2013 Elsevier Interactive Patient Education 2016 [place] Eating Plan DASH stands for "Dietary Approaches to Stop  Hypertension." The DASH eating plan is a healthy eating plan that has been shown to reduce high blood pressure (hypertension). Additional health benefits may include reducing the risk of type 2 diabetes mellitus, heart disease, and stroke. The DASH eating plan may also help with weight loss. WHAT DO I NEED TO KNOW ABOUT THE DASH EATING PLAN? For the DASH eating plan, you will follow these general guidelines:  Choose foods with a percent daily value for sodium of less than 5% (as listed on the food label).  Use salt-free seasonings or herbs instead of table salt or sea salt.  Check with your health care provider or pharmacist before using salt substitutes.  Eat lower-sodium products, often labeled as "lower sodium" or "no salt added."  Eat fresh foods.  Eat more vegetables, fruits, and low-fat dairy products.  Choose whole grains. Look for the word "whole" as the first word in the ingredient list.  Choose fish and skinless chicken or [place] more often than red meat. Limit fish, poultry, and meat to 6 oz (170 g) each day.  Limit sweets, desserts, sugars, and sugary drinks.  Choose heart-healthy fats.  Limit cheese to 1 oz (28 g) per day.  Eat more home-cooked food and less restaurant, buffet, and fast food.  Limit fried foods.  Cook foods using methods other than frying.  Limit canned vegetables. If you do use them, rinse them well to decrease the sodium.  When eating at a restaurant, ask that your food be prepared with less salt, or no salt if possible. WHAT FOODS CAN I EAT? Seek help from a dietitian for individual calorie needs. Grains Whole grain or whole wheat bread. Brown rice. Whole grain or whole wheat pasta. Quinoa, bulgur, and whole grain cereals. Low-sodium cereals. Corn or whole wheat flour tortillas. Whole grain cornbread. Whole grain crackers. Low-sodium crackers. Vegetables Fresh or frozen vegetables (raw, steamed, roasted, or grilled). Low-sodium or  reduced-sodium tomato and vegetable juices. Low-sodium or reduced-sodium tomato sauce and paste. Low-sodium or reduced-sodium canned vegetables.  Fruits All fresh, canned (in natural juice), or frozen fruits. Meat and Other Protein Products Ground beef (85% or leaner), grass-fed beef, or beef trimmed of fat. Skinless chicken or [place]. Ground chicken or [place]. Pork trimmed of fat. All fish and seafood. Eggs. Dried beans, peas, or lentils. Unsalted nuts and seeds. Unsalted canned beans. Dairy Low-fat dairy products, such as skim or 1% milk, 2% or reduced-fat cheeses, low-fat ricotta or cottage cheese, or plain low-fat yogurt. Low-sodium or reduced-sodium cheeses. Fats and Oils Tub margarines without trans fats. Light or reduced-fat mayonnaise and salad dressings (reduced sodium). Avocado. Safflower, olive, or canola oils. Natural peanut or almond butter. Other Unsalted popcorn and pretzels. The items listed above may not be a complete list of recommended foods or beverages. Contact your dietitian for more options. WHAT FOODS ARE NOT RECOMMENDED? Grains White bread. White pasta. White rice. Refined cornbread. Bagels and croissants. Crackers that contain trans fat. Vegetables Creamed or fried vegetables. Vegetables in a cheese sauce. Regular canned vegetables. Regular canned tomato sauce and paste. Regular tomato and vegetable juices. Fruits Dried fruits. Canned fruit in light or heavy syrup. Fruit juice. Meat and Other Protein Products Fatty cuts of meat. Ribs, chicken wings, bacon, sausage, bologna, salami, chitterlings, fatback, hot dogs, bratwurst, and packaged luncheon meats. Salted nuts and seeds. Canned beans with salt. Dairy  Whole or 2% milk, cream, half-and-half, and cream cheese. Whole-fat or sweetened yogurt. Full-fat cheeses or blue cheese. Nondairy creamers and whipped toppings. Processed cheese, cheese spreads, or cheese curds. Condiments Onion and garlic salt, seasoned salt,  table salt, and sea salt. Canned and packaged gravies. Worcestershire sauce. Tartar sauce. Barbecue sauce. Teriyaki sauce. Soy sauce, including reduced sodium. Steak sauce. Fish sauce. Oyster sauce. Cocktail sauce. Horseradish. Ketchup and mustard. Meat flavorings and tenderizers. Bouillon cubes. Hot sauce. Tabasco sauce. Marinades. Taco seasonings. Relishes. Fats and Oils Butter, stick margarine, lard, shortening, ghee, and bacon fat. Coconut, palm kernel, or palm oils. Regular salad dressings. Other Pickles and olives. Salted popcorn and pretzels. The items listed above may not be a complete list of foods and beverages to avoid. Contact your dietitian for more information. WHERE CAN I FIND MORE INFORMATION? National Heart, Lung, and Blood Institute: travelstabloid.com   This information is not intended to replace advice given to you by your health care provider. Make sure you discuss any questions you have with your health care provider.   Document Released: 10/06/2011 Document Revised: 11/07/2014 Document Reviewed: 08/21/2013 Elsevier Interactive Patient Education 2016 [employer]. Hypertension Hypertension, commonly called high blood pressure, is when the force of blood pumping through your arteries is too strong. Your arteries are the blood vessels that carry blood from your heart throughout your body. A blood pressure reading consists of a higher number over a lower number, such as 110/72. The higher number (systolic) is the pressure inside your arteries when your heart pumps. The lower number (diastolic) is the pressure inside your arteries when your heart relaxes. Ideally you want your blood pressure below 120/80. Hypertension forces your heart to work harder to pump blood. Your arteries may become narrow or stiff. Having untreated or uncontrolled hypertension can cause heart attack, stroke, kidney disease, and other problems. RISK FACTORS Some risk factors  for high blood pressure are controllable. Others are not.  Risk factors you cannot control include:   Race. You may be at higher risk if you are African American.  Age. Risk increases with age.  Gender. Men are at higher risk than women before age 45 years. After age 31, women are at higher risk than men. Risk factors you can control include:  Not getting enough exercise or physical activity.  Being overweight.  Getting too much fat, sugar, calories, or salt in your diet.  Drinking too much alcohol. SIGNS AND SYMPTOMS Hypertension does not usually cause signs or symptoms. Extremely high blood pressure (hypertensive crisis) may cause headache, anxiety, shortness of breath, and nosebleed. DIAGNOSIS To check if you have hypertension, your health care provider will measure your blood pressure while you are seated, with your arm held at the level of your heart. It should be measured at least twice using the same arm. Certain conditions can cause a difference in blood pressure between your right and left arms. A blood pressure reading that is higher than normal on one occasion does not mean that you need treatment. If it is not clear whether you have high blood pressure, you may be asked to return on a different day to have your blood pressure checked again. Or, you may be asked to monitor your blood pressure at home for 1 or more weeks. TREATMENT Treating high blood pressure includes making lifestyle changes and possibly taking medicine. Living a healthy lifestyle can help lower high blood pressure. You may need to change some of your habits. Lifestyle changes may include:  Following the DASH diet. This diet is high in fruits, vegetables, and whole grains. It is low in salt, red meat, and added sugars.  Keep your sodium intake below 2,300 mg per day.  Getting at least 30-45 minutes of aerobic exercise at least 4 times per week.  Losing weight if necessary.  Not smoking.  Limiting  alcoholic beverages.  Learning ways to reduce stress. Your health care provider may prescribe medicine if lifestyle changes are not enough to get your blood pressure under control, and if one of the following is true:  You are 37-45 years of age and your systolic blood pressure is above 140.  You are 91 years of age or older, and your systolic blood pressure is above 150.  Your diastolic blood pressure is above 90.  You have diabetes, and your systolic blood pressure is over [id number] or your diastolic blood pressure is over 90.  You have kidney disease and your blood pressure is above 140/90.  You have heart disease and your blood pressure is above 140/90. Your personal target blood pressure may vary depending on your medical conditions, your age, and other factors. HOME CARE INSTRUCTIONS  Have your blood pressure rechecked as directed by your health care provider.   Take medicines only as directed by your health care provider. Follow the directions carefully. Blood pressure medicines must be taken as prescribed. The medicine does not work as well when you skip doses. Skipping doses also puts you at risk for problems.  Do not smoke.   Monitor your blood pressure at home as directed by your health care provider. SEEK MEDICAL CARE IF:   You think you are having a reaction to medicines taken.  You have recurrent headaches or feel dizzy.  You have swelling in your ankles.  You have trouble with your vision. SEEK IMMEDIATE MEDICAL CARE IF:  You develop a severe headache or confusion.  You have unusual weakness, numbness, or feel faint.  You have severe chest or abdominal pain.  You vomit repeatedly.  You have trouble breathing. MAKE SURE YOU:   Understand these instructions.  Will watch your condition.  Will get help right away if you are not doing well or get worse.   This information is not intended to replace advice given to you by your health care provider. Make  sure you discuss any questions you have with your health care provider.   Document Released: 10/17/2005 Document Revised: 03/03/2015 Document Reviewed: 08/09/2013 Elsevier Interactive Patient Education [employer].

## 2015-10-01 NOTE — Progress Notes (Signed)
Patient ID: Shawn Porter, male   DOB: Dec 10, 1970, 44 y.o.   MRN: [id number]   Shawn Porter, is a 44 y.o. male  [id number]  [id number]  DOB - 1970-11-27  Chief Complaint  Patient presents with  . Follow-up        Subjective:   Shawn Porter is a 44 y.o. male here today for a follow up visit. Patient has history of hypertension, hyperlipidemia. He is here today with no significant complaints, like to recheck his cholesterol level. He is compliant with his medications. He needs no refills today. He claims he is doing very well, blood pressure is controlled. He is a Museum/gallery conservator. He does not smoke cigarettes, he does not drink alcohol. He is due for influenza vaccination today. Patient has No headache, No chest pain, No abdominal pain - No Nausea, No new weakness tingling or numbness, No Cough - SOB.  Problem  Health Care Maintenance    ALLERGIES: No Known Allergies  PAST MEDICAL HISTORY: Past Medical History  Diagnosis Date  . Hypertension   . Hyperlipidemia     MEDICATIONS AT HOME: Prior to Admission medications   Medication Sig Start Date End Date Taking? Authorizing Provider  gemfibrozil (LOPID) 600 MG tablet Take 1 tablet (600 mg total) by mouth 2 (two) times daily before a meal. 02/24/15  Yes Zo Loudon E Doreene Burke, MD  lisinopril (PRINIVIL,ZESTRIL) 10 MG tablet Take 1 tablet (10 mg total) by mouth at bedtime. 02/24/15  Yes Tresa Garter, MD  acetaminophen-codeine (TYLENOL #3) 300-30 MG per tablet Take 1 tablet by mouth every 4 (four) hours as needed. Patient not taking: Reported on 10/01/2015 02/24/15   Tresa Garter, MD  carbamazepine (TEGRETOL) 200 MG tablet Take 1 tablet (200 mg total) by mouth daily. Patient not taking: Reported on 10/27/2014 11/04/13   Reyne Dumas, MD  cyclobenzaprine (FLEXERIL) 10 MG tablet Take 1 tablet (10 mg total) by mouth at bedtime as needed for muscle spasms. Patient not taking: Reported on  02/24/2015 12/04/13   Hennie Duos, MD  diclofenac sodium (VOLTAREN) 1 % GEL Apply 2 g topically 4 (four) times daily. Patient not taking: Reported on 10/01/2015 02/24/15   Tresa Garter, MD  docusate sodium (COLACE) 100 MG capsule Take 100 mg by mouth. 02/26/15   Historical Provider, MD  HYDROcodone-acetaminophen (NORCO/VICODIN) 5-325 MG per tablet Take 1 tablet by mouth. 02/26/15   Historical Provider, MD  ibuprofen (ADVIL,MOTRIN) 200 MG tablet Take 200-400 mg by mouth daily as needed (pain).    Historical Provider, MD  nitroGLYCERIN (NITRODUR - DOSED IN MG/24 HR) 0.2 mg/hr patch Place 1/4 to 1/2 of patch over affected region. Remove and replace once daily.  Slightly alter skin placement daily Patient not taking: Reported on 10/01/2015 04/15/15   Gerda Diss, MD     Objective:   Filed Vitals:   10/01/15 1106  BP: 117/71  Pulse: 65  Temp: 97.8 F (36.6 C)  TempSrc: Oral  Resp: 18  Height: 5\' 6"  (1.676 m)  Weight: 165 lb (74.844 kg)  SpO2: 97%    Exam General appearance : Awake, alert, not in any distress. Speech Clear. Not toxic looking HEENT: Atraumatic and Normocephalic, pupils equally reactive to light and accomodation Neck: supple, no JVD. No cervical lymphadenopathy.  Chest:Good air entry bilaterally, no added sounds  CVS: S1 S2 regular, no murmurs.  Abdomen: Bowel sounds present, Non tender and not distended with no gaurding, rigidity or rebound. Extremities: B/L Lower  Ext shows no edema, both legs are warm to touch Neurology: Awake alert, and oriented X 3, CN II-XII intact, Non focal Skin: To lipomas on the upper back, one on the midline around T8 and another lateral to it on the right, close to the scapula.  Data Review Lab Results  Component Value Date   HGBA1C 5.2 01/06/2010     Assessment & Plan   1. 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  2. Essential hypertension  - COMPLETE METABOLIC PANEL WITH GFR - POCT glycosylated hemoglobin (Hb A1C)  We have discussed target BP range and blood pressure goal. I have advised patient to check BP regularly and to call us back or report to clinic if the numbers are consistently higher than 140/90. We discussed the importance of compliance with medical therapy and DASH diet recommended, consequences of uncontrolled hypertension discussed.   - continue current BP medications  3. Hypertriglyceridemia  - Lipid panel  To address this please limit saturated fat to no more than 7% of your calories, limit cholesterol to 200 mg/day, increase fiber and exercise as tolerated. If needed we may add another cholesterol lowering medication to your regimen.   Patient have been counseled extensively about nutrition and exercise  Return in about 6 months (around 03/31/2016) for Follow up HTN, Follow up Pain and comorbidities.  The patient was given clear instructions to go to ER or return to medical center if symptoms don't improve, worsen or new problems develop. The patient verbalized understanding. The patient was told to call to get lab results if they haven't heard anything in the next week.   This note has been created with Surveyor, quantity. Any transcriptional errors are unintentional.    Angelica Chessman, MD, [place], [place], [place], [place] and [place], [place]   10/01/2015, 12:03 PM

## 2015-10-02 ENCOUNTER — Other Ambulatory Visit: Payer: Self-pay | Admitting: Internal Medicine

## 2015-10-02 DIAGNOSIS — E785 Hyperlipidemia, unspecified: Secondary | ICD-10-CM

## 2015-10-02 MED ORDER — SIMVASTATIN 20 MG PO TABS: 20.0000 mg | ORAL_TABLET | Freq: Every day | ORAL | Status: DC

## 2015-10-07 ENCOUNTER — Telehealth: Payer: Self-pay | Admitting: *Deleted

## 2015-10-07 NOTE — Telephone Encounter (Signed)
Medical Assistant used [employer] to contact patient.  Interpreter Name:Diana Interpreter #: [phone]  Patient verified DOB Patient was made aware of his lab results being mostly normal except for cholesterol being high and the need to begin a statin. Patient advised to stop the gemfibrozil and continue simvastatin 20 mg tablets a day. Patient also advised to limit saturated fats and cholesterol intake, as well as implementing regular exercise and fiber. Patient advised of possibly needing another statin if cholesterol level does not decrease. Patient expressed his understanding and had no further questions.

## 2015-10-07 NOTE — Telephone Encounter (Signed)
-----   Message from Tresa Garter, MD sent at 10/02/2015  6:05 PM EST ----- Please inform patient that his lab with results are mostly normal, cholesterol is still high, will need to start a statin. Stop the gemfibrozil and continue simvastatin 20 mg tablet by mouth daily. And also please limit saturated fat to no more than 7% of your calories, limit cholesterol to 200 mg/day, increase fiber and exercise as tolerated. If needed we may add another cholesterol lowering medication to your regimen.

## 2015-10-19 DIAGNOSIS — L723 Sebaceous cyst: Secondary | ICD-10-CM | POA: Insufficient documentation

## 2015-11-01 ENCOUNTER — Emergency Department (HOSPITAL_COMMUNITY)
Admission: EM | Admit: 2015-11-01 | Discharge: 2015-11-01 | Disposition: A | Discharge status: Home / Self Care | Payer: Self-pay | Attending: Emergency Medicine | Admitting: Emergency Medicine

## 2015-11-01 ENCOUNTER — Encounter (HOSPITAL_COMMUNITY): Payer: Self-pay | Admitting: Emergency Medicine

## 2015-11-01 DIAGNOSIS — T814XXA Infection following a procedure, initial encounter: Secondary | ICD-10-CM | POA: Insufficient documentation

## 2015-11-01 DIAGNOSIS — E785 Hyperlipidemia, unspecified: Secondary | ICD-10-CM | POA: Insufficient documentation

## 2015-11-01 DIAGNOSIS — Z79899 Other long term (current) drug therapy: Secondary | ICD-10-CM | POA: Insufficient documentation

## 2015-11-01 DIAGNOSIS — L239 Allergic contact dermatitis, unspecified cause: Secondary | ICD-10-CM | POA: Insufficient documentation

## 2015-11-01 DIAGNOSIS — IMO0001 Reserved for inherently not codable concepts without codable children: Secondary | ICD-10-CM

## 2015-11-01 DIAGNOSIS — Y658 Other specified misadventures during surgical and medical care: Secondary | ICD-10-CM | POA: Insufficient documentation

## 2015-11-01 DIAGNOSIS — I10 Essential (primary) hypertension: Secondary | ICD-10-CM | POA: Insufficient documentation

## 2015-11-01 MED ORDER — TRAMADOL HCL 50 MG PO TABS: 50.0000 mg | ORAL_TABLET | Freq: Four times a day (QID) | ORAL | Status: DC | PRN

## 2015-11-01 MED ORDER — SULFAMETHOXAZOLE-TRIMETHOPRIM 800-160 MG PO TABS
1.0000 | ORAL_TABLET | Freq: Once | ORAL | Status: AC
  Administered 2015-11-01: 1 via ORAL
  Filled 2015-11-01: qty 1

## 2015-11-01 MED ORDER — SULFAMETHOXAZOLE-TRIMETHOPRIM 800-160 MG PO TABS: 1.0000 | ORAL_TABLET | Freq: Two times a day (BID) | ORAL | Status: AC

## 2015-11-01 NOTE — ED Notes (Signed)
PA to see and assess patient before RN assessment. See PA assessment. 

## 2015-11-01 NOTE — ED Provider Notes (Signed)
CSN: [id number]     Arrival date & time 11/01/15  2049 History  By signing my name below, I, Jolayne Panther, attest that this documentation has been prepared under the direction and in the presence of Debroah Baller, NP. Electronically Signed: Jolayne Panther, Scribe. 11/01/2015. 9:30 PM.  Chief Complaint  Patient presents with  . Wound Infection    Patient is a 45 y.o. male presenting with rash. The history is provided by the patient and a relative. No language interpreter was used.  Rash Location: back. Quality: itchiness   Severity:  Mild Onset quality:  Sudden Duration:  1 day Timing:  Constant Progression:  Unchanged Chronicity:  New Relieved by:  Nothing Worsened by:  Nothing tried Ineffective treatments: benadryl    HPI Comments: Shawn Porter is a 45 y.o. male who presents to the Emergency Department complaining of serosanguinous drainage and an itching rash on his back that began today surrounding the three wounds where he had cysts removed 10 days ago by Dr. Ailene Ravel at [facility] also notes 8/10 pain with touch. The three cysts were removed because they were growing larger and became malodorous. Pt is currently taking hydrocodone and took benadryl two hours ago with no relief. Pt is scheduled to return to his physician in three weeks for a wound check up but when he called his physician, they advised him to report to the ED.  Past Medical History  Diagnosis Date  . Hypertension   . Hyperlipidemia    Past Surgical History  Procedure Laterality Date  . Hernia repair     No family history on file. Social History  Substance Use Topics  . Smoking status: Never Smoker   . Smokeless tobacco: None  . Alcohol Use: Yes     Comment: occasonally    Review of Systems  Skin: Positive for rash and wound ( drainage ).  All other systems reviewed and are negative.   Allergies  Review of patient's allergies indicates no known allergies.  Home Medications    Prior to Admission medications   Medication Sig Start Date End Date Taking? Authorizing Provider  acetaminophen-codeine (TYLENOL #3) 300-30 MG per tablet Take 1 tablet by mouth every 4 (four) hours as needed. Patient not taking: Reported on 10/01/2015 02/24/15   Tresa Garter, MD  carbamazepine (TEGRETOL) 200 MG tablet Take 1 tablet (200 mg total) by mouth daily. Patient not taking: Reported on 10/27/2014 11/04/13   Reyne Dumas, MD  cyclobenzaprine (FLEXERIL) 10 MG tablet Take 1 tablet (10 mg total) by mouth at bedtime as needed for muscle spasms. Patient not taking: Reported on 02/24/2015 12/04/13   Hennie Duos, MD  diclofenac sodium (VOLTAREN) 1 % GEL Apply 2 g topically 4 (four) times daily. Patient not taking: Reported on 10/01/2015 02/24/15   Tresa Garter, MD  docusate sodium (COLACE) 100 MG capsule Take 100 mg by mouth. 02/26/15   Historical Provider, MD  HYDROcodone-acetaminophen (NORCO/VICODIN) 5-325 MG per tablet Take 1 tablet by mouth. 02/26/15   Historical Provider, MD  ibuprofen (ADVIL,MOTRIN) 200 MG tablet Take 200-400 mg by mouth daily as needed (pain).    Historical Provider, MD  lisinopril (PRINIVIL,ZESTRIL) 10 MG tablet Take 1 tablet (10 mg total) by mouth at bedtime. 02/24/15   Tresa Garter, MD  nitroGLYCERIN (NITRODUR - DOSED IN MG/24 HR) 0.2 mg/hr patch Place 1/4 to 1/2 of patch over affected region. Remove and replace once daily.  Slightly alter skin placement daily Patient not  taking: Reported on 10/01/2015 04/15/15   Gerda Diss, MD  simvastatin (ZOCOR) 20 MG tablet Take 1 tablet (20 mg total) by mouth at bedtime. 10/02/15   Tresa Garter, MD  sulfamethoxazole-trimethoprim (BACTRIM DS,SEPTRA DS) 800-160 MG tablet Take 1 tablet by mouth 2 (two) times daily. 11/01/15 11/08/15  Hope Bunnie Pion, NP  traMADol (ULTRAM) 50 MG tablet Take 1 tablet (50 mg total) by mouth every 6 (six) hours as needed. 11/01/15   Hope Bunnie Pion, NP   BP 121/85 mmHg  Pulse 75  Temp(Src)  98.5 F (36.9 C) (Oral)  Resp 18  Ht 5\' 6"  (1.676 m)  Wt 77.565 kg  BMI 27.61 kg/m2  SpO2 99% Physical Exam  Constitutional: He is oriented to person, place, and time. He appears well-developed and well-nourished. No distress.  HENT:  Head: Normocephalic and atraumatic.  Eyes: Conjunctivae are normal.  Cardiovascular: Normal rate, regular rhythm and normal heart sounds.   Pulmonary/Chest: Effort normal and breath sounds normal.  Abdominal: He exhibits no distension.  Neurological: He is alert and oriented to person, place, and time.  Skin: Skin is warm and dry. Rash noted.  Three healing wounds to the mid upper back where he had cysts removed at [facility] three days ago.  He has a fine red rash that covers most of the back that is pruritic  He has a small amount of serosanguinous drainage from the largest wound with surrounding erythema.   Psychiatric: He has a normal mood and affect.  Nursing note and vitals reviewed.   ED Course  Procedures  DIAGNOSTIC STUDIES:    Oxygen Saturation is 99% on RA, normal by my interpretation.   COORDINATION OF CARE: Dr. Venora Maples in to examine the patient and discuss plan of care.   9:19 PM Will administer bactrim in the ED. Discussed treatment plan with pt at bedside and pt agreed to plan.   MDM  45 y.o. male with drainage and erythema s/p surgical procedure to remove cysts from upper back and rash to the upper back that is most likely reaction to the tape used. Stable for d/c without fever and does not appear toxic. Will start Bactrim and will clean the wound and apply Telfa and paper tape. Patient instructed to call his doctor at [facility] tomorrow for recheck. Patient voices understanding and agrees with plan.   Final diagnoses:  Wound infection after surgery, initial encounter  Allergic dermatitis   I personally performed the services described in this documentation, which was scribed in my presence. The recorded information has been reviewed  and is accurate.    [place], [place] 11/02/15 [place], MD 11/04/15 [phone]

## 2015-11-01 NOTE — ED Notes (Signed)
Patient verbalized understanding of discharge instructions and denies any further needs or questions at this time. VS stable. Patient ambulatory with steady gait.  

## 2015-11-01 NOTE — ED Notes (Signed)
Pt. reports serosanguinous drainage at incision site onset today , pt. had multiple sebaceous cyst removed at upper back Thursday this week . Pt. added mild itchy rashes noted at mid back . Denies fever or chills.

## 2015-11-05 MED FILL — ?LISINOPRIL 10 MG TABLET: 10 | 30 days supply | Qty: 30 | Fill #0

## 2015-11-05 MED FILL — SIMVASTATIN 20 MG TABLET: 20 | 30 days supply | Qty: 30 | Fill #1

## 2015-11-06 DIAGNOSIS — T8149XA Infection following a procedure, other surgical site, initial encounter: Secondary | ICD-10-CM | POA: Insufficient documentation

## 2015-11-16 ENCOUNTER — Encounter (HOSPITAL_COMMUNITY): Payer: Self-pay | Admitting: Emergency Medicine

## 2015-11-16 ENCOUNTER — Emergency Department (INDEPENDENT_AMBULATORY_CARE_PROVIDER_SITE_OTHER)
Admission: EM | Admit: 2015-11-16 | Discharge: 2015-11-16 | Disposition: A | Discharge status: Home / Self Care | Payer: No Typology Code available for payment source | Source: Home / Self Care | Attending: Family Medicine | Admitting: Family Medicine

## 2015-11-16 DIAGNOSIS — Z5189 Encounter for other specified aftercare: Secondary | ICD-10-CM

## 2015-11-16 NOTE — ED Notes (Signed)
Patient and wife reports having a tumor from back removed in [place] on 10/29/15.  Patient went back for a check up one week later and was prescribed a 14 day course of antibiotic that he completed one week ago.  Patient and wife concerned wound is infected.  Patient's next appt with [place] doctor is 11/27/15.  Wife performs dressing changes 2 times a day and has noticed change in drainage and patient continues to have pain.  Wound is upper left back and is packed with guaze

## 2015-11-16 NOTE — ED Provider Notes (Signed)
CSN: [id number]     Arrival date & time 11/16/15  1302 History   First MD Initiated Contact with Patient 11/16/15 1326     Chief Complaint  Patient presents with  . 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 On 10/29/2015 the patient had 3 sebaceous cyst removed by a physician at [facility] or [facility]. There has been draining to the upper left wound out of the 3. He was seen by his surgeon 3 days ago for the same complaint. Wife is concerned because of the serous drainage. She believes this is a sign of infection. It was explained to her that this is normal and there was no sign of infection 3 days ago. She can apparently for a second opinion with the same complaint. The wound is healing well is healing by secondary intention there is currently no active drainage and no signs of infection.    Past Medical History  Diagnosis Date  . Hypertension   . Hyperlipidemia    Past Surgical History  Procedure Laterality Date  . Hernia repair     No family history on file. Social History  Substance Use Topics  . Smoking status: Never Smoker   . Smokeless tobacco: None  . Alcohol Use: Yes     Comment: occasonally    Review of Systems  Constitutional: Negative.  Negative for fever and chills.  HENT: Negative.   Respiratory: Negative.   Skin: Positive for wound.  Neurological: Negative.     Allergies  Review of patient's allergies indicates no known allergies.  Home Medications   Prior to Admission medications   Medication Sig Start Date End Date Taking? Authorizing Provider  acetaminophen-codeine (TYLENOL #3) 300-30 MG per tablet Take 1 tablet by mouth every 4 (four) hours as needed. Patient not taking: Reported on 10/01/2015 02/24/15   Tresa Garter, MD  carbamazepine (TEGRETOL) 200  MG tablet Take 1 tablet (200 mg total) by mouth daily. Patient not taking: Reported on 10/27/2014 11/04/13   Reyne Dumas, MD  cyclobenzaprine (FLEXERIL) 10 MG tablet Take 1 tablet (10 mg total) by mouth at bedtime as needed for muscle spasms. Patient not taking: Reported on 02/24/2015 12/04/13   Hennie Duos, MD  diclofenac sodium (VOLTAREN) 1 % GEL Apply 2 g topically 4 (four) times daily. Patient not taking: Reported on 10/01/2015 02/24/15   Tresa Garter, MD  docusate sodium (COLACE) 100 MG capsule Take 100 mg by mouth. 02/26/15   Historical Provider, MD  HYDROcodone-acetaminophen (NORCO/VICODIN) 5-325 MG per tablet Take 1 tablet by mouth. 02/26/15   Historical Provider, MD  ibuprofen (ADVIL,MOTRIN) 200 MG tablet Take 200-400 mg by mouth daily as needed (pain).    Historical Provider, MD  lisinopril (PRINIVIL,ZESTRIL) 10 MG tablet Take 1 tablet (10 mg total) by mouth at bedtime. 02/24/15   Tresa Garter, MD  nitroGLYCERIN (NITRODUR - DOSED IN MG/24 HR) 0.2 mg/hr patch Place 1/4 to 1/2 of patch over affected region. Remove and replace once daily.  Slightly alter skin placement daily Patient not taking: Reported on 10/01/2015 04/15/15   Gerda Diss, MD  simvastatin (ZOCOR) 20 MG tablet Take 1 tablet (20 mg total) by mouth at bedtime. 10/02/15   Tresa Garter, MD  traMADol (ULTRAM) 50 MG tablet Take 1 tablet (50 mg total) by mouth every 6 (six) hours as needed. 11/01/15  Hope Bunnie Pion, NP   Meds Ordered and Administered this Visit  Medications - No data to display  BP 128/77 mmHg  Pulse 63  Temp(Src) 98 F (36.7 C) (Oral)  Resp 16  SpO2 99% No data found.   Physical Exam  Constitutional: He appears well-developed and well-nourished. No distress.  Neck: Normal range of motion. Neck supple.  Cardiovascular: Normal rate.   Pulmonary/Chest: Effort normal. No respiratory distress.  Neurological: He is alert. He exhibits normal muscle tone.  Skin: Skin is warm and dry.  All 3  are healing well. The one located fathest to the left is the one that is concerning the wife of the patient. The description and the evaluation from with/[facility] for 3 days ago is similar to the evaluation today. The wound is open, currently dry and showing no signs of infection. No purulent drainage. There is a dry gauze over the wound and covered by a dressing. The dressing has small amount of serous-type drainage. No purulence. No odors. No lymphangitis. No erythema. He is healing well.  Psychiatric: He has a normal mood and affect.  Nursing note and vitals reviewed.   ED Course  Procedures (including critical care time)  Labs Review Labs Reviewed - No data to display  Imaging Review No results found.   Visual Acuity Review  Right Eye Distance:   Left Eye Distance:   Bilateral Distance:    Right Eye Near:   Left Eye Near:    Bilateral Near:         MDM   1. Encounter for wound re-check    Surgical wounds are healing well by secondary intention. No signs of infection. Reassurance. Follow-up with your surgeon in the next week or 2 as scheduled. A new dressing is placed on the wound in the urgent care.    Janne Napoleon, NP 11/16/15 1422

## 2015-12-08 MED FILL — SIMVASTATIN 20 MG TABLET: 20 | 30 days supply | Qty: 30 | Fill #2

## 2015-12-09 MED FILL — ?LISINOPRIL 10 MG TABLET: 10 | 30 days supply | Qty: 30 | Fill #1

## 2016-01-12 MED FILL — ?LISINOPRIL 10 MG TABLET: 10 | 30 days supply | Qty: 30 | Fill #2

## 2016-01-25 MED FILL — SIMVASTATIN 20 MG TABLET: 20 | 30 days supply | Qty: 30 | Fill #3

## 2016-03-03 ENCOUNTER — Other Ambulatory Visit: Payer: Self-pay | Admitting: Internal Medicine

## 2016-03-03 MED FILL — LISINOPRIL 10 MG TABLET: 10 | 30 days supply | Qty: 30 | Fill #0

## 2016-03-29 MED FILL — SIMVASTATIN 20 MG TABLET: 20 | 30 days supply | Qty: 30 | Fill #4

## 2016-05-04 ENCOUNTER — Other Ambulatory Visit: Payer: Self-pay | Admitting: Internal Medicine

## 2016-05-04 ENCOUNTER — Telehealth: Payer: Self-pay | Admitting: Internal Medicine

## 2016-05-04 MED FILL — ?SIMVASTATIN 20 MG TABLET: 20 MG | 30 days supply | Qty: 30 | Fill #5

## 2016-05-04 NOTE — Telephone Encounter (Signed)
PT called requesting medication refill on lisinopril (PRINIVIL,ZESTRIL) 10 MG tablet . Please f/up

## 2016-05-05 MED FILL — ?LISINOPRIL 10 MG TABLET: 10 | 30 days supply | Qty: 30 | Fill #0

## 2016-05-05 NOTE — Telephone Encounter (Signed)
Lisinopril refilled x 30 days - patient must have office visit for further refills

## 2016-05-30 MED FILL — ?SIMVASTATIN 20 MG TABLET: 20 MG | 30 days supply | Qty: 30 | Fill #6

## 2016-07-06 ENCOUNTER — Telehealth: Payer: Self-pay | Admitting: Internal Medicine

## 2016-07-06 MED ORDER — LISINOPRIL 10 MG PO TABS: 10.0000 mg | ORAL_TABLET | Freq: Every day | ORAL | 0 refills | Status: DC

## 2016-07-06 NOTE — Telephone Encounter (Signed)
Pt called requesting medication refill on lisinopril (PRINIVIL,ZESTRIL) 10 MG tablet  Pt was informed that he needs an ov for further refills but states he is completely out , scheduled appt for 09/14 but is concerned to be without meds. Please f/up

## 2016-07-06 NOTE — Telephone Encounter (Signed)
Medication refilled x 15 days in order to last him until his appt.

## 2016-07-08 ENCOUNTER — Telehealth: Payer: Self-pay | Admitting: Internal Medicine

## 2016-07-08 MED ORDER — LISINOPRIL 10 MG PO TABS: 10.0000 mg | ORAL_TABLET | Freq: Every day | ORAL | 0 refills | Status: DC

## 2016-07-08 NOTE — Telephone Encounter (Signed)
Pt. Called requesting for his Lisinopril to be changed to [place] [address].  Please f/u

## 2016-07-08 NOTE — Telephone Encounter (Signed)
Refilled lisinopril x 15 days but patient must have office visit for any further refills.

## 2016-07-14 ENCOUNTER — Ambulatory Visit: Payer: Self-pay | Admitting: Internal Medicine

## 2016-07-28 ENCOUNTER — Encounter: Payer: Self-pay | Admitting: Internal Medicine

## 2016-07-28 ENCOUNTER — Ambulatory Visit: Payer: Self-pay | Attending: Internal Medicine | Admitting: Internal Medicine

## 2016-07-28 VITALS — BP 149/79 | HR 79 | Temp 98.2°F | Resp 16 | Ht 65.0 in | Wt 168.0 lb

## 2016-07-28 DIAGNOSIS — E785 Hyperlipidemia, unspecified: Secondary | ICD-10-CM | POA: Insufficient documentation

## 2016-07-28 DIAGNOSIS — Z23 Encounter for immunization: Secondary | ICD-10-CM

## 2016-07-28 DIAGNOSIS — Z79899 Other long term (current) drug therapy: Secondary | ICD-10-CM | POA: Insufficient documentation

## 2016-07-28 DIAGNOSIS — D171 Benign lipomatous neoplasm of skin and subcutaneous tissue of trunk: Secondary | ICD-10-CM | POA: Insufficient documentation

## 2016-07-28 DIAGNOSIS — Z125 Encounter for screening for malignant neoplasm of prostate: Secondary | ICD-10-CM | POA: Insufficient documentation

## 2016-07-28 DIAGNOSIS — I10 Essential (primary) hypertension: Secondary | ICD-10-CM | POA: Insufficient documentation

## 2016-07-28 DIAGNOSIS — Z114 Encounter for screening for human immunodeficiency virus [HIV]: Secondary | ICD-10-CM | POA: Insufficient documentation

## 2016-07-28 LAB — BASIC METABOLIC PANEL WITH GFR
BUN: 17 mg/dL (ref 7–25)
CALCIUM: 9.5 mg/dL (ref 8.6–10.3)
CHLORIDE: 104 mmol/L (ref 98–110)
CO2: 24 mmol/L (ref 20–31)
CREATININE: 0.88 mg/dL (ref 0.60–1.35)
GFR, Est Non African American: >89 mL/min (ref >=60)
Glucose, Bld: 96 mg/dL (ref 65–99)
Potassium: 4.2 mmol/L (ref 3.5–5.3)
Sodium: 140 mmol/L (ref 135–146)

## 2016-07-28 LAB — CBC WITH DIFFERENTIAL/PLATELET
Basophils Absolute: 62 cells/uL (ref 0–200)
Basophils Relative: 1 %
Eosinophils Absolute: 124 cells/uL (ref 15–500)
Eosinophils Relative: 2 %
HEMATOCRIT: 47.6 % (ref 38.5–50.0)
Hemoglobin: 17 g/dL (ref 13.2–17.1)
LYMPHS ABS: 2356 {cells}/uL (ref 850–3900)
Lymphocytes Relative: 38 %
MCH: 33.3 pg — ABNORMAL HIGH (ref 27.0–33.0)
MCHC: 35.7 g/dL (ref 32.0–36.0)
MCV: 93.3 fL (ref 80.0–100.0)
MONOS PCT: 9 %
MPV: 10.4 fL (ref 7.5–12.5)
Monocytes Absolute: 558 cells/uL (ref 200–950)
Neutro Abs: 3100 cells/uL (ref 1500–7800)
Neutrophils Relative %: 50 %
PLATELETS: 282 10*3/uL (ref 140–400)
RBC: 5.1 MIL/uL (ref 4.20–5.80)
RDW: 13.2 % (ref 11.0–15.0)
WBC: 6.2 10*3/uL (ref 3.8–10.8)

## 2016-07-28 LAB — PSA: PSA: 0.3 ng/mL (ref <=4.0)

## 2016-07-28 MED ORDER — SIMVASTATIN 20 MG PO TABS: 20.0000 mg | ORAL_TABLET | Freq: Every day | ORAL | 3 refills | Status: DC

## 2016-07-28 MED ORDER — LISINOPRIL 10 MG PO TABS: 10.0000 mg | ORAL_TABLET | Freq: Every day | ORAL | 3 refills | Status: DC

## 2016-07-28 MED FILL — LISINOPRIL 10 MG TABLET: 10 | 30 days supply | Qty: 30 | Fill #0

## 2016-07-28 MED FILL — SIMVASTATIN 20 MG TABLET: 20 | 30 days supply | Qty: 30 | Fill #0

## 2016-07-28 NOTE — Patient Instructions (Addendum)
Financial aid to renew [employer], etc.   Plan de alimentacin DASH (DASH Eating Plan) DASH es la sigla en ingls de "Enfoques Alimentarios para Detener la Hipertensin". El plan de alimentacin DASH ha demostrado bajar la presin arterial elevada (hipertensin). Los beneficios adicionales para la salud pueden incluir la disminucin del riesgo de diabetes mellitus tipo2, enfermedades cardacas e ictus. Este plan tambin puede ayudar a Horticulturist, commercial. QU DEBO SABER ACERCA DEL PLAN DE ALIMENTACIN DASH? Para el plan de alimentacin DASH, seguir las siguientes pautas generales:  Elija los alimentos con un valor porcentual diario de sodio de menos del 5% (segn figura en la etiqueta del alimento).  Use hierbas o aderezos sin sal, en lugar de sal de mesa o sal marina.  Consulte al mdico o farmacutico antes de usar sustitutos de la sal.  Coma productos con bajo contenido de sodio, cuya etiqueta suele decir "bajo contenido de sodio" o "sin agregado de sal".  Coma alimentos frescos.  Coma ms verduras, frutas y productos lcteos con bajo contenido de [place].  Elija los cereales integrales. Busque la palabra "integral" en Equities trader de la lista de ingredientes.  Elija el pescado y el pollo o el pavo sin piel ms a menudo que las carnes rojas. Limite el consumo de pescado, carne de ave y carne a 6onzas (170g) por Training and development officer.  Limite el consumo de dulces, postres, azcares y bebidas azucaradas.  Elija las grasas saludables para el corazn.  Limite el consumo de queso a 1onza (28g) por Training and development officer.  Consuma ms comida casera y menos de restaurante, de buf y comida rpida.  Limite el consumo de alimentos fritos.  Cocine los alimentos utilizando mtodos que no sean la fritura.  Limite las verduras enlatadas. Si las consume, enjuguelas bien para disminuir el sodio.  Cuando coma en un restaurante, pida que preparen su comida con menos sal o, en lo posible, sin nada de sal. QU ALIMENTOS PUEDO  COMER? Pida ayuda a un nutricionista para conocer las necesidades calricas individuales. Cereales Pan de salvado o integral. Arroz integral. Pastas de salvado o integrales. Quinua, trigo burgol y cereales integrales. Cereales con bajo contenido de sodio. Tortillas de harina de maz o de salvado. Pan de maz integral. Galletas saladas integrales. Galletas con bajo contenido de [place]. Vegetales Verduras frescas o congeladas (crudas, al vapor, asadas o grilladas). Jugos de tomate y verduras con contenido bajo o reducido de sodio. Pasta y salsa de tomate con contenido bajo o [place]. Verduras enlatadas con bajo contenido de sodio o reducido de sodio.  Lambert Mody Lambert Mody frescas, en conserva (en su jugo natural) o frutas congeladas. Carnes y otros productos con protenas Carne de res molida (al 85% o ms [place]), carne de res de animales alimentados con pastos o carne de res sin la grasa. Pollo o pavo sin piel. Carne de pollo o de [place]. Cerdo sin la grasa. Todos los pescados y frutos de mar. Huevos. Porotos, guisantes o lentejas secos. Frutos secos y semillas sin sal. Frijoles enlatados sin sal. Lcteos Productos lcteos con bajo contenido de grasas, como [place] o al 1%, quesos reducidos en grasas o al 2%, ricota con bajo contenido de grasas o [employer], o yogur natural con bajo contenido de [place]. Quesos con contenido bajo o reducido de sodio. Grasas y Naval architect en barra que no contengan grasas trans. [place] y alios para ensaladas livianos o reducidos en grasas (reducidos en sodio). [place]. Aceites de crtamo, oliva o canola. [place] natural de man o  almendra. Otros Palomitas de maz y pretzels sin sal. Los artculos mencionados arriba pueden no ser [employer] Foods Company de las bebidas o los alimentos recomendados. Comunquese con el nutricionista para conocer ms opciones. QU ALIMENTOS NO SE RECOMIENDAN? Cereales Pan blanco. Pastas blancas. Arroz  blanco. Pan de maz refinado. Bagels y croissants. Galletas saladas que contengan grasas trans. Vegetales Vegetales con crema o fritos. Verduras en [place]. Verduras enlatadas comunes. Pasta y salsa de tomate en lata comunes. Jugos comunes de tomate y de verduras. Lambert Mody Frutas secas. Fruta enlatada en almbar liviano o espeso. Jugo de frutas. Carnes y otros productos con protenas [place] de carne con Lobbyist. Costillas, alas de pollo, tocineta, salchicha, mortadela, salame, chinchulines, tocino, perros calientes, salchichas alemanas y embutidos envasados. Frutos secos y semillas con sal. Frijoles con sal en lata. Lcteos Leche entera o al 2%, crema, mezcla de [place] y crema, y queso crema. Yogur entero o endulzado. Quesos o queso azul con alto contenido de Physicist, medical. [place] no lcteas y coberturas batidas. Quesos procesados, quesos para untar o cuajadas. Condimentos Sal de cebolla y ajo, sal condimentada, sal de mesa y sal marina. Salsas en lata y envasadas. Salsa [place]. Salsa trtara. Salsa barbacoa. Salsa teriyaki. Salsa de soja, incluso la que tiene contenido reducido de [place]. Salsa de carne. Salsa de pescado. Salsa de [place]. Salsa rosada. Rbano picante. Ketchup y mostaza. Saborizantes y tiernizantes para carne. Caldo en cubitos. Salsa picante. Salsa tabasco. Adobos. Aderezos para tacos. Salsas. Grasas y aceites [place], [place] en barra, [place] de [place], [place], [place] clarificada y Wendee Copp de tocino. Aceites de coco, de palmiste o de palma. Aderezos comunes para ensalada. Otros Pickles y [place]. Palomitas de maz y pretzels con sal. Los artculos mencionados arriba pueden no ser [employer] Foods Company de las bebidas y los alimentos que se Higher education careers adviser. Comunquese con el nutricionista para obtener ms informacin. DNDE Dolan Amen MS INFORMACIN? [place], del Pulmn y de Herbalist (National Heart, Lung, and [place]):  travelstabloid.com   Esta informacin no tiene Marine scientist el consejo del mdico. Asegrese de hacerle al mdico cualquier pregunta que tenga.   Document Released: 10/06/2011 Document Revised: 11/07/2014 Elsevier Interactive Patient Education 2016 [place] (Health Maintenance, Male) Un estilo de vida saludable y los cuidados preventivos pueden favorecer la salud y [place].  No deje de Terex Corporation de rutina de la salud, dentales y de Public librarian.  Consuma una dieta saludable. Los CBS Corporation, frutas, cereales integrales, productos lcteos descremados y protenas magras contienen los nutrientes que usted necesita y no tienen muchas caloras. Disminuya la ingesta de alimentos ricos en grasas slidas, azcares y sal agregadas. Si es necesario, pdale informacin acerca de una dieta [place] a su mdico.  Realizar actividad fsica de forma regular es una de las prcticas ms importantes que puede hacer por su salud. Los adultos deben hacer al menos 150 minutos de ejercicios de intensidad moderada (cualquier actividad que aumente la frecuencia cardaca y lo haga transpirar) cada semana. Adems, la [employer] de los adultos necesita practicar ejercicios de fortalecimiento muscular dos o ms veces por semana.  Mantenga un peso saludable. El ndice de masa corporal [facility]) es una herramienta que identifica posibles problemas con [place]. Proporciona una estimacin de la grasa corporal basndose en el peso y la altura. El mdico podr determinar su [facility] y ayudarlo a Scientist, forensic o Theatre manager un peso saludable. Para los adultos [place] de [place]:  Un [facility] menor de 18,5 se considera bajo peso.  Un [facility] entre 18,5 y 24,9 es normal.  Un [facility] entre 25 y 29,9 se considera sobrepeso.  Un IMC de 30 o ms se considera obesidad.  Mantenga un nivel normal de lpidos y colesterol en la sangre practicando actividad  fsica y minimizando la ingesta de grasas saturadas. Consuma una dieta balanceada e incluya variedad de frutas y vegetales. A partir de los 20 aos se deben realizar anlisis de sangre a fin de Freight forwarder nivel de lpidos y colesterol en la sangre y [place] cada 5 aos. Si los niveles de colesterol son altos, tiene ms de 50 aos o tiene riesgo elevado de sufrir enfermedades cardacas, Designer, industrial/product controlarse con ms frecuencia. Si tiene [employer]a de lpidos y colesterol, debe recibir tratamiento con medicamentos, si la dieta y el ejercicio no estn funcionando.  Si fuma, consulte con el mdico acerca de las opciones para dejar de [place]. Si no fuma, no comience.  Se recomienda realizar exmenes de deteccin de cncer de pulmn a personas adultas entre 57 y 57 aos que estn en riesgo de Horticulturist, commercial de pulmn por sus antecedentes de consumo de tabaco. Para quienes hayan fumado durante 30 aos un paquete diario, y sigan fumando o hayan dejado el hbito en algn momento en los ltimos 15 aos, se recomienda realizarse una tomografa computada de baja dosis de los pulmones todos los [place]. Fumar un paquete por ao equivale a fumar un promedio de un paquete de cigarrillos diario durante un ao (por ejemplo, fumar 30paquetes por ao podra significar fumar un paquete de cigarrillos diario durante 30aos o 2paquetes diarios durante 15aos). Los exmenes anuales deben continuar hasta que el fumador haya dejado de fumar durante un mnimo de 15 aos. Ya no deben Emergency planning/management officer que tengan un problema de salud que les impida recibir tratamiento para el cncer de pulmn.  Si decide tomar alcohol, no beba ms de [employer]. Se considera una medida 12onzas (34m) de cerveza, 5onzas (1510m de vino o 1,4,[id number] licor.  Evite el consumo de drogas. No comparta agujas. Pida ayuda si necesita asistencia o instrucciones con respecto a abandonar el consumo de drogas.  La  hipertensin arterial causa enfermedades cardacas y auSerbial riesgo de ictus. La hipertensin arterial es ms probable en los siguientes casos:  Las personas que tienen la presin arterial en el extremo del rango normal (100-139/85-89 mm Hg).  Las personas con sobrepeso u obesidad.  Las peRetail banker Si usted tiene entre 18 y 39 aos, debe medirse la presin arterial cada 3 a 5 aos. Si usted tiene 40 aos o ms, debe medirse la presin arterial toHewlett-PackardDebe medirse la presin arterial dos veces: una vez cuando est en un hospital o una clnica y la otra vez cuando est en otro sitio. Registre el promedio de laFederated Department StoresPara controlar su presin arterial cuando no est en un hospital o unGrace Isaacpuede usar lo siguiente:  UnArdelia Memsquina automtica para medir la presin arterial en una farmacia.  Un monitor para medir la presin arterial en el hogar.  Si tiene entre [id number], consulte a su mdico si debe tomar aspirina para prevenir enfermedades cardacas.  Los anlisis para la diabetes incluyen la toma de unTanzaniae sangre para controlar el nivel de azcar en la sangre durante el ayNew BuffaloDebe hacerlos cada 3aos despus de los 45aos si su peso es normal y  no tiene factores de riesgo de diabetes. Las pruebas deben comenzar a edades tempranas o llevarse a cabo con ms frecuencia si tiene sobrepeso y al menos un factor de riesgo para la diabetes.  El cncer colorrectal puede detectarse y con frecuencia puede prevenirse. La mayor parte de los estudios de rutina se deben Medical laboratory scientific officer a Field seismologist a Proofreader de los 23 aos y Lavina 15 aos. Sin embargo, el mdico podr aconsejarle que lo haga antes, si tiene factores de riesgo para el cncer de colon. Una vez por ao, el mdico le dar un kit de prueba para Hydrologist en la materia fecal. Es posible que se use una pequea cmara en el extremo de un tubo para examinar directamente el colon (sigmoidoscopa o  colonoscopa) para Hydrographic surveyor formas tempranas de cncer colorrectal. Hable sobre esto con su mdico si tiene 93 aos, edad a la que [employer] estudios de [place]. El examen directo del colon debe repetirse cada cinco a 10 aos, hasta los 75 aos, excepto que se encuentren formas tempranas de plipos precancerosos o pequeos bultos.  Las personas con un riesgo mayor de Insurance risk surveyor hepatitis B deben realizarse anlisis para Futures trader virus. Se considera que tiene un alto riesgo de Museum/gallery curator hepatitis B si:  Naci en un pas donde la hepatitis B es frecuente. Pregntele a su mdico qu pases son considerados de Public affairs consultant.  Sus padres nacieron en un pas de alto riesgo y usted no recibi una vacuna que lo proteja contra la hepatitis B (vacuna contra la hepatitis B).  [place].  [place] agujas para inyectarse drogas.  Vive o tiene sexo con alguien que tiene hepatitis B.  Es un hombre que tiene sexo con otros hombres.  Recibe tratamiento de hemodilisis.  Toma ciertos medicamentos para Nurse, mental health, trasplante de rganos y afecciones autoinmunes.  Se recomienda realizar un anlisis de sangre para Hydrographic surveyor hepatitis C a todas las personas nacidas entre 1945 y 1965, y a toda persona que tenga un riesgo de haber contrado esta enfermedad.  Los hombres sanos no deben hacerse anlisis de sangre para Hydrographic surveyor antgenos especficos prostticos (PSA) como parte de los estudios de rutina para Science writer. Pregntele a su mdico sobre las pruebas de deteccin de cncer de prstata.  La evaluacin del cncer de testculos no se recomienda en hombres adolescentes ni adultos que no tengan sntomas. La evaluacin incluye el autoexamen, el examen por parte del mdico y otras pruebas diagnsticas. Consulte con su mdico si tiene algn sntoma o preocupaciones acerca del cncer de testculos.  Practique el sexo seguro. Use condones y evite las prcticas sexuales riesgosas para disminuir el  contagio de enfermedades de transmisin sexual (ETS).  Debe realizarse pruebas de deteccin de ETS, incluidas la gonorrea y la clamidia si:  Es sexualmente activa y es menor de [place].  Es mayor de 63aos y Investment banker, operational dice que est en riesgo de padecer esta infeccin.  La actividad sexual ha cambiado desde que le hicieron la ltima prueba de deteccin y tiene un riesgo mayor de Best boy clamidia o Radio broadcast assistant. Pregntele al mdico si usted tiene riesgo.  Si tiene riesgo de infectarse por el VIH, se recomienda tomar diariamente un medicamento recetado para evitar la infeccin. Esto se conoce como profilaxis previa a la exposicin. Se considera que est en riesgo si:  Es un hombre que tiene sexo con otros hombres.  Es heterosexual y es activo sexualmente con mltiples parejas.  Se inyecta drogas.  Es sexualmente  activo con una pareja que tiene VIH.  Consulte a su mdico para saber si tiene un alto riesgo de infectarse por el VIH. Si opta por comenzar la profilaxis previa a la exposicin, primero debe realizarse anlisis de deteccin del VIH. Luego, le harn anlisis cada 61mses mientras est tomando los medicamentos para la profilaxis previa a la exposicin.  Utilice pantalla solar. Aplique pantalla solar de mKerry Doryy repetida a lo largo del dTraining and development officer Resgurdese del sol cuando la sombra sea ms pequea que usted. Protjase usando mangas y pThe ServiceMaster Company un sombrero de ala ancha y gafas para el sol todo el ao, siempre que se encuentre en el exterior.  Informe a su mdico si aparecen nuevos lunares o los que tiene se modifican, especialmente en forma y color. Tambin notifique al mdico si un lunar es ms grande que el tamao de una goma de lGames developer  Si tiene entre [phone] [phone] y es o ha sido fumador, se recomienda un estudio con ecografa para dEnvironmental managerde aorta abdominal (AAA) y su eventual reparacin qUnited [place]  MStiles(inmunizaciones).   Esta  informacin no tiene cMarine scientistel consejo del mdico. Asegrese de hacerle al mdico cualquier pregunta que tenga.   Document Released: 04/14/2008 Document Revised: 11/07/2014 Elsevier Interactive Patient Education 2016 EReynolds American  Hipertensin (Hypertension) El trmino hipertensin es otra forma de denominar a la presin arterial elevada. La presin arterial elevada fuerza al corazn a trabajar ms para bombear la sangre. Una lectura de la presin arterial consta de dos nmeros: uno ms alto sobre uno ms bajo (por ejemplo, 110/72). CUIDADOS EN EL HOGAR   Haga que el mdico le tome nuevamente la presin arterial.  Tome los medicamentos solamente como se lo haya indicado el mdico. Siga cuidadosamente las indicaciones. Los medicamentos pierden eficacia si omite dosis. El hecho de omitir las dosis tambin aSerbiael riesgo de otros problemas.  No fume.  Contrlese la presin arterial en su casa como se lo haya indicado el mdico. SOLICITE AYUDA SI:  Piensa que tiene una reaccin a los medicamentos que est tomando.  Tiene mareos o dolores de cabeza reiterados.  Se le inflaman (hinchan) los tobillos.  Tiene problemas de visin. SOLICITE AYUDA DE INMEDIATO SI:   Tiene un dolor de cabeza muy intenso y est confundido.  Se siente dbil, aturdido o se desmaya.  Tiene dolor en el pecho o el estmago (abdominal).  Tiene vmitos.  No puede respirar mLiberty Media ASEGRESE DE QUE:   Comprende estas instrucciones.  Controlar su afeccin.  Recibir ayuda de inmediato si no mejora o si empeora.   Esta informacin no tiene cMarine scientistel consejo del mdico. Asegrese de hacerle al mdico cualquier pregunta que tenga.   Document Released: 04/06/2010 Document Revised: 10/22/2013 Elsevier Interactive Patient Education 2[employer]ation[employer]

## 2016-07-28 NOTE — Addendum Note (Signed)
Addended by: Betti Cruz on: 07/28/2016 12:09 PM   Modules accepted: Orders

## 2016-07-28 NOTE — Progress Notes (Signed)
Shawn Porter, is a 45 y.o. male  [id number]  [id number]  DOB - 07/17/1971  CC: No chief complaint on file.      HPI: Shawn Porter is a 45 y.o. male here today to establish medical care, w/ phmx of htn/hld.  Does not smoke, occasionally etoh. No ivdu. Pleasant, not c/o.  Hx of 3 back lipoma surgical resections about 1 year ago, and right inguinal repair as well.    Exercises.  Not watching salt  Intake as much.  Patient has No headache, No chest pain, No abdominal pain - No Nausea, No new weakness tingling or numbness, No Cough - SOB.    Review of Systems: Per hPI, o/w all systems reviewed and negative.    No Known Allergies Past Medical History:  Diagnosis Date  . Hyperlipidemia   . Hypertension    Current Outpatient Prescriptions on File Prior to Visit  Medication Sig Dispense Refill  . acetaminophen-codeine (TYLENOL #3) 300-30 MG per tablet Take 1 tablet by mouth every 4 (four) hours as needed. (Patient not taking: Reported on 10/01/2015) 60 tablet 0  . carbamazepine (TEGRETOL) 200 MG tablet Take 1 tablet (200 mg total) by mouth daily. (Patient not taking: Reported on 10/27/2014) 30 tablet 6  . cyclobenzaprine (FLEXERIL) 10 MG tablet Take 1 tablet (10 mg total) by mouth at bedtime as needed for muscle spasms. (Patient not taking: Reported on 02/24/2015) 30 tablet 0  . diclofenac sodium (VOLTAREN) 1 % GEL Apply 2 g topically 4 (four) times daily. (Patient not taking: Reported on 10/01/2015) 1 Tube 1  . docusate sodium (COLACE) 100 MG capsule Take 100 mg by mouth.    Marland Kitchen HYDROcodone-acetaminophen (NORCO/VICODIN) 5-325 MG per tablet Take 1 tablet by mouth.    Marland Kitchen ibuprofen (ADVIL,MOTRIN) 200 MG tablet Take 200-400 mg by mouth daily as needed (pain).    Marland Kitchen lisinopril (PRINIVIL,ZESTRIL) 10 MG tablet Take 1 tablet (10 mg total) by mouth daily. 15 tablet 0  . nitroGLYCERIN (NITRODUR - DOSED IN MG/24 HR) 0.2 mg/hr patch Place 1/4 to 1/2 of patch over affected region.  Remove and replace once daily.  Slightly alter skin placement daily (Patient not taking: Reported on 10/01/2015) 30 patch 1  . simvastatin (ZOCOR) 20 MG tablet Take 1 tablet (20 mg total) by mouth at bedtime. 90 tablet 3  . traMADol (ULTRAM) 50 MG tablet Take 1 tablet (50 mg total) by mouth every 6 (six) hours as needed. 10 tablet 0   No current facility-administered medications on file prior to visit.    No family history on file. Social History   Social History  . Marital status: Married    Spouse name: N/A  . Number of children: N/A  . Years of education: N/A   Occupational History  . Not on file.   Social History Main Topics  . Smoking status: Never Smoker  . Smokeless tobacco: Not on file  . Alcohol use Yes     Comment: occasonally  . Drug use: No  . Sexual activity: Not on file   Other Topics Concern  . Not on file   Social History Narrative  . No narrative on file    Objective:  There were no vitals filed for this visit.  There were no vitals filed for this visit.  BP Readings from Last 3 Encounters:  11/16/15 128/77  11/01/15 121/85  10/01/15 117/71    Physical Exam: Constitutional: Patient appears well-developed and well-nourished. No distress. AAOx3, pleasant. HENT: Normocephalic,  atraumatic, External right and left ear normal. Oropharynx is clear and moist.  bilat tms clear. Eyes: Conjunctivae and EOM are normal. PERRL, no scleral icterus. Neck: Normal ROM. Neck supple. No JVD.  CVS: RRR, S1/S2 +, no murmurs, no gallops, no carotid bruit.  Pulmonary: Effort and breath sounds normal, no stridor, rhonchi, wheezes, rales.  Abdominal: Soft. BS +, no distension, tenderness, rebound or guarding.  Musculoskeletal: Normal range of motion. No edema and no tenderness.  LE: bilat/ no c/c/e, pulses 2+ bilateral. Neuro: Alert.  muscle tone coordination wnl. No cranial nerve deficit grossly. Skin: Skin is warm and dry. No rash noted. Not diaphoretic. No erythema. No  pallor. Psychiatric: Normal mood and affect. Behavior, judgment, thought content normal.  Lab Results  Component Value Date   WBC 6.6 08/26/2014   HGB 18.2 (H) 08/26/2014   HCT 49.9 08/26/2014   MCV 90.9 08/26/2014   PLT 282 08/26/2014   Lab Results  Component Value Date   CREATININE 0.77 10/01/2015   BUN 22 10/01/2015   NA 137 10/01/2015   K 4.0 10/01/2015   CL 102 10/01/2015   CO2 23 10/01/2015    Lab Results  Component Value Date   HGBA1C 5.20 10/01/2015   Lipid Panel     Component Value Date/Time   CHOL 226 (H) 10/01/2015 1203   TRIG 229 (H) 10/01/2015 1203   HDL 37 (L) 10/01/2015 1203   CHOLHDL 6.1 (H) 10/01/2015 1203   VLDL 46 (H) 10/01/2015 1203   LDLCALC 143 (H) 10/01/2015 1203       Depression screen [facility] 2/9 10/01/2015 04/15/2015 04/15/2015 02/24/2015 08/26/2014  Decreased Interest 0 0 0 0 0  Down, Depressed, Hopeless 0 0 0 0 0  PHQ - 2 Score 0 0 0 0 0    Assessment and plan:   1.HTN (hypertension), benign - slight elevation, ran out of acei 1 wk. - renewed lisinopril 10 qday. - DASH diet discussed - BASIC METABOLIC PANEL WITH GFR - CBC with Differential  2. hld Not fasting currently, renewed simvastatin 20 qhs Fasting lipids on next visit.  3. Encounter for screening for HIV - HIV antibody (with reflex)  4. Immunizations tdap and flu today.  5. Prostate cancer screening - PSA  Return in about 3 months (around 10/27/2016).  The patient was given clear instructions to go to ER or return to medical center if symptoms don't improve, worsen or new problems develop. The patient verbalized understanding. The patient was told to call to get lab results if they haven't heard anything in the next week.    This note has been created with Surveyor, quantity. Any transcriptional errors are unintentional.   Maren Reamer, MD, [place],  [place]   07/28/2016, 11:41 AM

## 2016-07-29 LAB — HIV ANTIBODY (ROUTINE TESTING W REFLEX): HIV: NONREACTIVE

## 2016-08-03 ENCOUNTER — Telehealth: Payer: Self-pay

## 2016-08-03 NOTE — Telephone Encounter (Signed)
[employer] [place] Id: [id number] Contacted pt to go over lab results pt is aware of results and doesn't have any questions or concerns

## 2016-09-15 MED FILL — LISINOPRIL 10 MG TABLET: 10 | 30 days supply | Qty: 30 | Fill #1

## 2016-09-15 MED FILL — SIMVASTATIN 20 MG TABLET: 20 | 30 days supply | Qty: 30 | Fill #1

## 2016-11-04 MED FILL — ?LISINOPRIL 10 MG TABLET: 10 | 30 days supply | Qty: 30 | Fill #2

## 2016-11-11 MED FILL — SIMVASTATIN 20 MG TABLET: 20 | 30 days supply | Qty: 30 | Fill #2

## 2016-11-21 ENCOUNTER — Ambulatory Visit: Payer: Self-pay | Attending: Internal Medicine

## 2016-11-21 ENCOUNTER — Telehealth: Payer: Self-pay | Admitting: Internal Medicine

## 2016-11-21 ENCOUNTER — Other Ambulatory Visit: Payer: Self-pay | Admitting: Internal Medicine

## 2016-11-21 NOTE — Telephone Encounter (Signed)
Pt. Wife called stating that since pt. Started taking lisinopril and the cholesterol medication pt. Has not been feeling well. Pt. States his heart rate rises. Please f/u with pt.

## 2016-11-22 NOTE — Telephone Encounter (Signed)
Could you schedule an appointment  

## 2016-11-22 NOTE — Telephone Encounter (Signed)
Will forward to pcp

## 2016-11-22 NOTE — Telephone Encounter (Signed)
Please have him make appt w/ [place]. Have not seen since 9/17. Thanks.

## 2016-11-23 ENCOUNTER — Encounter: Payer: Self-pay | Admitting: Internal Medicine

## 2016-11-23 ENCOUNTER — Ambulatory Visit: Payer: Self-pay | Attending: Internal Medicine | Admitting: Internal Medicine

## 2016-11-23 ENCOUNTER — Other Ambulatory Visit: Payer: Self-pay

## 2016-11-23 VITALS — BP 123/79 | HR 76 | Temp 98.4°F | Resp 18 | Ht 66.0 in | Wt 172.0 lb

## 2016-11-23 DIAGNOSIS — E785 Hyperlipidemia, unspecified: Secondary | ICD-10-CM | POA: Insufficient documentation

## 2016-11-23 DIAGNOSIS — R002 Palpitations: Secondary | ICD-10-CM | POA: Insufficient documentation

## 2016-11-23 DIAGNOSIS — H579 Unspecified disorder of eye and adnexa: Secondary | ICD-10-CM | POA: Insufficient documentation

## 2016-11-23 DIAGNOSIS — I10 Essential (primary) hypertension: Secondary | ICD-10-CM

## 2016-11-23 LAB — CBC WITH DIFFERENTIAL/PLATELET
BASOS ABS: 78 {cells}/uL (ref 0–200)
Basophils Relative: 1 %
EOS PCT: 2 %
Eosinophils Absolute: 156 cells/uL (ref 15–500)
HCT: 49 % (ref 38.5–50.0)
HEMOGLOBIN: 17.2 g/dL — AB (ref 13.2–17.1)
LYMPHS PCT: 36 %
Lymphs Abs: 2808 cells/uL (ref 850–3900)
MCH: 32.9 pg (ref 27.0–33.0)
MCHC: 35.1 g/dL (ref 32.0–36.0)
MCV: 93.7 fL (ref 80.0–100.0)
MONOS PCT: 7 %
MPV: 10.6 fL (ref 7.5–12.5)
Monocytes Absolute: 546 cells/uL (ref 200–950)
NEUTROS PCT: 54 %
Neutro Abs: 4212 cells/uL (ref 1500–7800)
PLATELETS: 275 10*3/uL (ref 140–400)
RBC: 5.23 MIL/uL (ref 4.20–5.80)
RDW: 13.1 % (ref 11.0–15.0)
WBC: 7.8 10*3/uL (ref 3.8–10.8)

## 2016-11-23 LAB — HEPATIC FUNCTION PANEL
ALBUMIN: 4.5 g/dL (ref 3.6–5.1)
ALT: 33 U/L (ref 9–46)
AST: 30 U/L (ref 10–40)
Alkaline Phosphatase: 57 U/L (ref 40–115)
BILIRUBIN TOTAL: 1.3 mg/dL — AB (ref 0.2–1.2)
Bilirubin, Direct: 0.2 mg/dL (ref <=0.2)
Indirect Bilirubin: 1.1 mg/dL (ref 0.2–1.2)
Total Protein: 7.1 g/dL (ref 6.1–8.1)

## 2016-11-23 LAB — TSH: TSH: 1.91 m[IU]/L (ref 0.40–4.50)

## 2016-11-23 MED ORDER — HYDROCHLOROTHIAZIDE 25 MG PO TABS: 25.0000 mg | ORAL_TABLET | Freq: Every day | ORAL | 3 refills | Status: DC

## 2016-11-23 MED FILL — HYDROCHLOROTHIAZIDE 25 MG T: 25 | 30 days supply | Qty: 30 | Fill #0

## 2016-11-23 NOTE — Progress Notes (Signed)
Subjective:  Patient ID: Elicio Gorga, male    DOB: 1971/09/28  Age: 46 y.o. MRN: [id number]  CC: Medication Reaction  HPI Josede Warshauer presents today with his wife for concerns of a reaction to his lisinopril. He says that one week ago he woke up from sleep with a racing heart beat. He had taken his lisinopril prior to sleep. He is unsure of what the heart rate was but denied chest pain, sob, or headache and says the racing heart resolved in a few hours. He has tried taking the lisinopril several more times and has continued to experience racing heart. His last dose was last night. Denies racing heart now. He has a blood pressure meter at home but has not used it recently. He has begun exercising recently and has had some energy drinks but does not feel this is related as the timing is off sync. He also complains that he has noticed yellowing of his sclera. He does not drink alcohol and is unsure if he has ever been checked for hepatitis. He denies abdominal pain. Denies fatigue, weight change, or fever. No IV drug use and states he has monogamous relationship with his wife.    Past Medical History:  Diagnosis Date  . Hyperlipidemia   . Hypertension    Past Surgical History:  Procedure Laterality Date  . HERNIA REPAIR     History reviewed. No pertinent family history.  Social History  Substance Use Topics  . Smoking status: Never Smoker  . Smokeless tobacco: Never Used  . Alcohol use Yes     Comment: occasonally   Outpatient Encounter Prescriptions as of 11/23/2016  Medication Sig  . simvastatin (ZOCOR) 20 MG tablet Take 1 tablet (20 mg total) by mouth at bedtime.  . [DISCONTINUED] lisinopril (PRINIVIL,ZESTRIL) 10 MG tablet Take 1 tablet (10 mg total) by mouth daily.  . cyclobenzaprine (FLEXERIL) 10 MG tablet Take 1 tablet (10 mg total) by mouth at bedtime as needed for muscle spasms. (Patient not taking: Reported on 02/24/2015)  . hydrochlorothiazide (HYDRODIURIL) 25 MG  tablet Take 1 tablet (25 mg total) by mouth daily.   No facility-administered encounter medications on file as of 11/23/2016.    ROS Review of Systems  Constitutional: Negative for activity change, appetite change, chills, fatigue, fever and unexpected weight change.  HENT: Negative.   Eyes: Negative for photophobia, pain, discharge, redness and itching.       Yellowing/discoloration of sclera is more than normal per wife  Respiratory: Negative.   Cardiovascular: Negative.   Gastrointestinal: Negative.   Endocrine: Negative.   Genitourinary: Negative.   Musculoskeletal: Negative.   Skin: Negative.   Allergic/Immunologic: Negative.   Neurological: Negative.   Psychiatric/Behavioral: Negative.     Objective:   Today's Vitals: BP 123/79 (BP Location: Right Arm, Patient Position: Sitting, Cuff Size: Normal)   Pulse 76   Temp 98.4 F (36.9 C) (Oral)   Resp 18   Ht 5\' 6"  (1.676 m)   Wt 172 lb (78 kg)   SpO2 97%   BMI 27.76 kg/m   Physical Exam  Constitutional: He is oriented to person, place, and time. He appears well-nourished. No distress.  HENT:  Head: Normocephalic and atraumatic.  Mouth/Throat: Oropharynx is clear and moist.  Eyes: Pupils are equal, round, and reactive to light.  Neck: Neck supple. No JVD present. No thyromegaly present.  Cardiovascular: Normal rate, regular rhythm and normal heart sounds.   Pulmonary/Chest: Effort normal and breath sounds normal. No  respiratory distress. He has no wheezes. He exhibits no tenderness.  Abdominal: Soft. Bowel sounds are normal. He exhibits no shifting dullness and no ascites. There is no hepatomegaly. There is no tenderness. There is no rebound and no guarding.  Musculoskeletal: Normal range of motion.  Lymphadenopathy:    He has no cervical adenopathy.  Neurological: He is alert and oriented to person, place, and time.  Skin: Skin is warm and dry. No rash noted.  Psychiatric: He has a normal mood and affect. His behavior  is normal. Thought content normal.   EKG: sinus bradycardia with inferior-posterior infarct. Changed since ekg in 2014.   Assessment & Plan:   Hypertension & Palpitations: record blood pressure weekly, keep log and bring log in to talk to pharmacist in 2 weeks. There was a change in the EKG from 2014 to today. We will schedule for an echocardiogram.  Stop lisinopril. Start hctz 25mg  by mouth in the morning.   Discoloration of eye: CBC and CMet today. We will call with the results.   We have discussed target BP range and blood pressure goal. I have advised patient to check BP regularly and to call us back or report to clinic if the numbers are consistently higher than 140/90. We discussed the importance of compliance with medical therapy and DASH diet recommended, consequences of uncontrolled hypertension discussed.  - continue current BP medications  Follow-up:  2 weeks to check the blood pressure log.   Evaluation and management procedures were performed by me with DNP Student in attendance, note written by DNP student under my supervision and collaboration. I have reviewed the note and I agree with the management and plan.   Angelica Chessman, MD, [place], [place], [place], [place] and [place], [place]   11/25/2016, 4:10 PM

## 2016-11-23 NOTE — Patient Instructions (Signed)
Plan de alimentacin DASH (DASH Eating Plan) DASH es la sigla en ingls de "Enfoques Alimentarios para Detener la Hipertensin". El plan de alimentacin DASH ha demostrado bajar la presin arterial elevada (hipertensin). Los beneficios adicionales para la salud pueden incluir la disminucin del riesgo de diabetes mellitus tipo2, enfermedades cardacas e ictus. Este plan tambin puede ayudar a adelgazar. QU DEBO SABER ACERCA DEL PLAN DE ALIMENTACIN DASH? Para el plan de alimentacin DASH, seguir las siguientes pautas generales:  Elija los alimentos que contienen menos de 150 miligramos de sodio por porcin (segn se indica en la etiqueta de los alimentos).  Use hierbas o aderezos sin sal, en lugar de sal de mesa o sal marina.  Consulte al mdico o farmacutico antes de usar sustitutos de la sal.  Consuma los productos con menor contenido de sodio. Estos productos suelen estar etiquetados como "bajo en sodio" o "sin agregado de sal".  Coma alimentos frescos. No consuma una gran cantidad de alimentos enlatados.  Coma ms verduras, frutas y productos lcteos con bajo contenido de grasas.  Elija los cereales integrales. Busque la palabra "integral" en el primer lugar de la lista de ingredientes.  Elija el pescado y el pollo o el pavo sin piel ms a menudo que las carnes rojas. Limite el consumo de pescado, carne de ave y carne a 6onzas (170g) por da.  Limite el consumo de dulces, postres, azcares y bebidas azucaradas.  Elija las grasas saludables para el corazn.  Consuma ms comida casera y menos de restaurante, de buf y comida rpida.  Limite el consumo de alimentos fritos.  No fra los alimentos. A la hora de cocinarlos, opte por hornearlos, hervirlos, grillarlos y asarlos a la parrilla.  Cuando coma en un restaurante, pida que preparen su comida con menos sal o, en lo posible, sin nada de sal. QU ALIMENTOS PUEDO COMER? Pida ayuda a un nutricionista para conocer las  necesidades calricas individuales. Cereales Pan de salvado o integral. Arroz integral. Pastas de salvado o integrales. Quinua, trigo burgol y cereales integrales. Cereales con bajo contenido de sodio. Tortillas de harina de maz o de salvado. Pan de maz integral. Galletas saladas integrales. Galletas con bajo contenido de sodio. Vegetales Verduras frescas o congeladas (crudas, al vapor, asadas o grilladas). Jugos de tomate y verduras con contenido bajo o reducido de sodio. Pasta y salsa de tomate con contenido bajo o reducido de sodio. Verduras enlatadas con bajo contenido de sodio o reducido de sodio. Frutas Frutas frescas, en conserva (en su jugo natural) o frutas congeladas. Carnes y otros productos con protenas Carne de res molida (al 85% o ms magra), carne de res de animales alimentados con pastos o carne de res sin la grasa. Pollo o pavo sin piel. Carne de pollo o de pavo molida. Cerdo sin la grasa. Todos los pescados y frutos de mar. Huevos. Porotos, guisantes o lentejas secos. Frutos secos y semillas sin sal. Frijoles enlatados sin sal. Lcteos Productos lcteos con bajo contenido de grasas, como leche descremada o al 1%, quesos reducidos en grasas o al 2%, ricota con bajo contenido de grasas o queso cottage, o yogur natural con bajo contenido de grasas. Quesos con contenido bajo o reducido de sodio. Grasas y aceites Margarin[place] en barra que no contengan grasas trans. [place] y alios para ensaladas livianos o reducidos en grasas (reducidos en sodio). [place]. Aceites de crtamo, oliva o canola. [place] natural de man o almendra. Otros Palomitas de maz y pretzels sin sal. Los artculos mencionados arriba pueden no   sal. Los artculos mencionados arriba pueden no ser [employer] de las bebidas o los alimentos recomendados. Comunquese con el nutricionista para conocer ms opciones.  QU ALIMENTOS NO SE RECOMIENDAN? Cereales  Pan blanco. Pastas blancas. Arroz blanco. Pan de maz refinado. Bagels y croissants.  Galletas saladas que contengan grasas trans. Vegetales  Vegetales con crema o fritos. Verduras en [place]. Verduras enlatadas comunes. Pasta y salsa de tomate en lata comunes. Jugos comunes de tomate y de verduras. Lambert Mody  Fruta enlatada en almbar liviano o espeso. Jugo de frutas. Carnes y otros productos con protenas  Cortes de carne con Lobbyist. Costillas, alas de pollo, tocineta, salchicha, mortadela, salame, chinchulines, tocino, perros calientes, salchichas alemanas y embutidos envasados. Frutos secos y semillas con sal. Frijoles con sal en lata. Lcteos  Leche entera o al 2%, crema, mezcla de [place] y crema, y queso crema. Yogur entero o endulzado. Quesos o queso azul con alto contenido de Physicist, medical. Cremas no lcteas y coberturas batidas. Quesos procesados, quesos para untar o cuajadas. Condimentos  Sal de cebolla y ajo, sal condimentada, sal de mesa y sal marina. Salsas en lata y envasadas. Salsa [place]. Salsa trtara. Salsa barbacoa. Salsa teriyaki. Salsa de soja, incluso la que tiene contenido reducido de [place]. Salsa de carne. Salsa de pescado. Salsa de [place]. Salsa rosada. Rbano picante. Ketchup y mostaza. Saborizantes y tiernizantes para carne. Caldo en cubitos. Salsa picante. Salsa tabasco. Adobos. Aderezos para tacos. Salsas. Grasas y aceites  [place], [place] en barra, [place] de [place], [place], [place] clarificada y Wendee Copp de tocino. Aceites de coco, de palmiste o de palma. Aderezos comunes para ensalada. Otros  Pickles y [place]. Palomitas de maz y pretzels con sal. Los artculos mencionados arriba pueden no ser [employer] de las bebidas y los alimentos que se Higher education careers adviser. Comunquese con el nutricionista para obtener ms informacin.  DNDE Dolan Amen MS INFORMACIN? [place], del Pulmn y de Herbalist (National Heart, Lung, and [place]): travelstabloid.com Esta informacin no  tiene Marine scientist el consejo del mdico. Asegrese de hacerle al mdico cualquier pregunta que tenga. Document Released: 10/06/2011 Document Revised: 02/08/2016 Document Reviewed: 08/21/2013 Elsevier Interactive Patient Education  2017 [place]. Hipertensin (Hypertension) El trmino hipertensin es otra forma de denominar a la presin arterial elevada. La presin arterial elevada fuerza al corazn a trabajar ms para bombear la sangre. Una lectura de la presin arterial consta de dos nmeros: uno ms alto sobre uno ms bajo (por ejemplo, 110/72). CUIDADOS EN EL HOGAR  Haga que el mdico le tome nuevamente la presin arterial.  Tome los medicamentos solamente como se lo haya indicado el mdico. Siga cuidadosamente las indicaciones. Los medicamentos pierden eficacia si omite dosis. El hecho de omitir las dosis tambin [place] el riesgo de otros problemas.  No fume.  Contrlese la presin arterial en su casa como se lo haya indicado el mdico. SOLICITE AYUDA SI:  Piensa que tiene una reaccin a los medicamentos que est tomando.  Tiene mareos o dolores de cabeza reiterados.  Se le inflaman (hinchan) los tobillos.  Tiene problemas de visin. SOLICITE AYUDA DE INMEDIATO SI:  Tiene un dolor de cabeza muy intenso y est confundido.  Se siente dbil, aturdido o se desmaya.  Tiene dolor en el pecho o el estmago (abdominal).  Tiene vmitos.  No puede respirar [employer]. ASEGRESE DE QUE:  Comprende estas instrucciones.  Controlar su afeccin.  Recibir ayuda de inmediato si no mejora o si empeora. Esta  informacin no tiene Marine scientist el consejo del mdico. Asegrese de hacerle al mdico cualquier pregunta que tenga. Document Released: 04/06/2010 Document Revised: 10/22/2013 Document Reviewed: 08/09/2013 Elsevier Interactive Patient Education  2017 [employer].

## 2016-11-23 NOTE — Progress Notes (Signed)
Patient is here for MED REFILL  Patient has not taken medication today. Patient has not eaten today.  Patient took the concerned medication for one week and experienced the "heart pounding" each night.

## 2016-11-24 ENCOUNTER — Telehealth: Payer: Self-pay | Admitting: *Deleted

## 2016-11-24 NOTE — Telephone Encounter (Signed)
Patient verified DOB Patient is aware of lab results being normal. Patient expressed his understanding and had no further questions at this time.

## 2016-11-24 NOTE — Telephone Encounter (Signed)
-----   Message from Tresa Garter, MD sent at 11/24/2016 10:21 AM EST ----- Please inform patient that his lab results are normal.

## 2016-12-02 ENCOUNTER — Encounter (HOSPITAL_COMMUNITY): Payer: Self-pay | Admitting: Emergency Medicine

## 2016-12-02 ENCOUNTER — Ambulatory Visit (HOSPITAL_COMMUNITY)
Admission: EM | Admit: 2016-12-02 | Discharge: 2016-12-02 | Disposition: A | Discharge status: Home / Self Care | Payer: Self-pay | Attending: Family Medicine | Admitting: Family Medicine

## 2016-12-02 DIAGNOSIS — G43A0 Cyclical vomiting, not intractable: Secondary | ICD-10-CM

## 2016-12-02 DIAGNOSIS — R1010 Upper abdominal pain, unspecified: Secondary | ICD-10-CM

## 2016-12-02 DIAGNOSIS — R1115 Cyclical vomiting syndrome unrelated to migraine: Secondary | ICD-10-CM

## 2016-12-02 DIAGNOSIS — R197 Diarrhea, unspecified: Secondary | ICD-10-CM

## 2016-12-02 MED ORDER — ONDANSETRON 4 MG PO TBDP: 4.0000 mg | ORAL_TABLET | Freq: Three times a day (TID) | ORAL | 0 refills | Status: DC | PRN

## 2016-12-02 MED ORDER — ONDANSETRON 4 MG PO TBDP
ORAL_TABLET | ORAL | Status: AC
  Filled 2016-12-02: qty 1

## 2016-12-02 MED ORDER — CIPROFLOXACIN HCL 500 MG PO TABS: 500.0000 mg | ORAL_TABLET | Freq: Two times a day (BID) | ORAL | 0 refills | Status: DC

## 2016-12-02 MED ORDER — ONDANSETRON 4 MG PO TBDP
4.0000 mg | ORAL_TABLET | Freq: Once | ORAL | Status: AC
  Administered 2016-12-02: 4 mg via ORAL

## 2016-12-02 MED ORDER — HYOSCYAMINE SULFATE 0.125 MG PO TABS: 0.1250 mg | ORAL_TABLET | ORAL | 0 refills | Status: DC | PRN

## 2016-12-02 MED FILL — ?CIPROFLOXACIN HCL 500MG TA: 500 | 3 days supply | Qty: 6 | Fill #0

## 2016-12-02 MED FILL — ONDANSETRON ODT 4 MG TABLET: 4 | 6 days supply | Qty: 20 | Fill #0

## 2016-12-02 NOTE — ED Notes (Signed)
Pt stopped in and said the rx was sent to the wrong pharmacy so I called it into the correct pharmacy. [facility] and left all 3 rx on the vm since they were closed for lunch.

## 2016-12-02 NOTE — ED Triage Notes (Addendum)
Pt c/o abd pain onset: yest after eating at a restaurant.   Sx include: HA, BA, abd pain, diarrhea, weakness  Denies: fevers  A&O x4... NAD

## 2016-12-02 NOTE — ED Provider Notes (Signed)
CSN: [id number]     Arrival date & time 12/02/16  1003 History   First MD Initiated Contact with Patient 12/02/16 1027     Chief Complaint  Patient presents with  . Abdominal Pain   (Consider location/radiation/quality/duration/timing/severity/associated sxs/prior Treatment) Patient c/o abdominal pain and cramps since last night.  Patient c/o nausea, vomiting, and diarrhea.  Patient states he and his family ate at a restaurant in the am and ate some eggs and now the entire family is sick with NVD.  He states he is having diarrhea and some fever.     The history is provided by the patient.  Diarrhea  Quality:  Watery Severity:  Moderate Onset quality:  Sudden Number of episodes:  4 Duration:  1 day Timing:  Intermittent Progression:  Unchanged Relieved by:  Nothing Worsened by:  Nothing Ineffective treatments:  None tried Associated symptoms: chills, fever, myalgias and vomiting   Risk factors: suspect food intake     Past Medical History:  Diagnosis Date  . Hyperlipidemia   . Hypertension    Past Surgical History:  Procedure Laterality Date  . HERNIA REPAIR     History reviewed. No pertinent family history. Social History  Substance Use Topics  . Smoking status: Never Smoker  . Smokeless tobacco: Never Used  . Alcohol use Yes     Comment: occasonally    Review of Systems  Constitutional: Positive for chills and fever.  HENT: Negative.   Eyes: Negative.   Respiratory: Negative.   Cardiovascular: Negative.   Gastrointestinal: Positive for diarrhea and vomiting.  Endocrine: Negative.   Genitourinary: Negative.   Musculoskeletal: Positive for myalgias.  Skin: Negative.   Allergic/Immunologic: Negative.   Neurological: Negative.   Hematological: Negative.   Psychiatric/Behavioral: Negative.     Allergies  Patient has no known allergies.  Home Medications   Prior to Admission medications   Medication Sig Start Date End Date Taking? Authorizing Provider   hydrochlorothiazide (HYDRODIURIL) 25 MG tablet Take 1 tablet (25 mg total) by mouth daily. 11/23/16  Yes Tresa Garter, MD  simvastatin (ZOCOR) 20 MG tablet Take 1 tablet (20 mg total) by mouth at bedtime. 07/28/16  Yes Maren Reamer, MD  ciprofloxacin (CIPRO) 500 MG tablet Take 1 tablet (500 mg total) by mouth 2 (two) times daily. 12/02/16   Lysbeth Penner, FNP  cyclobenzaprine (FLEXERIL) 10 MG tablet Take 1 tablet (10 mg total) by mouth at bedtime as needed for muscle spasms. Patient not taking: Reported on 02/24/2015 12/04/13   Hennie Duos, MD  hyoscyamine (LEVSIN) 0.125 MG tablet Take 1 tablet (0.125 mg total) by mouth every 4 (four) hours as needed. 12/02/16   Lysbeth Penner, FNP  ondansetron (ZOFRAN ODT) 4 MG disintegrating tablet Take 1 tablet (4 mg total) by mouth every 8 (eight) hours as needed for nausea or vomiting. 12/02/16   Lysbeth Penner, FNP   Meds Ordered and Administered this Visit   Medications  ondansetron (ZOFRAN-ODT) disintegrating tablet 4 mg (4 mg Oral Given 12/02/16 1040)    BP 147/81 (BP Location: Left Arm)   Pulse 86   Temp 98.7 F (37.1 C) (Oral)   Resp 18   SpO2 99%  No data found.   Physical Exam  Constitutional: He appears well-developed and well-nourished.  HENT:  Head: Normocephalic and atraumatic.  Right Ear: External ear normal.  Left Ear: External ear normal.  Mouth/Throat: Oropharynx is clear and moist.  Eyes: Conjunctivae and EOM are normal. Pupils are  equal, round, and reactive to light.  Neck: Normal range of motion. Neck supple.  Cardiovascular: Normal rate, regular rhythm and normal heart sounds.   Pulmonary/Chest: Effort normal and breath sounds normal.  Abdominal: Soft. There is tenderness.  Bowel sounds hyperactive.  Tenderness in mid abdomen and epigastric region.  Nursing note and vitals reviewed.   Urgent Care Course     Procedures (including critical care time)  Labs Review Labs Reviewed - No data to  display  Imaging Review No results found.   Visual Acuity Review  Right Eye Distance:   Left Eye Distance:   Bilateral Distance:    Right Eye Near:   Left Eye Near:    Bilateral Near:         MDM   1. Diarrhea, unspecified type   2. Non-intractable cyclical vomiting with nausea   3. Pain of upper abdomen    Levsin 0.125mg  q 4 hours prn #20 Zofran ODT 4mg  one now Zofran ODT 4mg  one pot tid prn #21 Cipro 500mg  one po bid x 3 days #6  Push po fluids, rest, tylenol and motrin otc prn as directed for fever, arthralgias, and myalgias.  Follow up prn if sx's continue or persist.   Lysbeth Penner, FNP 12/02/16 1112

## 2016-12-07 ENCOUNTER — Encounter: Payer: Self-pay | Admitting: Internal Medicine

## 2016-12-13 ENCOUNTER — Ambulatory Visit (HOSPITAL_COMMUNITY)
Admission: EM | Admit: 2016-12-13 | Discharge: 2016-12-13 | Disposition: A | Discharge status: Home / Self Care | Payer: Self-pay | Attending: Family Medicine | Admitting: Family Medicine

## 2016-12-13 ENCOUNTER — Encounter (HOSPITAL_COMMUNITY): Payer: Self-pay | Admitting: Emergency Medicine

## 2016-12-13 DIAGNOSIS — S39012A Strain of muscle, fascia and tendon of lower back, initial encounter: Secondary | ICD-10-CM

## 2016-12-13 MED ORDER — CYCLOBENZAPRINE HCL 5 MG PO TABS: 5.0000 mg | ORAL_TABLET | Freq: Three times a day (TID) | ORAL | 0 refills | Status: DC

## 2016-12-13 MED FILL — CYCLOBENZAPRINE 5 MG TABLET: 5 | 10 days supply | Qty: 30 | Fill #0

## 2016-12-13 NOTE — ED Provider Notes (Signed)
[place]    CSN: [id number] Arrival date & time: 12/13/16  [id number]     History   Chief Complaint Chief Complaint  Patient presents with  . Back Pain    [name] is a 46 y.o. male.   The history is provided by the patient.  Back Pain  Location:  Lumbar spine Radiates to:  Does not radiate Pain severity:  Mild Onset quality:  Sudden Duration:  2 hours Progression:  Unchanged Chronicity:  New Context comment:  At home and back pain with bending. Relieved by:  None tried Worsened by:  Bending Associated symptoms: no abdominal pain, no bladder incontinence, no chest pain, no fever, no leg pain, no numbness and no weakness     Past Medical History:  Diagnosis Date  . Hyperlipidemia   . Hypertension     Patient Active Problem List   Diagnosis Date Noted  . Discoloration of eye 11/23/2016  . Palpitations 11/23/2016  . Dyslipidemia 11/23/2016  . Health care maintenance 10/01/2015  . Right inguinal hernia 10/27/2014  . Hypertriglyceridemia 10/27/2014  . Pain in joint, shoulder region 10/27/2014  . Essential hypertension, benign 08/26/2014  . Dental caries 08/26/2014  . Radicular pain in right arm 10/16/2013  . 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 Seizure 07/19/2013  . Right arm pain 07/19/2013  . HTN (hypertension) 05/06/2013  . Other and unspecified hyperlipidemia 05/06/2013    Past Surgical History:  Procedure Laterality Date  . HERNIA REPAIR         Home Medications    Prior to Admission medications   Medication Sig Start Date End Date Taking? Authorizing Provider  cyclobenzaprine (FLEXERIL) 5 MG tablet Take 1 tablet (5 mg total) by mouth 3 (three) times daily. 12/13/16   Billy Fischer, MD  hyoscyamine (LEVSIN) 0.125 MG tablet Take 1 tablet (0.125 mg total) by mouth every 4 (four) hours as needed. 12/02/16   Lysbeth Penner, FNP    Family History History reviewed. No pertinent family history.  Social History Social  History  Substance Use Topics  . Smoking status: Never Smoker  . Smokeless tobacco: Never Used  . Alcohol use Yes     Comment: occasonally     Allergies   Patient has no known allergies.   Review of Systems Review of Systems  Constitutional: Negative.  Negative for fever.  Cardiovascular: Negative for chest pain.  Gastrointestinal: Negative.  Negative for abdominal pain.  Genitourinary: Negative.  Negative for bladder incontinence.  Musculoskeletal: Positive for back pain and gait problem. Negative for arthralgias, joint swelling and myalgias.  Neurological: Negative for weakness and numbness.  All other systems reviewed and are negative.    Physical Exam Triage Vital Signs ED Triage Vitals [12/13/16 1001]  Enc Vitals Group     BP 141/85     Pulse Rate 62     Resp 16     Temp 97.5 F (36.4 C)     Temp Source Oral     SpO2 100 %     Weight      Height      Head Circumference      Peak Flow      Pain Score 8     Pain Loc      Pain Edu?      Excl. in [place]?    No data found.   Updated Vital Signs BP 141/85 (BP Location: Right Arm)   Pulse 62   Temp 97.5 F (36.4  C) (Oral)   Resp 16   SpO2 100%   Visual Acuity Right Eye Distance:   Left Eye Distance:   Bilateral Distance:    Right Eye Near:   Left Eye Near:    Bilateral Near:     Physical Exam  Constitutional: He is oriented to person, place, and time. He appears well-developed and well-nourished.  Abdominal: Soft. Bowel sounds are normal.  Musculoskeletal: He exhibits tenderness.       Lumbar back: He exhibits tenderness, pain and spasm. He exhibits normal range of motion, no bony tenderness, no swelling, no edema, no deformity and normal pulse.  Neurological: He is alert and oriented to person, place, and time.  Skin: Skin is warm and dry.     UC Treatments / Results  Labs (all labs ordered are listed, but only abnormal results are displayed) Labs Reviewed - No data to display  EKG  EKG  Interpretation None       Radiology No results found.  Procedures Procedures (including critical care time)  Medications Ordered in UC Medications - No data to display   Initial Impression / Assessment and Plan / UC Course  I have reviewed the triage vital signs and the nursing notes.  Pertinent labs & imaging results that were available during my care of the patient were reviewed by me and considered in my medical decision making (see chart for details).       Final Clinical Impressions(s) / UC Diagnoses   Final diagnoses:  Strain of lumbar region, initial encounter    New Prescriptions New Prescriptions   CYCLOBENZAPRINE (FLEXERIL) 5 MG TABLET    Take 1 tablet (5 mg total) by mouth 3 (three) times daily.     Billy Fischer, MD 12/13/16 [phone]

## 2016-12-13 NOTE — ED Triage Notes (Signed)
The patient presented to the [facility] with a complaint of lower back paint that started today. The patient denied any known injury.

## 2016-12-22 MED FILL — SIMVASTATIN 20 MG TABLET: 20 | 30 days supply | Qty: 30 | Fill #3

## 2016-12-22 MED FILL — HYDROCHLOROTHIAZIDE 25 MG T: 25 | 30 days supply | Qty: 30 | Fill #1

## 2017-02-03 MED FILL — HYDROCHLOROTHIAZIDE 25 MG T: 25 | 30 days supply | Qty: 30 | Fill #2

## 2017-02-03 MED FILL — SIMVASTATIN 20 MG TABLET: 20 | 30 days supply | Qty: 30 | Fill #4

## 2017-03-22 MED FILL — SIMVASTATIN 20 MG TABLET: 20 | 30 days supply | Qty: 30 | Fill #5

## 2017-03-22 MED FILL — HYDROCHLOROTHIAZIDE 25 MG T: 25 | 30 days supply | Qty: 30 | Fill #3

## 2017-05-01 ENCOUNTER — Ambulatory Visit: Payer: Self-pay

## 2017-05-09 ENCOUNTER — Ambulatory Visit: Payer: Self-pay | Attending: Internal Medicine

## 2017-05-16 MED FILL — SIMVASTATIN 20 MG TABLET: 20 | 30 days supply | Qty: 30 | Fill #6

## 2017-06-09 ENCOUNTER — Telehealth: Payer: Self-pay | Admitting: Internal Medicine

## 2017-06-13 ENCOUNTER — Ambulatory Visit: Payer: Self-pay | Attending: Internal Medicine | Admitting: Physician Assistant

## 2017-06-13 VITALS — BP 144/94 | HR 64 | Temp 99.0°F | Resp 16 | Wt 168.6 lb

## 2017-06-13 DIAGNOSIS — I10 Essential (primary) hypertension: Secondary | ICD-10-CM | POA: Insufficient documentation

## 2017-06-13 MED ORDER — LISINOPRIL-HYDROCHLOROTHIAZIDE 20-25 MG PO TABS: 1.0000 | ORAL_TABLET | Freq: Every day | ORAL | 3 refills | Status: DC

## 2017-06-13 MED ORDER — ASPIRIN EC 81 MG PO TBEC: 81.0000 mg | DELAYED_RELEASE_TABLET | Freq: Every day | ORAL | 1 refills | Status: DC

## 2017-06-13 MED FILL — LISINOPRIL-HCTZ 20-25 MG TA: 20-25 | 30 days supply | Qty: 30 | Fill #0

## 2017-06-13 NOTE — Patient Instructions (Signed)
Check blood pressure daily and record and bring to next visit  Hipertensin Hypertension El trmino hipertensin es otra forma de denominar a la presin arterial elevada. La presin arterial elevada fuerza al corazn a trabajar ms para bombear la sangre. Esto puede causar problemas con el paso del [place]. Una lectura de presin arterial est compuesta por 2 nmeros. Hay un nmero superior (sistlico) sobre un nmero inferior (diastlico). Lo ideal es tener la presin arterial por debajo de 120/80. Las decisiones saludables pueden ayudarle a disminuir su presin arterial. Es posible que necesite medicamentos que le ayuden a disminuir su presin arterial si:  Su presin arterial no disminuye mediante decisiones saludables.  Su presin arterial est por encima de 130/80.  Siga estas instrucciones en su casa: Comida y bebida  Si se lo indican, siga el plan de alimentacin de DASH (Dietary Approaches to Stop Hypertension, Maneras de alimentarse para detener la hipertensin). Esta dieta incluye: ? Que la mitad del plato de cada comida sea de frutas y verduras. ? Que un cuarto del plato de cada comida sea de cereales integrales. Los cereales integrales incluyen pasta integral, arroz integral y pan integral. ? Comer y beber productos lcteos con bajo contenido de [place], como leche descremada o yogur bajo en grasas. ? Que un cuarto del plato de cada comida sea de protenas bajas en grasa (magras). Las protenas bajas en grasa incluyen pescado, pollo sin piel, huevos, frijoles y tofu. ? Evitar consumir carne grasa, carne curada y procesada, o pollo con piel. ? Evitar consumir alimentos prehechos o procesados.  Consuma menos de 1500 mg de sal (sodio) por da.  Limite el consumo de alcohol a no ms de 1 medida por da si es mujer y no est Music therapist y a 2 medidas por da si es hombre. Una medida equivale a 12onzas de cerveza, 5onzas de vino o 1onzas de bebidas alcohlicas de alta  graduacin. Estilo de vida  Trabaje con su mdico para mantenerse en un peso saludable o para perder peso. Pregntele a su mdico cul es el peso recomendable para usted.  Realice al menos 30 minutos de ejercicio que haga que se acelere su corazn (ejercicio Arboriculturist) la [employer] de la [place]. Estos pueden incluir caminar, nadar o andar en bicicleta.  Realice al menos 30 minutos de ejercicio que fortalezca sus msculos (ejercicios de resistencia) al menos 3 das a la [place]. Estos pueden incluir levantar pesas o hacer pilates.  No consuma ningn producto que contenga nicotina o tabaco. Esto incluye cigarrillos y cigarrillos electrnicos. Si necesita ayuda para dejar de fumar, consulte al [employer].  Controle su presin arterial en su casa tal como le indic el mdico.  Concurra a todas las visitas de control como se lo haya indicado el mdico. Esto es importante. Medicamentos  [employer] de venta libre y los recetados solamente como se lo haya indicado el mdico. Siga cuidadosamente las indicaciones.  No omita las dosis de medicamentos para la presin arterial. Los medicamentos pierden eficacia si omite dosis. El hecho de omitir las dosis tambin [place] el riesgo de otros problemas.  Pregntele a su mdico a qu efectos secundarios o reacciones a los Careers information officer. Comunquese con un mdico si:  Piensa que tiene [place] reaccin a los medicamentos que est tomando.  Tiene dolores de cabeza frecuentes (recurrentes).  Siente mareos.  Tiene hinchazn en los tobillos.  Tiene problemas de visin. Solicite ayuda de inmediato si:  Siente un dolor de cabeza muy intenso.  Comienza a sentirse confundido.  Se siente dbil o adormecido.  Siente que va a desmayarse.  Siente un dolor muy intenso en: ? El pecho. ? El vientre (abdomen).  Devuelve (vomita) ms de una vez.  Tiene dificultad para respirar. Resumen  El trmino hipertensin es otra  forma de denominar a la presin arterial elevada.  Las decisiones saludables pueden ayudarle a disminuir su presin arterial. Si no puede controlar su presin arterial mediante decisiones saludables, es posible que deba tomar medicamentos. Esta informacin no tiene Marine scientist el consejo del mdico. Asegrese de hacerle al mdico cualquier pregunta que tenga. Document Released: 04/06/2010 Document Revised: 09/28/2016 Document Reviewed: 09/28/2016 Elsevier Interactive Patient Education  [employer].

## 2017-06-13 NOTE — Progress Notes (Signed)
Patient ID: Shawn Porter, male   DOB: 04/23/1971, 46 y.o.   MRN: [id number]     Shawn Porter, is a 46 y.o. male  EKC:[id number]  [id number]:[id number]  DOB - [birth year]  Subjective:  Chief Complaint and HPI: Shawn Porter is a 46 y.o. male here today for elevated BP.  He has been taking his HCTZ daily.  He denies CP/HA/SOB/Dizziness/vision changes.  He has been checking his BP at home and it has been running in 140s/90s.  His wife is with him.  He is working out almost daily.  Drinks about 6 beers per week.  No FH early cardiac events.  Parents do not have htn.  ROS:   Constitutional:  No f/c, No night sweats, No unexplained weight loss. EENT:  No vision changes, No blurry vision, No hearing changes. No mouth, throat, or ear problems.  Respiratory: No cough, No SOB Cardiac: No CP, no palpitations GI:  No abd pain, No N/V/D. GU: No Urinary s/sx Musculoskeletal: No joint pain Neuro: No headache, no dizziness, no motor weakness.  Skin: No rash Endocrine:  No polydipsia. No polyuria.  Psych: Denies SI/HI  No problems updated.  ALLERGIES: No Known Allergies  PAST MEDICAL HISTORY: Past Medical History:  Diagnosis Date  . Hyperlipidemia   . Hypertension     MEDICATIONS AT HOME: Prior to Admission medications   Medication Sig Start Date End Date Taking? Authorizing Provider  simvastatin (ZOCOR) 20 MG tablet Take 20 mg by mouth daily.   Yes [provider]  aspirin EC 81 MG tablet Take 1 tablet (81 mg total) by mouth daily. 06/13/17   Argentina Donovan, PA-C  lisinopril-hydrochlorothiazide (PRINZIDE,ZESTORETIC) 20-25 MG tablet Take 1 tablet by mouth daily. 06/13/17   Argentina Donovan, PA-C     Objective:  EXAM:   Vitals:   06/13/17 1051  BP: (!) 144/94  Pulse: 64  Resp: 16  Temp: 99 F (37.2 C)  TempSrc: Oral  Weight: 168 lb 9.6 oz (76.5 kg)    General appearance : A&OX3. NAD. Non-toxic-appearing HEENT: Atraumatic and Normocephalic.  PERRLA. EOM  intact.  Neck: supple, no JVD. No cervical lymphadenopathy. No thyromegaly Chest/Lungs:  Breathing-non-labored, Good air entry bilaterally, breath sounds normal without rales, rhonchi, or wheezing  CVS: S1 S2 regular, no murmurs, gallops, rubs  Extremities: Bilateral Lower Ext shows no edema, both legs are warm to touch with = pulse throughout Neurology:  CN II-XII grossly intact, Non focal.   Psych:  TP linear. J/I WNL. Normal speech. Appropriate eye contact and affect.  Skin:  No Rash  Data Review Lab Results  Component Value Date   HGBA1C 5.20 10/01/2015   HGBA1C 5.2 01/06/2010     Assessment & Plan   1. Hypertension, unspecified type Stop HCTZ 25mg .  Check BP daily and record and bring to next visit Start- - lisinopril-hydrochlorothiazide (PRINZIDE,ZESTORETIC) 20-25 MG tablet; Take 1 tablet by mouth daily.  Dispense: 90 tablet; Refill: 3 - aspirin EC 81 MG tablet; Take 1 tablet (81 mg total) by mouth daily.  Dispense: 100 tablet; Refill: 1 - Basic metabolic panel; Future to be drawn at visit with Theda Sers in 3 weeks.     Patient have been counseled extensively about nutrition and exercise  Return in about 3 weeks (around 07/04/2017) for Theda Sers for BP visit and lab visit-order in [place].  The patient was given clear instructions to go to ER or return to medical center if symptoms don't improve, worsen or new  problems develop. The patient verbalized understanding. The patient was told to call to get lab results if they haven't heard anything in the next week.     Freeman Caldron, PA-C [facility] and [facility] [place], [place]   06/13/2017, 11:14 AM

## 2017-06-13 NOTE — Progress Notes (Signed)
Pt concerned BP elevated Has BP log with him of readings

## 2017-07-04 ENCOUNTER — Other Ambulatory Visit: Payer: Self-pay

## 2017-07-04 ENCOUNTER — Ambulatory Visit: Payer: Self-pay | Admitting: Pharmacist

## 2017-07-04 NOTE — Progress Notes (Deleted)
   S:    Patient arrives ***.    Presents to the clinic for hypertension evaluation. Patient was referred on 06/13/17 by Freeman Caldron.  Patient was last seen by Primary Care Provider on 11/23/16.   Patient {Actions; denies-reports:120008} adherence with medications.  Current BP Medications include:  Lisinopril-HCTZ 20-25 mg daily.  Antihypertensives tried in the past include: none  Dietary habits include:  .medreviewdc   O:   Last 3 Office BP readings: BP Readings from Last 3 Encounters:  06/13/17 (!) 144/94  12/13/16 141/85  12/02/16 147/81    BMET    Component Value Date/Time   NA 140 07/28/2016 1156   K 4.2 07/28/2016 1156   CL 104 07/28/2016 1156   CO2 24 07/28/2016 1156   GLUCOSE 96 07/28/2016 1156   BUN 17 07/28/2016 1156   CREATININE 0.88 07/28/2016 1156   CALCIUM 9.5 07/28/2016 1156   GFRNONAA >89 07/28/2016 1156   GFRAA >89 07/28/2016 1156    A/P: Hypertension longstanding currently *** on current medications.  {Meds adjust:18428} ***.   Results reviewed and written information provided.   Total time in face-to-face counseling *** minutes.   F/U Clinic Visit with Dr. Marland Kitchen  Patient seen with Waverly Ferrari, PharmD Candidate

## 2017-07-18 ENCOUNTER — Telehealth: Payer: Self-pay | Admitting: Internal Medicine

## 2017-07-18 DIAGNOSIS — I10 Essential (primary) hypertension: Secondary | ICD-10-CM

## 2017-07-18 MED ORDER — LISINOPRIL-HYDROCHLOROTHIAZIDE 20-25 MG PO TABS: 1.0000 | ORAL_TABLET | Freq: Every day | ORAL | 0 refills | Status: DC

## 2017-07-18 MED FILL — LISINOPRIL-HCTZ 20-25 MG TA: 20-25 | 30 days supply | Qty: 30 | Fill #1

## 2017-07-18 NOTE — Telephone Encounter (Signed)
Pt called requesting medication refill on lisinopril-hydrochlorothiazide (PRINZIDE,ZESTORETIC) 20-25 MG tablet Pt states pharmacy does not have additional refills

## 2017-07-18 NOTE — Telephone Encounter (Signed)
Refilled

## 2017-07-20 ENCOUNTER — Ambulatory Visit: Payer: Self-pay | Attending: Internal Medicine | Admitting: Pharmacist

## 2017-07-20 ENCOUNTER — Ambulatory Visit: Payer: Self-pay | Admitting: Pharmacist

## 2017-07-20 VITALS — BP 101/67 | HR 107

## 2017-07-20 DIAGNOSIS — Z79899 Other long term (current) drug therapy: Secondary | ICD-10-CM | POA: Insufficient documentation

## 2017-07-20 DIAGNOSIS — I10 Essential (primary) hypertension: Secondary | ICD-10-CM | POA: Insufficient documentation

## 2017-07-20 NOTE — Patient Instructions (Addendum)
Thanks for coming to see [place]  Follow up with Dr. Doreene Burke for back pain.  Stop by lab today for labs

## 2017-07-20 NOTE — Progress Notes (Signed)
   S:    Patient arrives in good spirits. Presents to the clinic for hypertension evaluation. Patient was referred on 06/13/17 by Freeman Caldron. PCP is Dr. Doreene Burke.  Patient reports adherence with medications.  Current BP Medications include:  Lisinopril-HCTZ 20-25 mg daily  Antihypertensives tried in the past include: HCTZ (stopped to order combo with lisinopril)  Patient monitors his blood pressure at home. The ranges are 110s-120s/70s-80s.  Patient reports some minor back pain.  O:   Last 3 Office BP readings: BP Readings from Last 3 Encounters:  06/13/17 (!) 144/94  12/13/16 141/85  12/02/16 147/81    BMET    Component Value Date/Time   NA 140 07/28/2016 1156   K 4.2 07/28/2016 1156   CL 104 07/28/2016 1156   CO2 24 07/28/2016 1156   GLUCOSE 96 07/28/2016 1156   BUN 17 07/28/2016 1156   CREATININE 0.88 07/28/2016 1156   CALCIUM 9.5 07/28/2016 1156   GFRNONAA >89 07/28/2016 1156   GFRAA >89 07/28/2016 1156    A/P: Hypertension longstanding currently controlled on current medications in office and at home.  Continued current medications as prescribed. Patient to get labs on his way out (already ordered by Freeman Caldron).   Results reviewed and written information provided.   Total time in face-to-face counseling 10 minutes.  F/U Clinic Visit with Dr. Doreene Burke for back pain.  Patient seen with Waverly Ferrari, PharmD Candidate

## 2017-07-21 LAB — BASIC METABOLIC PANEL
BUN/Creatinine Ratio: 26 — ABNORMAL HIGH (ref 9–20)
BUN: 20 mg/dL (ref 6–24)
CO2: 24 mmol/L (ref 20–29)
CREATININE: 0.77 mg/dL (ref 0.76–1.27)
Calcium: 9.7 mg/dL (ref 8.7–10.2)
Chloride: 102 mmol/L (ref 96–106)
GFR calc Af Amer: 125 mL/min/{1.73_m2} (ref >59)
GFR calc non Af Amer: 108 mL/min/{1.73_m2} (ref >59)
GLUCOSE: 89 mg/dL (ref 65–99)
Potassium: 4.4 mmol/L (ref 3.5–5.2)
SODIUM: 143 mmol/L (ref 134–144)

## 2017-07-21 NOTE — Progress Notes (Signed)
Please inform patient of labs being normal and to follow up as planned.

## 2017-08-16 ENCOUNTER — Telehealth: Payer: Self-pay | Admitting: Internal Medicine

## 2017-08-16 DIAGNOSIS — I10 Essential (primary) hypertension: Secondary | ICD-10-CM

## 2017-08-16 NOTE — Telephone Encounter (Signed)
Pt called to request a refill for simvastatin (ZOCOR) 20 MG tablet  lisinopril-hydrochlorothiazide (PRINZIDE,ZESTORETIC) 20-25 MG tablet  Need to be sent to the [facility] [address]  Dr Please follow up

## 2017-08-17 MED ORDER — LISINOPRIL-HYDROCHLOROTHIAZIDE 20-25 MG PO TABS: 1.0000 | ORAL_TABLET | Freq: Every day | ORAL | 0 refills | Status: DC

## 2017-08-17 MED ORDER — SIMVASTATIN 20 MG PO TABS: 20.0000 mg | ORAL_TABLET | Freq: Every day | ORAL | 0 refills | Status: DC

## 2017-08-17 NOTE — Telephone Encounter (Signed)
Refilled

## 2017-10-02 ENCOUNTER — Telehealth: Payer: Self-pay | Admitting: Internal Medicine

## 2017-10-02 NOTE — Telephone Encounter (Signed)
Pt. Wife called stating that she saw on the news that the lisinopril-hydrochlorothiazide (PRINZIDE,ZESTORETIC) 20-25 MG tablet  Has been taking out of the market b/c is causes cancer. Pt. Does not know what to do. Please f/u

## 2017-10-03 NOTE — Telephone Encounter (Signed)
Message  Was relayed by Shelby Dubin. Medication was not affected by recall.

## 2017-11-06 ENCOUNTER — Other Ambulatory Visit: Payer: Self-pay | Admitting: Pharmacist

## 2017-11-06 MED ORDER — SIMVASTATIN 20 MG PO TABS: 20.0000 mg | ORAL_TABLET | Freq: Every day | ORAL | 0 refills | Status: DC

## 2017-11-06 MED FILL — LISINOPRIL-HCTZ 20-25 MG TA: 20-25 | 30 days supply | Qty: 30 | Fill #2

## 2017-11-06 MED FILL — SIMVASTATIN 20 MG TABLET: 20 | 30 days supply | Qty: 30 | Fill #0

## 2018-01-10 ENCOUNTER — Ambulatory Visit: Payer: Self-pay | Attending: Internal Medicine

## 2018-01-10 ENCOUNTER — Telehealth: Payer: Self-pay | Admitting: Internal Medicine

## 2018-01-10 DIAGNOSIS — I10 Essential (primary) hypertension: Secondary | ICD-10-CM

## 2018-01-10 MED ORDER — SIMVASTATIN 20 MG PO TABS: 20.0000 mg | ORAL_TABLET | Freq: Every day | ORAL | 0 refills | Status: DC

## 2018-01-10 MED ORDER — LISINOPRIL-HYDROCHLOROTHIAZIDE 20-25 MG PO TABS: 1.0000 | ORAL_TABLET | Freq: Every day | ORAL | 0 refills | Status: DC

## 2018-01-10 MED FILL — SIMVASTATIN 20 MG TABLET: 20 | 30 days supply | Qty: 30 | Fill #0

## 2018-01-10 MED FILL — LISINOPRIL-HCTZ 20-25 MG TA: 20-25 | 30 days supply | Qty: 30 | Fill #0

## 2018-01-10 NOTE — Telephone Encounter (Signed)
Pt came to the office to request a refill for lisinopril-hydrochlorothiazide (PRINZIDE,ZESTORETIC) 20-25 MG tablet simvastatin (ZOCOR) 20 MG tablet  please sent it to [facility] pharmacy

## 2018-01-10 NOTE — Telephone Encounter (Signed)
Refilled x 30 days - needs office visit for further refills.

## 2018-01-24 ENCOUNTER — Ambulatory Visit: Payer: Self-pay | Attending: Nurse Practitioner | Admitting: Nurse Practitioner

## 2018-01-24 ENCOUNTER — Encounter: Payer: Self-pay | Admitting: Nurse Practitioner

## 2018-01-24 VITALS — BP 128/85 | HR 62 | Temp 98.2°F | Ht 66.0 in | Wt 165.8 lb

## 2018-01-24 DIAGNOSIS — I10 Essential (primary) hypertension: Secondary | ICD-10-CM | POA: Insufficient documentation

## 2018-01-24 DIAGNOSIS — Z7982 Long term (current) use of aspirin: Secondary | ICD-10-CM | POA: Insufficient documentation

## 2018-01-24 DIAGNOSIS — Z23 Encounter for immunization: Secondary | ICD-10-CM | POA: Insufficient documentation

## 2018-01-24 DIAGNOSIS — E782 Mixed hyperlipidemia: Secondary | ICD-10-CM | POA: Insufficient documentation

## 2018-01-24 DIAGNOSIS — Z79899 Other long term (current) drug therapy: Secondary | ICD-10-CM | POA: Insufficient documentation

## 2018-01-24 DIAGNOSIS — G40909 Epilepsy, unspecified, not intractable, without status epilepticus: Secondary | ICD-10-CM

## 2018-01-24 DIAGNOSIS — R569 Unspecified convulsions: Secondary | ICD-10-CM | POA: Insufficient documentation

## 2018-01-24 MED ORDER — LISINOPRIL-HYDROCHLOROTHIAZIDE 20-25 MG PO TABS: 1.0000 | ORAL_TABLET | Freq: Every day | ORAL | 1 refills | Status: DC

## 2018-01-24 MED ORDER — SIMVASTATIN 20 MG PO TABS: 20.0000 mg | ORAL_TABLET | Freq: Every day | ORAL | 0 refills | Status: DC

## 2018-01-24 MED ORDER — ASPIRIN EC 81 MG PO TBEC: 81.0000 mg | DELAYED_RELEASE_TABLET | Freq: Every day | ORAL | 1 refills | Status: DC

## 2018-01-24 NOTE — Patient Instructions (Signed)
Hipertensin  Hypertension  El trmino hipertensin es otra forma de denominar a la presin arterial elevada. La presin arterial elevada fuerza al corazn a trabajar ms para bombear la sangre. Esto puede causar problemas con el paso del tiempo.  Una lectura de presin arterial est compuesta por 2 nmeros. Hay un nmero superior (sistlico) sobre un nmero inferior (diastlico). Lo ideal es tener la presin arterial por debajo de 120/80. Las decisiones saludables pueden ayudarle a disminuir su presin arterial. Es posible que necesite medicamentos que le ayuden a disminuir su presin arterial si:   Su presin arterial no disminuye mediante decisiones saludables.   Su presin arterial est por encima de 130/80.    Siga estas instrucciones en su casa:  Comida y bebida   Si se lo indican, siga el plan de alimentacin de DASH (Dietary Approaches to Stop Hypertension, Maneras de alimentarse para detener la hipertensin). Esta dieta incluye:  ? Que la mitad del plato de cada comida sea de frutas y verduras.  ? Que un cuarto del plato de cada comida sea de cereales integrales. Los cereales integrales incluyen pasta integral, arroz integral y pan integral.  ? Comer y beber productos lcteos con bajo contenido de grasa, como leche descremada o yogur bajo en grasas.  ? Que un cuarto del plato de cada comida sea de protenas bajas en grasa (magras). Las protenas bajas en grasa incluyen pescado, pollo sin piel, huevos, frijoles y tofu.  ? Evitar consumir carne grasa, carne curada y procesada, o pollo con piel.  ? Evitar consumir alimentos prehechos o procesados.   Consuma menos de 1500 mg de sal (sodio) por da.   Limite el consumo de alcohol a no ms de 1 medida por da si es mujer y no est embarazada y a 2 medidas por da si es hombre. Una medida equivale a 12onzas de cerveza, 5onzas de vino o 1onzas de bebidas alcohlicas de alta graduacin.  Estilo de vida   Trabaje con su mdico para mantenerse en un peso  saludable o para perder peso. Pregntele a su mdico cul es el peso recomendable para usted.   Realice al menos 30 minutos de ejercicio que haga que se acelere su corazn (ejercicio aerbico) la mayora de los das de la semana. Estos pueden incluir caminar, nadar o andar en bicicleta.   Realice al menos 30 minutos de ejercicio que fortalezca sus msculos (ejercicios de resistencia) al menos 3 das a la semana. Estos pueden incluir levantar pesas o hacer pilates.   No consuma ningn producto que contenga nicotina o tabaco. Esto incluye cigarrillos y cigarrillos electrnicos. Si necesita ayuda para dejar de fumar, consulte al mdico.   Controle su presin arterial en su casa tal como le indic el mdico.   Concurra a todas las visitas de control como se lo haya indicado el mdico. Esto es importante.  Medicamentos   Tome los medicamentos de venta libre y los recetados solamente como se lo haya indicado el mdico. Siga cuidadosamente las indicaciones.   No omita las dosis de medicamentos para la presin arterial. Los medicamentos pierden eficacia si omite dosis. El hecho de omitir las dosis tambin aumenta el riesgo de otros problemas.   Pregntele a su mdico a qu efectos secundarios o reacciones a los medicamentos debe prestar atencin.  Comunquese con un mdico si:   Piensa que tiene una reaccin a los medicamentos que est tomando.   Tiene dolores de cabeza frecuentes (recurrentes).   Siente mareos.   Tiene hinchazn   en los tobillos.   Tiene problemas de visin.  Solicite ayuda de inmediato si:   Siente un dolor de cabeza muy intenso.   Comienza a sentirse confundido.   Se siente dbil o adormecido.   Siente que va a desmayarse.   Siente un dolor muy intenso en:  ? El pecho.  ? El vientre (abdomen).   Devuelve (vomita) ms de una vez.   Tiene dificultad para respirar.  Resumen   El trmino hipertensin es otra forma de denominar a la presin arterial elevada.   Las decisiones saludables  pueden ayudarle a disminuir su presin arterial. Si no puede controlar su presin arterial mediante decisiones saludables, es posible que deba tomar medicamentos.  Esta informacin no tiene como fin reemplazar el consejo del mdico. Asegrese de hacerle al mdico cualquier pregunta que tenga.  Document Released: 04/06/2010 Document Revised: 09/28/2016 Document Reviewed: 09/28/2016  Elsevier Interactive Patient Education  2018 Elsevier Inc.

## 2018-01-24 NOTE — Progress Notes (Signed)
Assessment & Plan:  Shawn Porter was seen today for establish care.  Diagnoses and all orders for this visit:  Hypertension, unspecified type -     lisinopril-hydrochlorothiazide (PRINZIDE,ZESTORETIC) 20-25 MG tablet; Take 1 tablet by mouth daily. -     aspirin EC 81 MG tablet; Take 1 tablet (81 mg total) by mouth daily. -     Basic metabolic panel -     CBC Continue all antihypertensives as prescribed.  Remember to bring in your blood pressure log with you for your follow up appointment.  DASH/Mediterranean Diets are healthier choices for HTN.    Mixed hyperlipidemia -     simvastatin (ZOCOR) 20 MG tablet; Take 1 tablet (20 mg total) by mouth daily. Work on a low fat, heart healthy diet and participate in regular aerobic exercise program to control as well by working out at least 150 minutes per week. No fried foods. No junk foods, sodas, sugary drinks, unhealthy snacking, or smoking.   Immunization due -     Flu Vaccine QUAD 6+ mos PF IM (Fluarix Quad PF)   History of Seizures Does not follow up with Neurology. Stopped taking tegretol several years ago. Declines neurology referral today or refill of tegretol. Denies any recent seizure activity.    Shawn Porter has been counseled on age-appropriate routine health concerns for screening and prevention. These are reviewed and up-to-date. Referrals have been placed accordingly. Immunizations are up-to-date or declined.    Subjective:   Chief Complaint  Shawn Porter presents with  . Establish Care    Shawn Porter is here to establish care for hypertension and cholesterol.    [name] 47 y.o. male presents to office today to establish care. VRI was used to communicate directly with Shawn Porter for the entire encounter including providing detailed Shawn Porter instructions.   Essential Hypertension  Chronic. Stable. Endorses medication compliance taking prinzide 20-25 daily. Does not add salt to foods. Checking blood pressure at home with  average readings: 120/80s. Denies chest pain, shortness of breath, palpitations, lightheadedness, dizziness, headaches, visual disturbances or BLE edema.  BP Readings from Last 3 Encounters:  01/24/18 128/85  07/20/17 101/67  06/13/17 (!) 144/94   Hyperlipidemia Shawn Porter presents for follow up to hyperlipidemia.  He is medication compliant taking zocor in the evenings as precribed. He is diet compliant and denies chest pain or statin intolerance including myalgias. LDL is not at goal. Fasting lipid panel pending.  Lab Results  Component Value Date   CHOL 226 (H) 10/01/2015   Lab Results  Component Value Date   HDL 37 (L) 10/01/2015   Lab Results  Component Value Date   LDLCALC 143 (H) 10/01/2015   Lab Results  Component Value Date   TRIG 229 (H) 10/01/2015   Lab Results  Component Value Date   CHOLHDL 6.1 (H) 10/01/2015      Review of Systems  Constitutional: Negative for fever, malaise/fatigue and weight loss.  HENT: Negative.  Negative for nosebleeds.   Eyes: Negative.  Negative for blurred vision, double vision and photophobia.  Respiratory: Negative.  Negative for cough and shortness of breath.   Cardiovascular: Negative.  Negative for chest pain, palpitations and leg swelling.  Gastrointestinal: Negative.  Negative for heartburn, nausea and vomiting.  Musculoskeletal: Negative.  Negative for myalgias.  Neurological: Negative.  Negative for dizziness, focal weakness, seizures and headaches.  Psychiatric/Behavioral: Negative.  Negative for suicidal ideas.    Past Medical History:  Diagnosis Date  . Hyperlipidemia   . Hypertension  Past Surgical History:  Procedure Laterality Date  . HERNIA REPAIR      Family History  Problem Relation Age of Onset  . Diabetes Sister     Social History Reviewed with no changes to be made today.   Outpatient Medications Prior to Visit  Medication Sig Dispense Refill  . lisinopril-hydrochlorothiazide  (PRINZIDE,ZESTORETIC) 20-25 MG tablet Take 1 tablet by mouth daily. 30 tablet 0  . simvastatin (ZOCOR) 20 MG tablet Take 1 tablet (20 mg total) by mouth daily. 30 tablet 0  . aspirin EC 81 MG tablet Take 1 tablet (81 mg total) by mouth daily. (Shawn Porter not taking: Reported on 01/24/2018) 100 tablet 1   No facility-administered medications prior to visit.     No Known Allergies     Objective:    BP 128/85 (BP Location: Left Arm, Shawn Porter Position: Sitting, Cuff Size: Normal)   Pulse 62   Temp 98.2 F (36.8 C) (Oral)   Ht 5\' 6"  (1.676 m)   Wt 165 lb 12.8 oz (75.2 kg)   SpO2 97%   BMI 26.76 kg/m  Wt Readings from Last 3 Encounters:  01/24/18 165 lb 12.8 oz (75.2 kg)  06/13/17 168 lb 9.6 oz (76.5 kg)  11/23/16 172 lb (78 kg)    Physical Exam  Constitutional: He is oriented to person, place, and time. He appears well-developed and well-nourished. He is cooperative.  HENT:  Head: Normocephalic and atraumatic.  Eyes: EOM are normal.  Neck: Normal range of motion.  Cardiovascular: Normal rate, regular rhythm and normal heart sounds. Exam reveals no gallop and no friction rub.  No murmur heard. Pulmonary/Chest: Effort normal and breath sounds normal. No tachypnea. No respiratory distress. He has no decreased breath sounds. He has no wheezes. He has no rhonchi. He has no rales. He exhibits no tenderness.  Abdominal: Soft. Bowel sounds are normal.  Musculoskeletal: Normal range of motion. He exhibits no edema.  Neurological: He is alert and oriented to person, place, and time. Coordination normal.  Skin: Skin is warm and dry.  Psychiatric: He has a normal mood and affect. His behavior is normal. Judgment and thought content normal.  Nursing note and vitals reviewed.      Shawn Porter has been counseled extensively about nutrition and exercise as well as the importance of adherence with medications and regular follow-up. The Shawn Porter was given clear instructions to go to ER or return to  medical center if symptoms don't improve, worsen or new problems develop. The Shawn Porter verbalized understanding.   Follow-up: Return in about 6 months (around 07/27/2018).   Gildardo Pounds, FNP-BC [facility] and [place], [place]   01/26/2018, 6:50 PM

## 2018-01-25 LAB — BASIC METABOLIC PANEL
BUN / CREAT RATIO: 19 (ref 9–20)
BUN: 16 mg/dL (ref 6–24)
CHLORIDE: 98 mmol/L (ref 96–106)
CO2: 24 mmol/L (ref 20–29)
CREATININE: 0.83 mg/dL (ref 0.76–1.27)
Calcium: 10.1 mg/dL (ref 8.7–10.2)
GFR calc Af Amer: 121 mL/min/{1.73_m2} (ref >59)
GFR calc non Af Amer: 105 mL/min/{1.73_m2} (ref >59)
GLUCOSE: 100 mg/dL — AB (ref 65–99)
POTASSIUM: 4.6 mmol/L (ref 3.5–5.2)
SODIUM: 138 mmol/L (ref 134–144)

## 2018-01-25 LAB — CBC
Hematocrit: 49.5 % (ref 37.5–51.0)
Hemoglobin: 17.3 g/dL (ref 13.0–17.7)
MCH: 33.1 pg — ABNORMAL HIGH (ref 26.6–33.0)
MCHC: 34.9 g/dL (ref 31.5–35.7)
MCV: 95 fL (ref 79–97)
Platelets: 303 10*3/uL (ref 150–379)
RBC: 5.22 x10E6/uL (ref 4.14–5.80)
RDW: 13.1 % (ref 12.3–15.4)
WBC: 5.7 10*3/uL (ref 3.4–10.8)

## 2018-01-26 ENCOUNTER — Encounter: Payer: Self-pay | Admitting: Nurse Practitioner

## 2018-02-15 MED FILL — SIMVASTATIN 20 MG TABLET: 20 | 30 days supply | Qty: 30 | Fill #0 | Status: TO

## 2018-02-15 MED FILL — LISINOPRIL-HCTZ 20-25 MG TA: 20-25 | 30 days supply | Qty: 30 | Fill #0 | Status: TO

## 2018-03-29 ENCOUNTER — Other Ambulatory Visit: Payer: Self-pay

## 2018-03-29 DIAGNOSIS — E782 Mixed hyperlipidemia: Secondary | ICD-10-CM

## 2018-03-29 MED ORDER — SIMVASTATIN 20 MG PO TABS: 20.0000 mg | ORAL_TABLET | Freq: Every day | ORAL | 3 refills | Status: DC

## 2018-05-28 ENCOUNTER — Ambulatory Visit: Payer: Self-pay | Attending: Nurse Practitioner

## 2018-06-15 ENCOUNTER — Encounter (HOSPITAL_COMMUNITY): Payer: Self-pay

## 2018-06-15 ENCOUNTER — Ambulatory Visit (INDEPENDENT_AMBULATORY_CARE_PROVIDER_SITE_OTHER): Payer: Self-pay

## 2018-06-15 ENCOUNTER — Ambulatory Visit (HOSPITAL_COMMUNITY)
Admission: EM | Admit: 2018-06-15 | Discharge: 2018-06-15 | Disposition: A | Discharge status: Home / Self Care | Payer: Self-pay | Attending: Family Medicine | Admitting: Family Medicine

## 2018-06-15 DIAGNOSIS — R61 Generalized hyperhidrosis: Secondary | ICD-10-CM

## 2018-06-15 DIAGNOSIS — S161XXA Strain of muscle, fascia and tendon at neck level, initial encounter: Secondary | ICD-10-CM

## 2018-06-15 DIAGNOSIS — R231 Pallor: Secondary | ICD-10-CM

## 2018-06-15 DIAGNOSIS — M542 Cervicalgia: Secondary | ICD-10-CM

## 2018-06-15 DIAGNOSIS — M25511 Pain in right shoulder: Secondary | ICD-10-CM

## 2018-06-15 DIAGNOSIS — M5489 Other dorsalgia: Secondary | ICD-10-CM

## 2018-06-15 LAB — GLUCOSE, CAPILLARY: Glucose-Capillary: 107 mg/dL — ABNORMAL HIGH (ref 70–99)

## 2018-06-15 MED ORDER — KETOROLAC TROMETHAMINE 30 MG/ML IJ SOLN
30.0000 mg | Freq: Once | INTRAMUSCULAR | Status: AC
  Administered 2018-06-15: 30 mg via INTRAMUSCULAR

## 2018-06-15 MED ORDER — TIZANIDINE HCL 2 MG PO CAPS: 2.0000 mg | ORAL_CAPSULE | Freq: Three times a day (TID) | ORAL | 0 refills | Status: DC

## 2018-06-15 MED ORDER — MELOXICAM 7.5 MG PO TABS: 7.5000 mg | ORAL_TABLET | Freq: Every day | ORAL | 0 refills | Status: DC

## 2018-06-15 MED ORDER — KETOROLAC TROMETHAMINE 30 MG/ML IJ SOLN
INTRAMUSCULAR | Status: AC
  Filled 2018-06-15: qty 1
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
## 2018-06-15 NOTE — ED Notes (Signed)
Pt resting in bed.  Denies any complaints.

## 2018-06-15 NOTE — ED Notes (Signed)
Pt was escorted to X-ray by Elige Ko.  While in x-ray the patient began feeling dizzy.  He passed out with assistance to the ground from [place]. Found patient in the floor of the x-ray room with Elige Ko helping to hold him up.  Denies any head trauma or injury from the fall. Patient states after his shot and he stood up he began feeling dizzy. Loura Halt FNP present to assess patient. CBG and BP wnl. Bradycardia at 35bpm.  Pt assisted into bed by this RN and Educational psychologist. EKG obtained and patient placed on monitor. Once the patient laid down he stated he was starting to feel better. Vital signs now within normal limits. Pt resting in bed quietly. Will continue to monitor.

## 2018-06-15 NOTE — ED Notes (Signed)
Pt assisted up to the wheel chair by Traci FNP. Denies any dizziness at this time.

## 2018-06-15 NOTE — ED Notes (Signed)
Pt ambulatory , denies any dizziness or pain. Alert and oriented.

## 2018-06-15 NOTE — Discharge Instructions (Signed)
It was nice meeting you!!  Your x ray was negative for any fracture.  I believe you have a muscle spasm in your upper back behind your shoulder.  We will give you some antiinflammatory medication and low dose muscle relaxant.  Follow up if not getting better or for worsening symptoms.

## 2018-06-21 MED FILL — MELOXICAM 7.5 MG TABLET: 7.5 | 30 days supply | Qty: 30 | Fill #0

## 2018-06-21 MED FILL — tiZANidine HCL 2 MG TABS: 2 | 10 days supply | Qty: 30 | Fill #0

## 2018-06-29 ENCOUNTER — Ambulatory Visit: Payer: Self-pay | Admitting: Nurse Practitioner

## 2018-08-23 ENCOUNTER — Other Ambulatory Visit: Payer: Self-pay | Admitting: *Deleted

## 2018-08-23 DIAGNOSIS — E782 Mixed hyperlipidemia: Secondary | ICD-10-CM

## 2018-08-23 MED ORDER — SIMVASTATIN 20 MG PO TABS: 20.0000 mg | ORAL_TABLET | Freq: Every day | ORAL | 3 refills | Status: DC

## 2018-09-11 ENCOUNTER — Ambulatory Visit: Payer: Self-pay | Admitting: Nurse Practitioner

## 2018-09-20 ENCOUNTER — Telehealth: Payer: Self-pay | Admitting: Nurse Practitioner

## 2018-09-20 NOTE — Telephone Encounter (Signed)
Shawn Porter wrote that the patient must have appt for refills on his last RX for this drug and he has not been seen yet. Try to get Shawn Porter schedule an appt and I can send request to [facility]

## 2018-09-20 NOTE — Telephone Encounter (Signed)
1) Medication(s) Requested (by name): Lisinopril  2) Pharmacy of Choice:  [facility] on [address]

## 2018-09-21 NOTE — Telephone Encounter (Signed)
Patients wife called requesting a medication refill on  -lisinopril-hydrochlorothiazide (PRINZIDE,ZESTORETIC) 20-25 MG tablet Provider stated that due to many missed appointment she will not be able to refill medication prior to revaluation

## 2018-09-25 ENCOUNTER — Ambulatory Visit: Payer: Self-pay | Attending: Nurse Practitioner | Admitting: Nurse Practitioner

## 2018-09-25 ENCOUNTER — Encounter: Payer: Self-pay | Admitting: Nurse Practitioner

## 2018-09-25 VITALS — BP 133/81 | HR 65 | Temp 98.6°F | Ht 66.0 in | Wt 173.0 lb

## 2018-09-25 DIAGNOSIS — M542 Cervicalgia: Secondary | ICD-10-CM | POA: Insufficient documentation

## 2018-09-25 DIAGNOSIS — I10 Essential (primary) hypertension: Secondary | ICD-10-CM | POA: Insufficient documentation

## 2018-09-25 DIAGNOSIS — Z23 Encounter for immunization: Secondary | ICD-10-CM | POA: Insufficient documentation

## 2018-09-25 DIAGNOSIS — Z833 Family history of diabetes mellitus: Secondary | ICD-10-CM | POA: Insufficient documentation

## 2018-09-25 DIAGNOSIS — Z79899 Other long term (current) drug therapy: Secondary | ICD-10-CM | POA: Insufficient documentation

## 2018-09-25 DIAGNOSIS — E782 Mixed hyperlipidemia: Secondary | ICD-10-CM | POA: Insufficient documentation

## 2018-09-25 DIAGNOSIS — M545 Low back pain: Secondary | ICD-10-CM | POA: Insufficient documentation

## 2018-09-25 MED ORDER — LISINOPRIL-HYDROCHLOROTHIAZIDE 20-25 MG PO TABS: 1.0000 | ORAL_TABLET | Freq: Every day | ORAL | 1 refills | Status: DC

## 2018-09-25 MED ORDER — SIMVASTATIN 20 MG PO TABS: 20.0000 mg | ORAL_TABLET | Freq: Every day | ORAL | 1 refills | Status: DC

## 2018-09-25 MED ORDER — TIZANIDINE HCL 2 MG PO CAPS
2.0000 mg | ORAL_CAPSULE | Freq: Three times a day (TID) | ORAL | 1 refills | Status: DC
Start: 2018-09-25 — End: 2018-11-20

## 2018-09-25 MED FILL — tiZANidine HCL 2 MG TABS: 2 | 10 days supply | Qty: 30 | Fill #0

## 2018-09-25 MED FILL — LISINOPRIL-HCTZ 20-25 MG TA: 20-25 | 30 days supply | Qty: 30 | Fill #0

## 2018-09-25 MED FILL — SIMVASTATIN 20 MG TABLET: 20 | 30 days supply | Qty: 30 | Fill #0
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
## 2018-10-17 ENCOUNTER — Encounter: Payer: Self-pay | Admitting: Nurse Practitioner

## 2018-11-06 MED FILL — LISINOPRIL-HCTZ 20-25 MG TA: 20-25 | 30 days supply | Qty: 30 | Fill #1

## 2018-11-06 MED FILL — SIMVASTATIN 20 MG TABLET: 20 | 30 days supply | Qty: 30 | Fill #1

## 2018-11-06 MED FILL — tiZANidine HCL 2 MG TABS: 2 | 10 days supply | Qty: 30 | Fill #1

## 2018-11-20 ENCOUNTER — Ambulatory Visit: Payer: Self-pay | Attending: Nurse Practitioner | Admitting: Nurse Practitioner

## 2018-11-20 ENCOUNTER — Encounter: Payer: Self-pay | Admitting: Nurse Practitioner

## 2018-11-20 VITALS — BP 113/71 | HR 75 | Temp 98.3°F | Ht 66.0 in | Wt 170.0 lb

## 2018-11-20 DIAGNOSIS — I10 Essential (primary) hypertension: Secondary | ICD-10-CM | POA: Insufficient documentation

## 2018-11-20 DIAGNOSIS — Z0001 Encounter for general adult medical examination with abnormal findings: Secondary | ICD-10-CM

## 2018-11-20 DIAGNOSIS — E782 Mixed hyperlipidemia: Secondary | ICD-10-CM | POA: Insufficient documentation

## 2018-11-20 DIAGNOSIS — M542 Cervicalgia: Secondary | ICD-10-CM | POA: Insufficient documentation

## 2018-11-20 DIAGNOSIS — G8929 Other chronic pain: Secondary | ICD-10-CM | POA: Insufficient documentation

## 2018-11-20 DIAGNOSIS — Z7982 Long term (current) use of aspirin: Secondary | ICD-10-CM | POA: Insufficient documentation

## 2018-11-20 DIAGNOSIS — Z79899 Other long term (current) drug therapy: Secondary | ICD-10-CM | POA: Insufficient documentation

## 2018-11-20 DIAGNOSIS — Z0000 Encounter for general adult medical examination without abnormal findings: Secondary | ICD-10-CM

## 2018-11-20 MED ORDER — SIMVASTATIN 20 MG PO TABS: 20.0000 mg | ORAL_TABLET | Freq: Every day | ORAL | 1 refills | Status: DC

## 2018-11-20 MED ORDER — LISINOPRIL-HYDROCHLOROTHIAZIDE 20-25 MG PO TABS: 1.0000 | ORAL_TABLET | Freq: Every day | ORAL | 1 refills | Status: DC

## 2018-11-20 MED ORDER — TIZANIDINE HCL 2 MG PO CAPS: 2.0000 mg | ORAL_CAPSULE | Freq: Three times a day (TID) | ORAL | 1 refills | Status: DC
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
## 2018-11-21 LAB — CBC
HEMOGLOBIN: 17.7 g/dL (ref 13.0–17.7)
Hematocrit: 49.9 % (ref 37.5–51.0)
MCH: 33.4 pg — ABNORMAL HIGH (ref 26.6–33.0)
MCHC: 35.5 g/dL (ref 31.5–35.7)
MCV: 94 fL (ref 79–97)
PLATELETS: 332 10*3/uL (ref 150–450)
RBC: 5.3 x10E6/uL (ref 4.14–5.80)
RDW: 12.6 % (ref 11.6–15.4)
WBC: 7.9 10*3/uL (ref 3.4–10.8)

## 2018-11-21 LAB — CMP14+EGFR
A/G RATIO: 1.9 (ref 1.2–2.2)
ALT: 37 IU/L (ref 0–44)
AST: 22 IU/L (ref 0–40)
Albumin: 4.8 g/dL (ref 4.0–5.0)
Alkaline Phosphatase: 73 IU/L (ref 39–117)
BUN/Creatinine Ratio: 28 — ABNORMAL HIGH (ref 9–20)
BUN: 21 mg/dL (ref 6–24)
Bilirubin Total: 0.6 mg/dL (ref 0.0–1.2)
CALCIUM: 10.2 mg/dL (ref 8.7–10.2)
CO2: 24 mmol/L (ref 20–29)
CREATININE: 0.75 mg/dL — AB (ref 0.76–1.27)
Chloride: 98 mmol/L (ref 96–106)
GFR, EST AFRICAN AMERICAN: 125 mL/min/{1.73_m2} (ref >59)
GFR, EST NON AFRICAN AMERICAN: 108 mL/min/{1.73_m2} (ref >59)
GLUCOSE: 96 mg/dL (ref 65–99)
Globulin, Total: 2.5 g/dL (ref 1.5–4.5)
Potassium: 4.4 mmol/L (ref 3.5–5.2)
Sodium: 139 mmol/L (ref 134–144)
TOTAL PROTEIN: 7.3 g/dL (ref 6.0–8.5)

## 2018-11-21 LAB — LIPID PANEL
CHOL/HDL RATIO: 5 ratio (ref 0.0–5.0)
Cholesterol, Total: 196 mg/dL (ref 100–199)
HDL: 39 mg/dL — AB (ref >39)
TRIGLYCERIDES: 445 mg/dL — AB (ref 0–149)

## 2018-11-26 ENCOUNTER — Other Ambulatory Visit: Payer: Self-pay | Admitting: Nurse Practitioner

## 2018-11-26 MED ORDER — ROSUVASTATIN CALCIUM 20 MG PO TABS: 20.0000 mg | ORAL_TABLET | Freq: Every day | ORAL | 3 refills | Status: DC

## 2018-11-27 MED FILL — ROSUVASTATIN CALCIUM 20 MG: 20 | 30 days supply | Qty: 30 | Fill #0

## 2018-12-04 ENCOUNTER — Other Ambulatory Visit: Payer: Self-pay

## 2018-12-04 ENCOUNTER — Encounter (HOSPITAL_COMMUNITY): Payer: Self-pay

## 2018-12-04 ENCOUNTER — Emergency Department (HOSPITAL_COMMUNITY)
Admission: EM | Admit: 2018-12-04 | Discharge: 2018-12-04 | Disposition: A | Discharge status: Home / Self Care | Payer: Self-pay | Attending: Emergency Medicine | Admitting: Emergency Medicine

## 2018-12-04 DIAGNOSIS — E785 Hyperlipidemia, unspecified: Secondary | ICD-10-CM | POA: Insufficient documentation

## 2018-12-04 DIAGNOSIS — R55 Syncope and collapse: Secondary | ICD-10-CM | POA: Insufficient documentation

## 2018-12-04 DIAGNOSIS — I10 Essential (primary) hypertension: Secondary | ICD-10-CM | POA: Insufficient documentation

## 2018-12-04 DIAGNOSIS — B9789 Other viral agents as the cause of diseases classified elsewhere: Secondary | ICD-10-CM

## 2018-12-04 DIAGNOSIS — Z79899 Other long term (current) drug therapy: Secondary | ICD-10-CM | POA: Insufficient documentation

## 2018-12-04 DIAGNOSIS — E860 Dehydration: Secondary | ICD-10-CM | POA: Insufficient documentation

## 2018-12-04 DIAGNOSIS — J069 Acute upper respiratory infection, unspecified: Secondary | ICD-10-CM | POA: Insufficient documentation

## 2018-12-04 LAB — CBC
HCT: 47.8 % (ref 39.0–52.0)
HEMOGLOBIN: 17.4 g/dL — AB (ref 13.0–17.0)
MCH: 32.8 pg (ref 26.0–34.0)
MCHC: 36.4 g/dL — AB (ref 30.0–36.0)
MCV: 90 fL (ref 80.0–100.0)
Platelets: 275 10*3/uL (ref 150–400)
RBC: 5.31 MIL/uL (ref 4.22–5.81)
RDW: 11.9 % (ref 11.5–15.5)
WBC: 6.1 10*3/uL (ref 4.0–10.5)
nRBC: 0 % (ref 0.0–0.2)

## 2018-12-04 LAB — BASIC METABOLIC PANEL
ANION GAP: 13 (ref 5–15)
BUN: 16 mg/dL (ref 6–20)
CALCIUM: 9.1 mg/dL (ref 8.9–10.3)
CO2: 21 mmol/L — AB (ref 22–32)
Chloride: 102 mmol/L (ref 98–111)
Creatinine, Ser: 0.81 mg/dL (ref 0.61–1.24)
GFR calc Af Amer: >60 mL/min (ref >60)
GFR calc non Af Amer: >60 mL/min (ref >60)
GLUCOSE: 130 mg/dL — AB (ref 70–99)
POTASSIUM: 3.6 mmol/L (ref 3.5–5.1)
Sodium: 136 mmol/L (ref 135–145)

## 2018-12-04 LAB — URINALYSIS, ROUTINE W REFLEX MICROSCOPIC
Bilirubin Urine: NEGATIVE
GLUCOSE, UA: NEGATIVE mg/dL
HGB URINE DIPSTICK: NEGATIVE
Ketones, ur: NEGATIVE mg/dL
Leukocytes, UA: NEGATIVE
Nitrite: NEGATIVE
Protein, ur: NEGATIVE mg/dL
SPECIFIC GRAVITY, URINE: 1.017 (ref 1.005–1.030)
pH: 6 (ref 5.0–8.0)

## 2018-12-04 MED ORDER — SODIUM CHLORIDE 0.9% FLUSH
3.0000 mL | Freq: Once | INTRAVENOUS | Status: AC
  Administered 2018-12-04: 3 mL via INTRAVENOUS

## 2018-12-04 MED ORDER — PROMETHAZINE-DM 6.25-15 MG/5ML PO SYRP: 5.0000 mL | ORAL_SOLUTION | Freq: Four times a day (QID) | ORAL | 0 refills | Status: DC | PRN

## 2018-12-04 MED ORDER — GUAIFENESIN ER 1200 MG PO TB12: 1.0000 | ORAL_TABLET | Freq: Two times a day (BID) | ORAL | 0 refills | Status: DC

## 2018-12-04 MED ORDER — SODIUM CHLORIDE 0.9 % IV BOLUS
1000.0000 mL | Freq: Once | INTRAVENOUS | Status: AC
  Administered 2018-12-04: 1000 mL via INTRAVENOUS

## 2018-12-04 MED FILL — PROMETHAZINE W/DM SYRUP: 6.25-15 | 6 days supply | Qty: 120 | Fill #0

## 2018-12-04 NOTE — ED Triage Notes (Signed)
Pt arrives via [facility], syncopal episode lasting approximately 1 min, fell face first, not on thinners.  Last night took Nyquill and laxative, bowel movement prior to passing out. On arrival of first medic had another near syncopal episode with brady down to 30's and excruciating abdominal pain. NAD on arrival  For [facility] BP 110/60 following 250 bolus, HR 56, CBG 127

## 2018-12-04 NOTE — Discharge Instructions (Addendum)
Increase your fluid intake.  I would suggest taking MiraLAX for your constipation along with stool softeners.

## 2018-12-04 NOTE — ED Notes (Signed)
Pt was ambulated around [facility] nurse's station. Pt had steady gait, no balance/weakness noted or complained of. HR upon standing and initial walking was in the low 90s, and Pt did brady down to 72 at the lowest, where it stayed until he returned to his hall bed. RN aware.

## 2018-12-04 NOTE — ED Provider Notes (Signed)
[facility] EMERGENCY DEPARTMENT Provider Note   CSN: [id number] Arrival date & time: 12/04/18  1448     History   Chief Complaint No chief complaint on file.   [name] is a 48 y.o. male.  HPI Patient presents to the emergency department with flulike symptoms over the last 3 days.  The patient went to the bathroom in the middle the night and was straining to have bowel movement due to constipation.  Patient states that when he got up from the toilet started walking back to the bed he started getting dizzy and felt like he was in a pass out.  When he got to the bedroom his wife states he passed out onto the bed.  Patient was out for less than 1 minute.  Patient's wife states that she called EMS and upon their arrival started giving him IV fluids.  Patient is feeling no symptoms other than the upper respiratory symptoms here at this time.  The patient denies chest pain, shortness of breath, headache,blurred vision, neck pain,weakness, numbness, dizziness, anorexia, edema, abdominal pain, nausea, vomiting, diarrhea, rash, back pain, dysuria, hematemesis, bloody stool.  Past Medical History:  Diagnosis Date  . Hyperlipidemia   . Hypertension     Patient Active Problem List   Diagnosis Date Noted  . Discoloration of eye 11/23/2016  . Palpitations 11/23/2016  . Dyslipidemia 11/23/2016  . Health care maintenance 10/01/2015  . Right inguinal hernia 10/27/2014  . Hypertriglyceridemia 10/27/2014  . Pain in joint, shoulder region 10/27/2014  . Essential hypertension, benign 08/26/2014  . Dental caries 08/26/2014  . Radicular pain in right arm 10/16/2013  . 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 Seizure 07/19/2013  . Right arm pain 07/19/2013  . Other and unspecified hyperlipidemia 05/06/2013    Past Surgical History:  Procedure Laterality Date  . HERNIA REPAIR          Home Medications    Prior to Admission medications   Medication Sig  Start Date End Date Taking? Authorizing Provider  aspirin EC 81 MG tablet Take 1 tablet (81 mg total) by mouth daily. Patient not taking: Reported on 09/25/2018 01/24/18   Gildardo Pounds, NP  lisinopril-hydrochlorothiazide (PRINZIDE,ZESTORETIC) 20-25 MG tablet Take 1 tablet by mouth daily. 11/20/18   Gildardo Pounds, NP  rosuvastatin (CRESTOR) 20 MG tablet Take 1 tablet (20 mg total) by mouth daily. 11/26/18   Gildardo Pounds, NP  tizanidine (ZANAFLEX) 2 MG capsule Take 1 capsule (2 mg total) by mouth 3 (three) times daily. 11/20/18   Gildardo Pounds, NP    Family History Family History  Problem Relation Age of Onset  . Diabetes Sister     Social History Social History   Tobacco Use  . Smoking status: Never Smoker  . Smokeless tobacco: Never Used  Substance Use Topics  . Alcohol use: Yes    Comment: occasonally  . Drug use: No     Allergies   Patient has no known allergies.   Review of Systems Review of Systems All other systems negative except as documented in the HPI. All pertinent positives and negatives as reviewed in the HPI.  Physical Exam Updated Vital Signs BP 114/76   Pulse (!) 58   Temp (!) 97.5 F (36.4 C) (Oral)   Resp 20   Ht 5\' 6"  (1.676 m)   Wt 77.6 kg   SpO2 100%   BMI 27.61 kg/m   Physical Exam Vitals signs and nursing note  reviewed.  Constitutional:      General: He is not in acute distress.    Appearance: He is well-developed.  HENT:     Head: Normocephalic and atraumatic.     Right Ear: Tympanic membrane normal.     Left Ear: Tympanic membrane normal.     Nose: Congestion and rhinorrhea present.     Mouth/Throat:     Mouth: Mucous membranes are moist.  Eyes:     Pupils: Pupils are equal, round, and reactive to light.  Neck:     Musculoskeletal: Normal range of motion and neck supple.  Cardiovascular:     Rate and Rhythm: Normal rate and regular rhythm.     Heart sounds: Normal heart sounds. No murmur. No friction rub. No gallop.     Pulmonary:     Effort: Pulmonary effort is normal. No respiratory distress.     Breath sounds: Normal breath sounds. No stridor. No wheezing or rhonchi.  Abdominal:     General: Bowel sounds are normal. There is no distension.     Palpations: Abdomen is soft.     Tenderness: There is no abdominal tenderness. There is no guarding or rebound.     Hernia: No hernia is present.  Musculoskeletal:        General: No swelling.     Right lower leg: No edema.     Left lower leg: No edema.  Skin:    General: Skin is warm and dry.     Capillary Refill: Capillary refill takes less than 2 seconds.     Findings: No erythema or rash.  Neurological:     Mental Status: He is alert and oriented to person, place, and time.     Motor: No weakness or abnormal muscle tone.     Coordination: Coordination normal.     Gait: Gait normal.     Deep Tendon Reflexes: Reflexes normal.  Psychiatric:        Behavior: Behavior normal.      ED Treatments / Results  Labs (all labs ordered are listed, but only abnormal results are displayed) Labs Reviewed  BASIC METABOLIC PANEL - Abnormal; Notable for the following components:      Result Value   CO2 21 (*)    Glucose, Bld 130 (*)    All other components within normal limits  CBC - Abnormal; Notable for the following components:   Hemoglobin 17.4 (*)    MCHC 36.4 (*)    All other components within normal limits  URINALYSIS, ROUTINE W REFLEX MICROSCOPIC  CBG MONITORING, ED    EKG None  Radiology No results found.  Procedures Procedures (including critical care time)  Medications Ordered in ED Medications  sodium chloride flush (NS) 0.9 % injection 3 mL (3 mLs Intravenous Given 12/04/18 0815)  sodium chloride 0.9 % bolus 1,000 mL (1,000 mLs Intravenous New Bag/Given 12/04/18 0846)     Initial Impression / Assessment and Plan / ED Course  I have reviewed the triage vital signs and the nursing notes.  Pertinent labs & imaging results that were  available during my care of the patient were reviewed by me and considered in my medical decision making (see chart for details).     Feel that the patient had a vasovagal event that is multifactorial.  I feel that the straining while having a bowel movement along with dehydration from his influenza-like illness was the cause.  The patient is feeling better and has ambulated without significant problems here  in the emergency department.  The patient states he did not has any symptoms.  His heart rate is been stable along with his blood pressures.  Final Clinical Impressions(s) / ED Diagnoses   Final diagnoses:  None    ED Discharge Orders    None       Dalia Heading, PA-C 12/09/18 1652    Carmin Muskrat, MD 12/09/18 2349

## 2018-12-05 MED FILL — LISINOPRIL-HCTZ 20-25 MG TA: 20-25 | 30 days supply | Qty: 30 | Fill #0 | Status: TO

## 2018-12-05 MED FILL — ROSUVASTATIN CALCIUM 20 MG: 20 | 30 days supply | Qty: 30 | Fill #0 | Status: TO

## 2019-02-19 ENCOUNTER — Telehealth: Payer: Self-pay | Admitting: Nurse Practitioner

## 2019-02-19 NOTE — Telephone Encounter (Signed)
1) Medication(s) Requested (by name): rosuvastatin (CRESTOR) 20 MG tablet  lisinopril-hydrochlorothiazide (PRINZIDE,ZESTORETIC) 20-25 MG tablet   2) Pharmacy of Choice: [facility] on [address]   3) Special Requests: Pt wants to have meds transferred over to [place]  Approved medications will be sent to the pharmacy, we will reach out if there is an issue.  Requests made after 3pm may not be addressed until the following business day!  If a patient is unsure of the name of the medication(s) please note and ask patient to call back when they are able to provide all info, do not send to responsible party until all information is available!

## 2019-02-19 NOTE — Telephone Encounter (Signed)
Pt needs to request the transfer from [facility], they will handle it for him.

## 2019-02-21 MED FILL — ROSUVASTATIN CALCIUM 20 MG: 20 | 30 days supply | Qty: 30 | Fill #1 | Status: TO

## 2019-02-21 MED FILL — LISINOPRIL-HCTZ 20-25 MG TA: 20-25 | 30 days supply | Qty: 30 | Fill #1 | Status: TO

## 2019-03-27 MED FILL — ?ROSUVASTATIN CALCIUM 20MG: 20 | 30 days supply | Qty: 30 | Fill #2 | Status: TO

## 2019-03-27 MED FILL — LISINOPRIL-HCTZ 20-25 MG TA: 20-25 | 30 days supply | Qty: 30 | Fill #2 | Status: TO

## 2019-04-19 ENCOUNTER — Telehealth: Payer: Self-pay | Admitting: Nurse Practitioner

## 2019-04-19 NOTE — Telephone Encounter (Signed)
Patients call returned.  Patient identified by name and date of birth.  Patient states that he is feeling weak and dizzy for the past two days. Patient has hypertension but he has a machine to monitor blood pressure and I has marked normal pressure.   Patient is not working this weekend and was advised to relax and take it easy, drink plenty of water and follow a nutrition diet and keep his appointment on Monday.  Patient acknowledged understanding of advice.

## 2019-04-19 NOTE — Telephone Encounter (Signed)
Patient wife called to inform patient was feeling dizzy and weak. Please follow upo

## 2019-04-22 ENCOUNTER — Other Ambulatory Visit: Payer: Self-pay

## 2019-04-22 ENCOUNTER — Encounter: Payer: Self-pay | Admitting: Nurse Practitioner

## 2019-04-22 ENCOUNTER — Ambulatory Visit: Payer: Self-pay | Attending: Nurse Practitioner | Admitting: Nurse Practitioner

## 2019-04-22 ENCOUNTER — Ambulatory Visit: Payer: Self-pay | Admitting: Nurse Practitioner

## 2019-04-22 DIAGNOSIS — R42 Dizziness and giddiness: Secondary | ICD-10-CM

## 2019-04-22 DIAGNOSIS — I10 Essential (primary) hypertension: Secondary | ICD-10-CM

## 2019-04-22 MED ORDER — AMLODIPINE BESYLATE 5 MG PO TABS: 5.0000 mg | ORAL_TABLET | Freq: Every day | ORAL | 3 refills | Status: DC

## 2019-04-22 MED ORDER — MECLIZINE HCL 25 MG PO TABS: 25.0000 mg | ORAL_TABLET | Freq: Three times a day (TID) | ORAL | 0 refills | Status: DC | PRN

## 2019-04-22 MED FILL — MECLIZINE 25 MG TABLET: 25 | 20 days supply | Qty: 60 | Fill #0

## 2019-04-22 MED FILL — AMLODIPINE BESYLATE 5 MG TA: 5 | 30 days supply | Qty: 30 | Fill #0

## 2019-04-22 NOTE — Progress Notes (Signed)
Virtual Visit via Telephone Note Due to national recommendations of social distancing due to [place] 19, telehealth visit is felt to be most appropriate for this patient at this time.  I discussed the limitations, risks, security and privacy concerns of performing an evaluation and management service by telephone and the availability of in person appointments. I also discussed with the patient that there may be a patient responsible charge related to this service. The patient expressed understanding and agreed to proceed.    I connected with Shawn Porter on [birth year]  at   8:30 AM EDT  EDT by telephone and verified that I am speaking with the correct person using two identifiers.   Consent I discussed the limitations, risks, security and privacy concerns of performing an evaluation and management service by telephone and the availability of in person appointments. I also discussed with the patient that there may be a patient responsible charge related to this service. The patient expressed understanding and agreed to proceed.   Location of Patient: Private  Residence    Location of Provider: [place] and [place] Office    Persons participating in Telemedicine visit: Shawn Rankins FNP-BC [place]  [place] Effie Shy [place] #[id number]   History of Present Illness: Telemedicine visit for: Nocturnal Dizziness  Vertigo - Dizziness Patient presents with dizziness .  The dizziness has been present on and off for 4 months. The patient describes the symptoms as lightheadedness and head swimming. Symptoms are exacerbated by rapid head movements, rolling over in bed and rising from supine position The patient also complains of none. Patient denies none any sinus or allergy symptoms.  He has been treated with nothing.   Previous work up has been NONE. He takes lisinopril-hctz 20-25 mg at night. Reports dizziness worse at night. Sometimes occurring during the day but  mostly at night. Will stop current BP med and switch to amlodipine.   BP Readings from Last 3 Encounters:  12/04/18 114/76  11/20/18 113/71  09/25/18 133/81    Past Medical History:  Diagnosis Date  . Hyperlipidemia   . Hypertension     Past Surgical History:  Procedure Laterality Date  . HERNIA REPAIR      Family History  Problem Relation Age of Onset  . Diabetes Sister     Social History   Socioeconomic History  . Marital status: Married    Spouse name: Not on file  . Number of children: Not on file  . Years of education: Not on file  . Highest education level: Not on file  Occupational History  . Not on file  Social Needs  . Financial resource strain: Not on file  . Food insecurity    Worry: Not on file    Inability: Not on file  . Transportation needs    Medical: Not on file    Non-medical: Not on file  Tobacco Use  . Smoking status: Never Smoker  . Smokeless tobacco: Never Used  Substance and Sexual Activity  . Alcohol use: Yes    Comment: occasonally  . Drug use: No  . Sexual activity: Yes  Lifestyle  . Physical activity    Days per week: Not on file    Minutes per session: Not on file  . Stress: Not on file  Relationships  . Social Herbalist on phone: Not on file    Gets together: Not on file    Attends religious service: Not on file  Active member of club or organization: Not on file    Attends meetings of clubs or organizations: Not on file    Relationship status: Not on file  Other Topics Concern  . Not on file  Social History Narrative  . Not on file     Observations/Objective: Awake, alert and oriented x 3   Review of Systems  Constitutional: Negative for fever, malaise/fatigue and weight loss.  HENT: Negative.  Negative for nosebleeds.   Eyes: Negative.  Negative for blurred vision, double vision and photophobia.  Respiratory: Negative.  Negative for cough and shortness of breath.   Cardiovascular: Negative.  Negative  for chest pain, palpitations and leg swelling.  Gastrointestinal: Negative.  Negative for heartburn, nausea and vomiting.  Musculoskeletal: Negative.  Negative for myalgias.  Neurological: Positive for dizziness. Negative for focal weakness, seizures and headaches.  Psychiatric/Behavioral: Negative.  Negative for suicidal ideas.    Assessment and Plan: Shawn Porter was seen today for dizziness.  Diagnoses and all orders for this visit:  Dizzinesses -     meclizine (ANTIVERT) 25 MG tablet; Take 1 tablet (25 mg total) by mouth 3 (three) times daily as needed for dizziness.  Essential hypertension -     amLODipine (NORVASC) 5 MG tablet; Take 1 tablet (5 mg total) by mouth daily.     Follow Up Instructions Return in about 2 weeks (around 05/06/2019) for BP, labs, dizziness.     I discussed the assessment and treatment plan with the patient. The patient was provided an opportunity to ask questions and all were answered. The patient agreed with the plan and demonstrated an understanding of the instructions.   The patient was advised to call back or seek an in-person evaluation if the symptoms worsen or if the condition fails to improve as anticipated.  I provided 27 minutes of non-face-to-face time during this encounter including median intraservice time, reviewing previous notes, labs, imaging, medications and explaining diagnosis and management.  Gildardo Pounds, FNP-BC

## 2019-04-25 ENCOUNTER — Telehealth: Payer: Self-pay | Admitting: Nurse Practitioner

## 2019-04-25 NOTE — Telephone Encounter (Signed)
Pt's wife called stating patient is still reporting feeling dizzy, she states last time this happened he fainted. She verified that patient is taking new medications amLODipine and meclizine since 04/22/19. CMA Bien advised to continue medications as instructed by provider until his follow up appointment in 2 weeks. She is aware that if his symptoms worsen to go to the nearest Urgent care or ER. She had no additional questions

## 2019-04-25 NOTE — Telephone Encounter (Signed)
Noted. Thank You.

## 2019-05-06 ENCOUNTER — Ambulatory Visit: Payer: Self-pay | Attending: Nurse Practitioner | Admitting: Nurse Practitioner

## 2019-05-06 ENCOUNTER — Encounter: Payer: Self-pay | Admitting: Nurse Practitioner

## 2019-05-06 ENCOUNTER — Other Ambulatory Visit: Payer: Self-pay

## 2019-05-06 VITALS — BP 126/79 | HR 64 | Temp 98.4°F | Ht 66.0 in | Wt 174.2 lb

## 2019-05-06 DIAGNOSIS — E785 Hyperlipidemia, unspecified: Secondary | ICD-10-CM

## 2019-05-06 DIAGNOSIS — I10 Essential (primary) hypertension: Secondary | ICD-10-CM

## 2019-05-06 MED ORDER — ROSUVASTATIN CALCIUM 20 MG PO TABS: 20.0000 mg | ORAL_TABLET | Freq: Every day | ORAL | 3 refills | Status: DC

## 2019-05-06 MED FILL — ?ROSUVASTATIN CALCIUM 20MG: 20 | 30 days supply | Qty: 30 | Fill #0

## 2019-05-06 NOTE — Progress Notes (Signed)
Assessment & Plan:  Shawn Porter was seen today for hypertension.  Diagnoses and all orders for this visit:  Essential hypertension -     CBC -     CMP14+EGFR Continue AMLODIPINE as prescribed.  Remember to bring in your blood pressure log with you for your follow up appointment.  DASH/Mediterranean Diets are healthier choices for HTN.    Dyslipidemia -     Lipid panel -     rosuvastatin (CRESTOR) 20 MG tablet; Take 1 tablet (20 mg total) by mouth daily. INSTRUCTIONS: Work on a low fat, heart healthy diet and participate in regular aerobic exercise program by working out at least 150 minutes per week; 5 days a week-30 minutes per day. Avoid red meat, fried foods. junk foods, sodas, sugary drinks, unhealthy snacking, alcohol and smoking.  Drink at least 48oz of water per day and monitor your carbohydrate intake daily.      Patient has been counseled on age-appropriate routine health concerns for screening and prevention. These are reviewed and up-to-date. Referrals have been placed accordingly. Immunizations are up-to-date or declined.      VRI was used to communicate directly with patient for the entire encounter including providing detailed patient instructions.  Subjective:   Chief Complaint  Patient presents with  . Hypertension    Pt. is here for HTN. Pt. stated he no longer feel dizzy.     [name] 48 y.o. male presents to office today for HTN. His lisinopril-hctz 20-25 was switched to amlodipine 38m a few weeks ago due to complaints of dizziness. He was also prescribed meclizine for his symptoms. He did not take the meclizine but states the dizziness has completely resolved.  Essential Hypertension Chronic and well controlled taking amlodipine 5 mg daily as prescribed. Denies chest pain, shortness of breath, palpitations, lightheadedness, dizziness, headaches or BLE edema.  BP Readings from Last 3 Encounters:  05/06/19 126/79  12/04/18 114/76  11/20/18  113/71   Dyslipidemia Taking Rosuvastatin as prescribed. Denies any statin intolerance or myalgias. Lipids levels not at goal.  Lab Results  Component Value Date   CHOL 196 11/20/2018   CHOL 226 (H) 10/01/2015   CHOL 198 02/24/2015   Lab Results  Component Value Date   HDL 39 (L) 11/20/2018   HDL 37 (L) 10/01/2015   HDL 37 (L) 02/24/2015   Lab Results  Component Value Date   LDLCALC Comment 11/20/2018   LDLCALC 143 (H) 10/01/2015   LDLCALC 111 (H) 02/24/2015   Lab Results  Component Value Date   TRIG 445 (H) 11/20/2018   TRIG 229 (H) 10/01/2015   TRIG 248 (H) 02/24/2015   Lab Results  Component Value Date   CHOLHDL 5.0 11/20/2018   CHOLHDL 6.1 (H) 10/01/2015   CHOLHDL 5.4 02/24/2015   Review of Systems  Constitutional: Negative for fever, malaise/fatigue and weight loss.  HENT: Negative.  Negative for nosebleeds.   Eyes: Negative.  Negative for blurred vision, double vision and photophobia.  Respiratory: Negative.  Negative for cough and shortness of breath.   Cardiovascular: Negative.  Negative for chest pain, palpitations and leg swelling.  Gastrointestinal: Negative.  Negative for heartburn, nausea and vomiting.  Musculoskeletal: Negative.  Negative for myalgias.  Neurological: Negative.  Negative for dizziness, focal weakness, seizures and headaches.  Psychiatric/Behavioral: Negative.  Negative for suicidal ideas.    Past Medical History:  Diagnosis Date  . Hyperlipidemia   . Hypertension     Past Surgical History:  Procedure Laterality Date  .  HERNIA REPAIR      Family History  Problem Relation Age of Onset  . Diabetes Sister     Social History Reviewed with no changes to be made today.   Outpatient Medications Prior to Visit  Medication Sig Dispense Refill  . amLODipine (NORVASC) 5 MG tablet Take 1 tablet (5 mg total) by mouth daily. 90 tablet 3  . meclizine (ANTIVERT) 25 MG tablet Take 1 tablet (25 mg total) by mouth 3 (three) times daily as  needed for dizziness. 60 tablet 0  . rosuvastatin (CRESTOR) 20 MG tablet Take 1 tablet (20 mg total) by mouth daily. 90 tablet 3  . aspirin EC 81 MG tablet Take 1 tablet (81 mg total) by mouth daily. (Patient not taking: Reported on 09/25/2018) 100 tablet 1  . tizanidine (ZANAFLEX) 2 MG capsule Take 1 capsule (2 mg total) by mouth 3 (three) times daily. (Patient not taking: Reported on 04/22/2019) 30 capsule 1   No facility-administered medications prior to visit.     No Known Allergies     Objective:    BP 126/79 (BP Location: Left Arm, Patient Position: Sitting, Cuff Size: Normal)   Pulse 64   Temp 98.4 F (36.9 C) (Oral)   Ht _0  (1.676 m)   Wt 174 lb 3.2 oz (79 kg)   SpO2 99%   BMI 28.12 kg/m  Wt Readings from Last 3 Encounters:  05/06/19 174 lb 3.2 oz (79 kg)  12/04/18 171 lb 1.2 oz (77.6 kg)  11/20/18 170 lb (77.1 kg)    Physical Exam Vitals signs and nursing note reviewed.  Constitutional:      Appearance: He is well-developed.  HENT:     Head: Normocephalic and atraumatic.  Neck:     Musculoskeletal: Normal range of motion.  Cardiovascular:     Rate and Rhythm: Normal rate and regular rhythm.     Heart sounds: Normal heart sounds. No murmur. No friction rub. No gallop.   Pulmonary:     Effort: Pulmonary effort is normal. No tachypnea or respiratory distress.     Breath sounds: Normal breath sounds. No decreased breath sounds, wheezing, rhonchi or rales.  Chest:     Chest wall: No tenderness.  Abdominal:     General: Bowel sounds are normal.     Palpations: Abdomen is soft.  Musculoskeletal: Normal range of motion.  Skin:    General: Skin is warm and dry.  Neurological:     Mental Status: He is alert and oriented to person, place, and time.     Coordination: Coordination normal.  Psychiatric:        Behavior: Behavior normal. Behavior is cooperative.        Thought Content: Thought content normal.        Judgment: Judgment normal.        Patient  has been counseled extensively about nutrition and exercise as well as the importance of adherence with medications and regular follow-up. The patient was given clear instructions to go to ER or return to medical center if symptoms don't improve, worsen or new problems develop. The patient verbalized understanding.   Follow-up: Return in about 3 months (around 08/06/2019) for HTN.   Gildardo Pounds, FNP-BC [facility] and [place], [place]   05/06/2019, 8:03 PM

## 2019-05-07 LAB — CBC
Hematocrit: 47.8 % (ref 37.5–51.0)
Hemoglobin: 16.6 g/dL (ref 13.0–17.7)
MCH: 32.8 pg (ref 26.6–33.0)
MCHC: 34.7 g/dL (ref 31.5–35.7)
MCV: 95 fL (ref 79–97)
Platelets: 242 10*3/uL (ref 150–450)
RBC: 5.06 x10E6/uL (ref 4.14–5.80)
RDW: 12.9 % (ref 11.6–15.4)
WBC: 5.8 10*3/uL (ref 3.4–10.8)

## 2019-05-07 LAB — CMP14+EGFR
ALT: 30 IU/L (ref 0–44)
AST: 22 IU/L (ref 0–40)
Albumin/Globulin Ratio: 2.3 — ABNORMAL HIGH (ref 1.2–2.2)
Albumin: 4.3 g/dL (ref 4.0–5.0)
Alkaline Phosphatase: 57 IU/L (ref 39–117)
BUN/Creatinine Ratio: 15 (ref 9–20)
BUN: 11 mg/dL (ref 6–24)
Bilirubin Total: 1.1 mg/dL (ref 0.0–1.2)
CO2: 22 mmol/L (ref 20–29)
Calcium: 9.2 mg/dL (ref 8.7–10.2)
Chloride: 107 mmol/L — ABNORMAL HIGH (ref 96–106)
Creatinine, Ser: 0.75 mg/dL — ABNORMAL LOW (ref 0.76–1.27)
GFR calc Af Amer: 125 mL/min/{1.73_m2} (ref >59)
GFR calc non Af Amer: 108 mL/min/{1.73_m2} (ref >59)
Globulin, Total: 1.9 g/dL (ref 1.5–4.5)
Glucose: 99 mg/dL (ref 65–99)
Potassium: 4.3 mmol/L (ref 3.5–5.2)
Sodium: 141 mmol/L (ref 134–144)
Total Protein: 6.2 g/dL (ref 6.0–8.5)

## 2019-05-07 LAB — LIPID PANEL
Chol/HDL Ratio: 4.9 ratio (ref 0.0–5.0)
Cholesterol, Total: 191 mg/dL (ref 100–199)
HDL: 39 mg/dL — ABNORMAL LOW (ref >39)
LDL Calculated: 112 mg/dL — ABNORMAL HIGH (ref 0–99)
Triglycerides: 198 mg/dL — ABNORMAL HIGH (ref 0–149)
VLDL Cholesterol Cal: 40 mg/dL (ref 5–40)

## 2019-05-17 MED FILL — ?AMLODIPINE BESYLATE 5MG TA: 5 | 30 days supply | Qty: 30 | Fill #1

## 2019-06-09 ENCOUNTER — Other Ambulatory Visit: Payer: Self-pay

## 2019-06-09 ENCOUNTER — Emergency Department (HOSPITAL_BASED_OUTPATIENT_CLINIC_OR_DEPARTMENT_OTHER)
Admission: EM | Admit: 2019-06-09 | Discharge: 2019-06-09 | Disposition: A | Discharge status: Home / Self Care | Payer: Self-pay | Attending: Emergency Medicine | Admitting: Emergency Medicine

## 2019-06-09 DIAGNOSIS — Y998 Other external cause status: Secondary | ICD-10-CM | POA: Insufficient documentation

## 2019-06-09 DIAGNOSIS — Y929 Unspecified place or not applicable: Secondary | ICD-10-CM | POA: Insufficient documentation

## 2019-06-09 DIAGNOSIS — I10 Essential (primary) hypertension: Secondary | ICD-10-CM | POA: Insufficient documentation

## 2019-06-09 DIAGNOSIS — Y9389 Activity, other specified: Secondary | ICD-10-CM | POA: Insufficient documentation

## 2019-06-09 DIAGNOSIS — X58XXXA Exposure to other specified factors, initial encounter: Secondary | ICD-10-CM | POA: Insufficient documentation

## 2019-06-09 DIAGNOSIS — T162XXA Foreign body in left ear, initial encounter: Secondary | ICD-10-CM | POA: Insufficient documentation

## 2019-06-09 NOTE — ED Triage Notes (Signed)
LEft ear with insect, pt c/o severe pain.

## 2019-06-09 NOTE — ED Provider Notes (Signed)
Emergency Department Provider Note   I have reviewed the triage vital signs and the nursing notes.   HISTORY  Chief Complaint Foreign Body in Ear (insect)   [name] is a 48 y.o. male who presents the emergency department today secondary to foreign body in his left ear.  Uncomfortable.  He is unsure what the insect is.  No other complaints.   No other associated or modifying symptoms.    Past Medical History:  Diagnosis Date  . Hyperlipidemia   . Hypertension     Patient Active Problem List   Diagnosis Date Noted  . Discoloration of eye 11/23/2016  . Palpitations 11/23/2016  . Dyslipidemia 11/23/2016  . Health care maintenance 10/01/2015  . Right inguinal hernia 10/27/2014  . Hypertriglyceridemia 10/27/2014  . Pain in joint, shoulder region 10/27/2014  . Essential hypertension, benign 08/26/2014  . Dental caries 08/26/2014  . Radicular pain in right arm 10/16/2013  . Right arm pain 07/19/2013  . Other and unspecified hyperlipidemia 05/06/2013    Past Surgical History:  Procedure Laterality Date  . HERNIA REPAIR      Current Outpatient Rx  . Order #: [id number] Class: Normal  . Order #: [id number] Class: Normal    Allergies Patient has no known allergies.  Family History  Problem Relation Age of Onset  . Diabetes Sister     Social History Social History   Tobacco Use  . Smoking status: Never Smoker  . Smokeless tobacco: Never Used  Substance Use Topics  . Alcohol use: Yes    Comment: occasonally  . Drug use: No    Review of Systems  All other systems negative except as documented in the HPI. All pertinent positives and negatives as reviewed in the HPI. ____________________________________________   PHYSICAL EXAM:  VITAL SIGNS: ED Triage Vitals  Enc Vitals Group     BP 06/09/19 0007 (!) 158/101     Pulse Rate 06/09/19 0007 64     Resp 06/09/19 0007 18     Temp 06/09/19 0007 97.9 F (36.6 C)     Temp Source 06/09/19 0007  Oral     SpO2 06/09/19 0007 99 %     Weight --      Height --      Head Circumference --      Peak Flow --      Pain Score 06/09/19 0008 9     Pain Loc --      Pain Edu? --      Excl. in [place]? --     Constitutional: Alert and oriented. Well appearing and in no acute distress. Eyes: Conjunctivae are normal. PERRL. EOMI. Ears: L ear visualized live winged insect. Did not inspect right ear.  Head: Atraumatic. Nose: No congestion/rhinnorhea. Mouth/Throat: Mucous membranes are moist.  Oropharynx non-erythematous. Neck: No stridor.  No meningeal signs.   Cardiovascular: Normal rate, regular rhythm. Good peripheral circulation. Grossly normal heart sounds.   Respiratory: Normal respiratory effort.  No retractions. Lungs CTAB. Gastrointestinal: Soft and nontender. No distention.  Musculoskeletal: No lower extremity tenderness nor edema. No gross deformities of extremities. Neurologic:  Normal speech and language. No gross focal neurologic deficits are appreciated.  Skin:  Skin is warm, dry and intact. No rash noted.   ____________________________________________   PROCEDURES  Procedure(s) performed:   .Foreign Body Removal  Date/Time: 06/09/2019 12:30 AM Performed by: Merrily Pew, MD Authorized by: Merrily Pew, MD  Consent: Verbal consent obtained. Risks and benefits: risks, benefits and alternatives were  discussed Required items: required blood products, implants, devices, and special equipment available Patient identity confirmed: verbally with patient and arm band Time out: Immediately prior to procedure a "time out" was called to verify the correct patient, procedure, equipment, support staff and site/side marked as required. Body area: ear Location details: left ear Anesthesia method: none.  Sedation: Patient sedated: no  Patient restrained: no Patient cooperative: yes Localization method: ENT speculum and visualized Removal mechanism: pickups. Complexity: simple 1  objects recovered. Objects recovered: live winged insect Post-procedure assessment: foreign body removed Patient tolerance: patient tolerated the procedure well with no immediate complications Comments: No e/o tympanic membrane or EAC damage after insect removed.     ____________________________________________   INITIAL IMPRESSION / ASSESSMENT AND PLAN / ED COURSE  Removed insect without evident complication.      Pertinent labs & imaging results that were available during my care of the patient were reviewed by me and considered in my medical decision making (see chart for details).  A medical screening exam was performed and I feel the patient has had an appropriate workup for their chief complaint at this time and likelihood of emergent condition existing is low. They have been counseled on decision, discharge, follow up and which symptoms necessitate immediate return to the emergency department. They or their family verbally stated understanding and agreement with plan and discharged in stable condition.   ____________________________________________  FINAL CLINICAL IMPRESSION(S) / ED DIAGNOSES  Final diagnoses:  Foreign body of left ear, initial encounter     MEDICATIONS GIVEN DURING THIS VISIT:  Medications - No data to display   NEW OUTPATIENT MEDICATIONS STARTED DURING THIS VISIT:  Current Discharge Medication List      Note:  This note was prepared with assistance of Dragon voice recognition software. Occasional wrong-word or sound-a-like substitutions may have occurred due to the inherent limitations of voice recognition software.   Kooper Chriswell, Corene Cornea, MD 06/09/19 [phone]

## 2019-06-11 MED FILL — ?ROSUVASTATIN CALCIUM 20MG: 20 | 30 days supply | Qty: 30 | Fill #1

## 2019-07-11 MED FILL — ROSUVASTATIN CALCIUM 20 MG: 20 | 30 days supply | Qty: 30 | Fill #2

## 2019-07-11 MED FILL — ?AMLODIPINE BESYLATE 5MG TA: 5 | 30 days supply | Qty: 30 | Fill #2

## 2019-08-16 MED FILL — ROSUVASTATIN CALCIUM 20 MG: 20 | 30 days supply | Qty: 30 | Fill #3

## 2019-08-16 MED FILL — ?AMLODIPINE BESYLATE 5MG TA: 5 | 30 days supply | Qty: 30 | Fill #3

## 2019-08-28 ENCOUNTER — Ambulatory Visit: Payer: Self-pay

## 2019-10-10 MED FILL — ?AMLODIPINE BESYLATE 5 MG T: 5 MG | 30 days supply | Qty: 30 | Fill #4

## 2019-10-10 MED FILL — ?ROSUVASTATIN CALCIUM 20MG: 20 | 30 days supply | Qty: 30 | Fill #4

## 2019-11-21 MED FILL — ROSUVASTATIN CALCIUM 20 MG: 20 | 30 days supply | Qty: 30 | Fill #0

## 2019-11-21 MED FILL — AMLODIPINE BESYLATE 5 MG TA: 5 | 30 days supply | Qty: 30 | Fill #5

## 2019-12-25 MED FILL — AMLODIPINE BESYLATE 5 MG TA: 5 | 30 days supply | Qty: 30 | Fill #6

## 2019-12-26 ENCOUNTER — Ambulatory Visit: Payer: Self-pay | Attending: Nurse Practitioner

## 2019-12-26 ENCOUNTER — Other Ambulatory Visit: Payer: Self-pay

## 2019-12-26 MED FILL — ?ROSUVASTATIN CALC 20MG TA: 20 | 30 days supply | Qty: 30 | Fill #5

## 2020-01-01 ENCOUNTER — Encounter: Payer: Self-pay | Admitting: Nurse Practitioner

## 2020-01-01 ENCOUNTER — Other Ambulatory Visit: Payer: Self-pay

## 2020-01-01 ENCOUNTER — Other Ambulatory Visit: Payer: Self-pay | Admitting: Nurse Practitioner

## 2020-01-01 ENCOUNTER — Ambulatory Visit: Payer: Self-pay | Attending: Nurse Practitioner | Admitting: Nurse Practitioner

## 2020-01-01 DIAGNOSIS — Z130 Encounter for screening for diseases of the blood and blood-forming organs and certain disorders involving the immune mechanism: Secondary | ICD-10-CM

## 2020-01-01 DIAGNOSIS — I10 Essential (primary) hypertension: Secondary | ICD-10-CM

## 2020-01-01 DIAGNOSIS — Z131 Encounter for screening for diabetes mellitus: Secondary | ICD-10-CM

## 2020-01-01 DIAGNOSIS — E785 Hyperlipidemia, unspecified: Secondary | ICD-10-CM

## 2020-01-01 MED ORDER — ROSUVASTATIN CALCIUM 20 MG PO TABS: 20.0000 mg | ORAL_TABLET | Freq: Every day | ORAL | 3 refills | Status: DC

## 2020-01-01 MED ORDER — AMLODIPINE BESYLATE 5 MG PO TABS: 5.0000 mg | ORAL_TABLET | Freq: Every day | ORAL | 3 refills | Status: DC

## 2020-01-01 NOTE — Progress Notes (Signed)
Virtual Visit via Telephone Note Due to national recommendations of social distancing due to [place] 19, telehealth visit is felt to be most appropriate for this patient at this time.  I discussed the limitations, risks, security and privacy concerns of performing an evaluation and management service by telephone and the availability of in person appointments. I also discussed with the patient that there may be a patient responsible charge related to this service. The patient expressed understanding and agreed to proceed.    I connected with Shawn Porter on [id number]  at   8:30 AM EST  EDT by telephone and verified that I am speaking with the correct person using two identifiers.   Consent I discussed the limitations, risks, security and privacy concerns of performing an evaluation and management service by telephone and the availability of in person appointments. I also discussed with the patient that there may be a patient responsible charge related to this service. The patient expressed understanding and agreed to proceed.   Location of Patient: Private Residence    Location of Provider: [place] and [place] Office    Persons participating in Telemedicine visit: Shawn Rankins FNP-BC [place]  Spanish Interpreter [place] [id number]   History of Present Illness: Telemedicine visit for: Follow up  has a past medical history of Hyperlipidemia and Hypertension.   Essential Hypertension He does not monitor his blood pressure at home. Denies chest pain, shortness of breath, palpitations, lightheadedness, dizziness, headaches or BLE edema. Taking amlodipine 5 mg daily as prescribed.  BP Readings from Last 3 Encounters:  06/09/19 (!) 158/101  05/06/19 126/79  12/04/18 114/76   Hyperlipidemia LDL not at goal of <100. He endorses medication compliance taking crestor 20 mg daily.  Lab Results  Component Value Date   LDLCALC 112 (H) 05/06/2019  The  10-year ASCVD risk score Mikey Bussing DC Brooke Bonito., et al., 2013) is: 6.5%   Values used to calculate the score:     Age: 49 years     Sex: Male     Is Non-Hispanic African American: No     Diabetic: No     Tobacco smoker: No     Systolic Blood Pressure: 0000000 mmHg     Is BP treated: Yes     HDL Cholesterol: 39 mg/dL     Total Cholesterol: 191 mg/dL  Past Medical History:  Diagnosis Date  . Hyperlipidemia   . Hypertension     Past Surgical History:  Procedure Laterality Date  . HERNIA REPAIR      Family History  Problem Relation Age of Onset  . Diabetes Sister     Social History   Socioeconomic History  . Marital status: Married    Spouse name: Not on file  . Number of children: Not on file  . Years of education: Not on file  . Highest education level: Not on file  Occupational History  . Not on file  Tobacco Use  . Smoking status: Never Smoker  . Smokeless tobacco: Never Used  Substance and Sexual Activity  . Alcohol use: Yes    Comment: occasonally  . Drug use: No  . Sexual activity: Yes  Other Topics Concern  . Not on file  Social History Narrative  . Not on file   Social Determinants of Health   Financial Resource Strain:   . Difficulty of Paying Living Expenses: Not on file  Food Insecurity:   . Worried About Charity fundraiser in the Last  Year: Not on file  . Ran Out of Food in the Last Year: Not on file  Transportation Needs:   . Lack of Transportation (Medical): Not on file  . Lack of Transportation (Non-Medical): Not on file  Physical Activity:   . Days of Exercise per Week: Not on file  . Minutes of Exercise per Session: Not on file  Stress:   . Feeling of Stress : Not on file  Social Connections:   . Frequency of Communication with Friends and Family: Not on file  . Frequency of Social Gatherings with Friends and Family: Not on file  . Attends Religious Services: Not on file  . Active Member of Clubs or Organizations: Not on file  . Attends Theatre manager Meetings: Not on file  . Marital Status: Not on file     Observations/Objective: Awake, alert and oriented x 3   Review of Systems  Constitutional: Negative for fever, malaise/fatigue and weight loss.  HENT: Negative.  Negative for nosebleeds.   Eyes: Negative.  Negative for blurred vision, double vision and photophobia.  Respiratory: Negative.  Negative for cough and shortness of breath.   Cardiovascular: Negative.  Negative for chest pain, palpitations and leg swelling.  Gastrointestinal: Negative.  Negative for heartburn, nausea and vomiting.  Musculoskeletal: Negative.  Negative for myalgias.  Neurological: Negative.  Negative for dizziness, focal weakness, seizures and headaches.  Psychiatric/Behavioral: Negative.  Negative for suicidal ideas.    Assessment and Plan: Dolly was seen today for medication refill.  Diagnoses and all orders for this visit:  Essential hypertension -     amLODipine (NORVASC) 5 MG tablet; Take 1 tablet (5 mg total) by mouth daily. Continue all antihypertensives as prescribed.  Remember to bring in your blood pressure log with you for your follow up appointment.  DASH/Mediterranean Diets are healthier choices for HTN.    Dyslipidemia -     rosuvastatin (CRESTOR) 20 MG tablet; Take 1 tablet (20 mg total) by mouth daily. INSTRUCTIONS: Work on a low fat, heart healthy diet and participate in regular aerobic exercise program by working out at least 150 minutes per week; 5 days a week-30 minutes per day. Avoid red meat/beef/steak,  fried foods. junk foods, sodas, sugary drinks, unhealthy snacking, alcohol and smoking.  Drink at least 80 oz of water per day and monitor your carbohydrate intake daily.       Follow Up Instructions Return in about 3 months (around 04/02/2020) for HTN/HPL.     I discussed the assessment and treatment plan with the patient. The patient was provided an opportunity to ask questions and all were answered. The  patient agreed with the plan and demonstrated an understanding of the instructions.   The patient was advised to call back or seek an in-person evaluation if the symptoms worsen or if the condition fails to improve as anticipated.  I provided 14 minutes of non-face-to-face time during this encounter including median intraservice time, reviewing previous notes, labs, imaging, medications and explaining diagnosis and management.  Gildardo Pounds, FNP-BC

## 2020-01-08 ENCOUNTER — Other Ambulatory Visit: Payer: Self-pay

## 2020-01-08 ENCOUNTER — Ambulatory Visit: Payer: Self-pay | Attending: Nurse Practitioner

## 2020-01-08 DIAGNOSIS — Z131 Encounter for screening for diabetes mellitus: Secondary | ICD-10-CM

## 2020-01-08 DIAGNOSIS — E785 Hyperlipidemia, unspecified: Secondary | ICD-10-CM

## 2020-01-08 DIAGNOSIS — I10 Essential (primary) hypertension: Secondary | ICD-10-CM

## 2020-01-08 DIAGNOSIS — Z130 Encounter for screening for diseases of the blood and blood-forming organs and certain disorders involving the immune mechanism: Secondary | ICD-10-CM

## 2020-01-09 LAB — LIPID PANEL
Chol/HDL Ratio: 4 ratio (ref 0.0–5.0)
Cholesterol, Total: 169 mg/dL (ref 100–199)
HDL: 42 mg/dL (ref >39)
LDL Chol Calc (NIH): 105 mg/dL — ABNORMAL HIGH (ref 0–99)
Triglycerides: 120 mg/dL (ref 0–149)
VLDL Cholesterol Cal: 22 mg/dL (ref 5–40)

## 2020-01-09 LAB — CMP14+EGFR
ALT: 37 IU/L (ref 0–44)
AST: 28 IU/L (ref 0–40)
Albumin/Globulin Ratio: 2.1 (ref 1.2–2.2)
Albumin: 4.6 g/dL (ref 4.0–5.0)
Alkaline Phosphatase: 70 IU/L (ref 39–117)
BUN/Creatinine Ratio: 19 (ref 9–20)
BUN: 14 mg/dL (ref 6–24)
Bilirubin Total: 0.9 mg/dL (ref 0.0–1.2)
CO2: 22 mmol/L (ref 20–29)
Calcium: 9.7 mg/dL (ref 8.7–10.2)
Chloride: 105 mmol/L (ref 96–106)
Creatinine, Ser: 0.72 mg/dL — ABNORMAL LOW (ref 0.76–1.27)
GFR calc Af Amer: 127 mL/min/{1.73_m2} (ref >59)
GFR calc non Af Amer: 110 mL/min/{1.73_m2} (ref >59)
Globulin, Total: 2.2 g/dL (ref 1.5–4.5)
Glucose: 94 mg/dL (ref 65–99)
Potassium: 4.7 mmol/L (ref 3.5–5.2)
Sodium: 141 mmol/L (ref 134–144)
Total Protein: 6.8 g/dL (ref 6.0–8.5)

## 2020-01-09 LAB — CBC
Hematocrit: 50.2 % (ref 37.5–51.0)
Hemoglobin: 17.7 g/dL (ref 13.0–17.7)
MCH: 32.2 pg (ref 26.6–33.0)
MCHC: 35.3 g/dL (ref 31.5–35.7)
MCV: 91 fL (ref 79–97)
Platelets: 288 10*3/uL (ref 150–450)
RBC: 5.5 x10E6/uL (ref 4.14–5.80)
RDW: 12.9 % (ref 11.6–15.4)
WBC: 6.9 10*3/uL (ref 3.4–10.8)

## 2020-01-09 LAB — HEMOGLOBIN A1C
Est. average glucose Bld gHb Est-mCnc: 100 mg/dL
Hgb A1c MFr Bld: 5.1 % (ref 4.8–5.6)

## 2020-01-28 MED FILL — AMLODIPINE BESYLATE 5 MG TA: 5 | 30 days supply | Qty: 30 | Fill #7

## 2020-01-28 MED FILL — ?ROSUVASTATIN CALCIUM 20MG: 20 | 30 days supply | Qty: 30 | Fill #6

## 2020-02-28 MED FILL — ?AMLODIPINE BESYLATE 5MG TA: 5 | 30 days supply | Qty: 30 | Fill #8

## 2020-02-28 MED FILL — ROSUVASTATIN CALCIUM 20 MG: 20 | 30 days supply | Qty: 30 | Fill #7

## 2020-03-31 MED FILL — ROSUVASTATIN CALCIUM 20 MG: 20 | 30 days supply | Qty: 30 | Fill #8

## 2020-03-31 MED FILL — AMLODIPINE BESYLATE 5 MG TA: 5 | 30 days supply | Qty: 30 | Fill #9

## 2020-04-09 MED FILL — ?AMLODIPINE BESYLATE 5MG TA: 5 | 30 days supply | Qty: 30 | Fill #9

## 2020-05-19 MED FILL — AMLODIPINE BESYLATE 5 MG TA: 5 | 30 days supply | Qty: 30 | Fill #0

## 2020-05-19 MED FILL — ROSUVASTATIN CALCIUM 20 MG: 20 | 30 days supply | Qty: 30 | Fill #0

## 2020-06-17 ENCOUNTER — Ambulatory Visit: Payer: Self-pay

## 2020-06-23 MED FILL — AMLODIPINE BESYLATE 5 MG TA: 5 | 30 days supply | Qty: 30 | Fill #1

## 2020-06-23 MED FILL — ROSUVASTATIN CALCIUM 20 MG: 20 | 30 days supply | Qty: 30 | Fill #1

## 2020-06-25 ENCOUNTER — Other Ambulatory Visit: Payer: Self-pay

## 2020-06-25 ENCOUNTER — Ambulatory Visit: Payer: Self-pay

## 2020-06-30 ENCOUNTER — Ambulatory Visit (HOSPITAL_COMMUNITY)
Admission: EM | Admit: 2020-06-30 | Discharge: 2020-06-30 | Disposition: A | Discharge status: Home / Self Care | Payer: HRSA Program | Attending: Family Medicine | Admitting: Family Medicine

## 2020-06-30 ENCOUNTER — Encounter (HOSPITAL_COMMUNITY): Payer: Self-pay

## 2020-06-30 ENCOUNTER — Other Ambulatory Visit: Payer: Self-pay

## 2020-06-30 DIAGNOSIS — R05 Cough: Secondary | ICD-10-CM | POA: Diagnosis not present

## 2020-06-30 DIAGNOSIS — J069 Acute upper respiratory infection, unspecified: Secondary | ICD-10-CM | POA: Diagnosis not present

## 2020-06-30 DIAGNOSIS — I10 Essential (primary) hypertension: Secondary | ICD-10-CM | POA: Diagnosis not present

## 2020-06-30 DIAGNOSIS — J029 Acute pharyngitis, unspecified: Secondary | ICD-10-CM | POA: Diagnosis not present

## 2020-06-30 DIAGNOSIS — Z20822 Contact with and (suspected) exposure to covid-19: Secondary | ICD-10-CM | POA: Diagnosis not present

## 2020-06-30 DIAGNOSIS — E785 Hyperlipidemia, unspecified: Secondary | ICD-10-CM | POA: Diagnosis not present

## 2020-06-30 DIAGNOSIS — Z79899 Other long term (current) drug therapy: Secondary | ICD-10-CM | POA: Diagnosis not present

## 2020-06-30 NOTE — ED Triage Notes (Signed)
Pt is here with cough with sore throat that started 2 days ago, pt has not taken any meds to relieve discomfort.

## 2020-06-30 NOTE — Discharge Instructions (Addendum)
Check my chart for your test results May use over the counter cough and cold medicine

## 2020-06-30 NOTE — ED Provider Notes (Signed)
[facility]    CSN: [id number] Arrival date & time: 06/30/20  1337      History   Chief Complaint Chief Complaint  Patient presents with  . Sore Throat    [name] is a 49 y.o. male.   HPI  Here for URI symptoms and sore throat, mild cough no SOB.  NO fever or dhills or HA or boey aches Has not been vaccinated for COVID agrees to testing today and quarantine if needed No known exposure   Past Medical History:  Diagnosis Date  . Hyperlipidemia   . Hypertension     Patient Active Problem List   Diagnosis Date Noted  . Discoloration of eye 11/23/2016  . Palpitations 11/23/2016  . Dyslipidemia 11/23/2016  . Health care maintenance 10/01/2015  . Right inguinal hernia 10/27/2014  . Hypertriglyceridemia 10/27/2014  . Pain in joint, shoulder region 10/27/2014  . Essential hypertension, benign 08/26/2014  . Dental caries 08/26/2014  . Radicular pain in right arm 10/16/2013  . Right arm pain 07/19/2013  . Other and unspecified hyperlipidemia 05/06/2013    Past Surgical History:  Procedure Laterality Date  . HERNIA REPAIR         Home Medications    Prior to Admission medications   Medication Sig Start Date End Date Taking? Authorizing Provider  amLODipine (NORVASC) 5 MG tablet Take 1 tablet (5 mg total) by mouth daily. 01/01/20   Gildardo Pounds, NP  rosuvastatin (CRESTOR) 20 MG tablet Take 1 tablet (20 mg total) by mouth daily. 01/01/20   Gildardo Pounds, NP    Family History Family History  Problem Relation Age of Onset  . Healthy Mother   . Heart failure Father   . Diabetes Sister     Social History Social History   Tobacco Use  . Smoking status: Never Smoker  . Smokeless tobacco: Never Used  Vaping Use  . Vaping Use: Never used  Substance Use Topics  . Alcohol use: Yes    Comment: occasonally  . Drug use: No     Allergies   Other   Review of Systems Review of Systems See HPI  Physical Exam Triage Vital  Signs ED Triage Vitals  Enc Vitals Group     BP 06/30/20 1629 135/84     Pulse Rate 06/30/20 1629 71     Resp 06/30/20 1629 18     Temp 06/30/20 1629 98.2 F (36.8 C)     Temp Source 06/30/20 1629 Oral     SpO2 06/30/20 1629 96 %     Weight --      Height --      Head Circumference --      Peak Flow --      Pain Score 06/30/20 1626 0     Pain Loc --      Pain Edu? --      Excl. in [place]? --    No data found.  Updated Vital Signs BP 135/84 (BP Location: Right Arm)   Pulse 71   Temp 98.2 F (36.8 C) (Oral)   Resp 18   SpO2 96%  :     Physical Exam Constitutional:      General: He is not in acute distress.    Appearance: He is well-developed.  HENT:     Head: Normocephalic and atraumatic.     Mouth/Throat:     Pharynx: Posterior oropharyngeal erythema present.  Eyes:     Conjunctiva/sclera: Conjunctivae normal.  Pupils: Pupils are equal, round, and reactive to light.  Cardiovascular:     Rate and Rhythm: Normal rate and regular rhythm.     Heart sounds: Normal heart sounds.  Pulmonary:     Effort: Pulmonary effort is normal. No respiratory distress.     Breath sounds: Normal breath sounds. No wheezing or rales.  Musculoskeletal:        General: Normal range of motion.     Cervical back: Normal range of motion.  Skin:    General: Skin is warm and dry.  Neurological:     Mental Status: He is alert.  Psychiatric:        Behavior: Behavior normal.      UC Treatments / Results  Labs (all labs ordered are listed, but only abnormal results are displayed) Labs Reviewed  SARS CORONAVIRUS 2 (TAT 6-24 HRS)    EKG   Radiology No results found.  Procedures Procedures (including critical care time)  Medications Ordered in UC Medications - No data to display  Initial Impression / Assessment and Plan / UC Course  I have reviewed the triage vital signs and the nursing notes.  Pertinent labs & imaging results that were available during my care of the  patient were reviewed by me and considered in my medical decision making (see chart for details).     Go home to rest Drink plenty of fluids Take Tylenol for pain or fever You may take over-the-counter cough and cold medicines as needed You must quarantine at home until your test result is available You can check for your test result in MyChart  Final Clinical Impressions(s) / UC Diagnoses   Final diagnoses:  Viral upper respiratory tract infection     Discharge Instructions     Check my chart for your test results May use over the counter cough and cold medicine   ED Prescriptions    None     PDMP not reviewed this encounter.   Raylene Everts, MD 06/30/20 2126

## 2020-07-01 LAB — SARS CORONAVIRUS 2 (TAT 6-24 HRS): SARS Coronavirus 2: NEGATIVE

## 2020-07-20 ENCOUNTER — Ambulatory Visit: Payer: Self-pay | Attending: Family | Admitting: Family

## 2020-07-20 ENCOUNTER — Encounter: Payer: Self-pay | Admitting: Family

## 2020-07-20 ENCOUNTER — Other Ambulatory Visit: Payer: Self-pay | Admitting: Family

## 2020-07-20 ENCOUNTER — Other Ambulatory Visit: Payer: Self-pay

## 2020-07-20 VITALS — BP 133/91 | HR 74 | Temp 97.5°F | Resp 16 | Ht 65.5 in | Wt 171.4 lb

## 2020-07-20 DIAGNOSIS — E785 Hyperlipidemia, unspecified: Secondary | ICD-10-CM

## 2020-07-20 DIAGNOSIS — I10 Essential (primary) hypertension: Secondary | ICD-10-CM

## 2020-07-20 DIAGNOSIS — Z789 Other specified health status: Secondary | ICD-10-CM

## 2020-07-20 MED ORDER — AMLODIPINE BESYLATE 10 MG PO TABS: 10.0000 mg | ORAL_TABLET | Freq: Every day | ORAL | 2 refills | Status: DC

## 2020-07-20 MED FILL — AMLODIPINE BESYLATE 10 MG T: 10 | 30 days supply | Qty: 30 | Fill #0

## 2020-07-20 NOTE — Progress Notes (Signed)
Patient ID: Shawn Porter, male    DOB: 11-12-1970  MRN: [id number]  CC: Chronic Conditions Follow-Up  Subjective: Jamonte Curfman is a 49 y.o. male with history of essential hypertension, hypertriglyceridemia, and dyslipidemia who presents for chronic conditions follow-up.  1. HYPERTENSION FOLLOW-UP: 01/01/2020: Visit with nurse practitioner Raul Del. During that encounter continued on Amlodipine.   07/20/2020: Currently taking: see medication list Have you taken your blood pressure medication today: [x]  Yes []  No  Med Adherence: [x]  Yes    []  No Medication side effects: []  Yes    [x]  No Adherence with salt restriction: [x]  Yes    []  No Exercise: Yes [x]  No []  Home Monitoring?: []  Yes    [x]  No Monitoring Frequency: []  Yes    [x]  No Home BP results range: []  Yes    [x]  No Smoking []  Yes [x]  No SOB? []  Yes    [x]  No Chest Pain?: []  Yes    [x]  No Leg swelling?: []  Yes    [x]  No Headaches?: []  Yes    [x]  No Dizziness? []  Yes    [x]  No Comments:   2. CHOLESTEROL FOLLOW-UP: 01/01/2020: Visit with nurse practitioner Raul Del. Continued on Rosuvastatin.  Last Lipid Panel results:  HDL  Date Value Ref Range Status  01/08/2020 42 >39 mg/dL Final   Triglycerides  Date Value Ref Range Status  01/08/2020 120 0 - 149 mg/dL Final    Med Adherence: [x]  Yes    []  No Medication side effects: []  Yes    [x]  No Muscle aches:  []  Yes    [x]  No Diet Adherence: [x]  Yes    []  No    Patient Active Problem List   Diagnosis Date Noted  . Discoloration of eye 11/23/2016  . Palpitations 11/23/2016  . Dyslipidemia 11/23/2016  . Health care maintenance 10/01/2015  . Right inguinal hernia 10/27/2014  . Hypertriglyceridemia 10/27/2014  . Pain in joint, shoulder region 10/27/2014  . Essential hypertension, benign 08/26/2014  . Dental caries 08/26/2014  . Radicular pain in right arm 10/16/2013  . Right arm pain 07/19/2013  . Other and unspecified hyperlipidemia 05/06/2013      Current Outpatient Medications on File Prior to Visit  Medication Sig Dispense Refill  . amLODipine (NORVASC) 5 MG tablet Take 1 tablet (5 mg total) by mouth daily. 90 tablet 3  . rosuvastatin (CRESTOR) 20 MG tablet Take 1 tablet (20 mg total) by mouth daily. 90 tablet 3   No current facility-administered medications on file prior to visit.    Allergies  Allergen Reactions  . Other Rash    Social History   Socioeconomic History  . Marital status: Married    Spouse name: Not on file  . Number of children: Not on file  . Years of education: Not on file  . Highest education level: Not on file  Occupational History  . Not on file  Tobacco Use  . Smoking status: Never Smoker  . Smokeless tobacco: Never Used  Vaping Use  . Vaping Use: Never used  Substance and Sexual Activity  . Alcohol use: Yes    Comment: occasonally  . Drug use: No  . Sexual activity: Yes    Birth control/protection: None    Comment: Married  Other Topics Concern  . Not on file  Social History Narrative  . Not on file   Social Determinants of Health   Financial Resource Strain:   . Difficulty of Paying Living Expenses: Not on file  Food Insecurity:   . Worried About Charity fundraiser in the Last Year: Not on file  . Ran Out of Food in the Last Year: Not on file  Transportation Needs:   . Lack of Transportation (Medical): Not on file  . Lack of Transportation (Non-Medical): Not on file  Physical Activity:   . Days of Exercise per Week: Not on file  . Minutes of Exercise per Session: Not on file  Stress:   . Feeling of Stress : Not on file  Social Connections:   . Frequency of Communication with Friends and Family: Not on file  . Frequency of Social Gatherings with Friends and Family: Not on file  . Attends Religious Services: Not on file  . Active Member of Clubs or Organizations: Not on file  . Attends Archivist Meetings: Not on file  . Marital Status: Not on file  Intimate  Partner Violence:   . Fear of Current or Ex-Partner: Not on file  . Emotionally Abused: Not on file  . Physically Abused: Not on file  . Sexually Abused: Not on file    Family History  Problem Relation Age of Onset  . Healthy Mother   . Heart failure Father   . Diabetes Sister     Past Surgical History:  Procedure Laterality Date  . HERNIA REPAIR      ROS: Review of Systems Negative except as stated above  PHYSICAL EXAM: Vitals with BMI 07/20/2020 06/30/2020 06/09/2019  Height 5' 5.5" - -  Weight 171 lbs 6 oz - -  BMI 27.06 - -  Systolic 237 628 315  Diastolic 91 84 176  Pulse 74 71 64   Wt Readings from Last 3 Encounters:  07/20/20 171 lb 6.4 oz (77.7 kg)  05/06/19 174 lb 3.2 oz (79 kg)  12/04/18 171 lb 1.2 oz (77.6 kg)    Physical Exam General appearance - alert, well appearing, and in no distress and oriented to person, place, and time Mental status - alert, oriented to person, place, and time, normal mood, behavior, speech, dress, motor activity, and thought processes Neck - supple, no significant adenopathy Lymphatics - no palpable lymphadenopathy, no hepatosplenomegaly Chest - clear to auscultation, no wheezes, rales or rhonchi, symmetric air entry, no tachypnea, retractions or cyanosis Heart - normal rate, regular rhythm, normal S1, S2, no murmurs, rubs, clicks or gallops Neurological - alert, oriented, normal speech, no focal findings or movement disorder noted, neck supple without rigidity, cranial nerves II through XII intact, motor and sensory grossly normal bilaterally, normal muscle tone, no tremors, strength 5/5  ASSESSMENT AND PLAN: 1. Essential hypertension: - Blood pressure not at goal during today's visit. Patient asymptomatic without chest pressure, chest pain, palpitations, and shortness of breath. - Level of control unknown as patient does not monitor blood pressure at home.  - Increase Amlodipine from 5 mg daily to 10 mg daily.  - Follow-up with  in 4 weeks with clinical pharmacist for blood pressure check. Write down your blood pressure readings each day and bring those results along with your home blood pressure monitor to your appointment. Medications may be adjusted at that time if needed. - Counseled on blood pressure goal of less than 130/80, low-sodium, DASH diet, medication compliance, 150 minutes of moderate intensity exercise per week as tolerated. Discussed medication compliance, adverse effects. - BMP to check kidney function and electrolyte balance. - Follow-up with primary provider in 3 months or sooner if needed. - Basic Metabolic Panel -  amLODipine (NORVASC) 10 MG tablet; Take 1 tablet (10 mg total) by mouth daily.  Dispense: 30 tablet; Refill: 2 - Amb Referral to Clinical Pharmacist  2. Dyslipidemia: - Practice low-fat heart healthy diet and at least 150 minutes of moderate intensity exercise weekly as tolerated.  - Continue Rosuvastatin 20 mg daily by mouth as prescribed. Refills available on file through March 2022. - Lipid panel last obtained 01/08/2020. - Follow-up with primary provider in 3 months or sooner if needed.  3. Language barrier: - [employer] participated during today's visit.  - Interpreter Name: Marton Redwood: [id number] - Interpreter Name: Audelia Hives, ID#: [id number]  Patient was given the opportunity to ask questions.  Patient verbalized understanding of the plan and was able to repeat key elements of the plan. Patient was given clear instructions to go to Emergency Department or return to medical center if symptoms don't improve, worsen, or new problems develop.The patient verbalized understanding.   Camillia Herter, NP

## 2020-07-20 NOTE — Patient Instructions (Signed)
Increase Amlodipine for high blood pressure. Follow-up in 1 month with clinical pharmacist for blood pressure check. Continue Rosuvastatin for cholesterol. Lab today. Follow-up with primary provider in 3 months or sooner if needed.   Aumente la amlodipina para la presin arterial alta. Seguimiento en 1 mes con farmacutico clnico para control de presin arterial. Contine con rosuvastatina para el colesterol. Laboratorio hoy. Haga un seguimiento con el proveedor de atencin primaria en 3 meses o antes si es necesario.  Hipertensin en los adultos Hypertension, Adult El trmino hipertensin es otra forma de denominar a la presin arterial elevada. La presin arterial elevada fuerza al corazn a trabajar ms para bombear la sangre. Esto puede causar problemas con el paso del [place]. Una lectura de presin arterial est compuesta por 2 nmeros. Hay un nmero superior (sistlico) sobre un nmero inferior (diastlico). Lo ideal es tener la presin arterial por debajo de 120/80. Las elecciones saludables pueden ayudar a Engineer, materials presin arterial, o tal vez necesite medicamentos para bajarla. Cules son las causas? Se desconoce la causa de esta afeccin. Algunas afecciones pueden estar relacionadas con la presin arterial alta. Qu incrementa el riesgo?  Fumar.  Tener diabetes mellitus tipo 2, colesterol alto, o ambos.  No hacer la cantidad suficiente de actividad fsica o ejercicio.  Tener sobrepeso.  Consumir mucha grasa, azcar, caloras o sal (sodio) en su dieta.  Beber alcohol en exceso.  Tener una enfermedad renal a largo plazo (crnica).  Tener antecedentes familiares de presin arterial alta.  Edad. Los riesgos aumentan con la edad.  Raza. El riesgo es mayor para las Retail banker.  Sexo. Antes de los 45aos, los hombres corren ms [employer]. Despus de los 65aos, las mujeres corren ms [employer].  Tener apnea obstructiva del  sueo.  Estrs. Cules son los signos o los sntomas?  Es posible que la presin arterial alta puede no cause sntomas. La presin arterial muy alta (crisis hipertensiva) puede provocar: ? Dolor de [place]. ? Sensaciones de preocupacin o nerviosismo (ansiedad). ? Falta de aire. ? Hemorragia nasal. ? Sensacin de Engineer, site (nuseas). ? Vmitos. ? Cambios en la forma de ver. ? Dolor muy intenso en el pecho. ? Convulsiones. Cmo se trata?  Esta afeccin se trata haciendo cambios saludables en el estilo de vida, por ejemplo: ? Consumir alimentos saludables. ? Hacer ms ejercicio. ? Beber menos alcohol.  El mdico puede recetarle medicamentos si los cambios en el estilo de vida no son suficientes para Child psychotherapist la presin arterial y si: ? El nmero de arriba est por encima de 130. ? El nmero de abajo est por encima de 80.  Su presin arterial personal ideal puede variar. Siga estas instrucciones en su casa: Comida y bebida   Si se lo dicen, siga el plan de alimentacin de DASH (Dietary Approaches to Stop Hypertension, Maneras de alimentarse para detener la hipertensin). Para seguir este plan: ? Llene la mitad del plato de cada comida con frutas y verduras. ? Llene un cuarto del plato de cada comida con cereales integrales. Los cereales integrales incluyen pasta integral, arroz integral y pan integral. ? Coma y beba productos lcteos con bajo contenido de grasa, como leche descremada o yogur bajo en grasas. ? Llene un cuarto del plato de cada comida con protenas bajas en grasa (magras). Las protenas bajas en grasa incluyen pescado, pollo sin piel, huevos, frijoles y tofu. ? Evite consumir carne grasa, carne curada y procesada, o pollo con piel. ?  Evite consumir alimentos prehechos o procesados.  Consuma menos de 1500 mg de sal por da.  No beba alcohol si: ? El mdico le indica que no lo haga. ? Est embarazada, puede estar embarazada o est tratando  de quedar embarazada.  Si bebe alcohol: ? Limite la cantidad que bebe a lo siguiente:  De 0 a 1 medida por da para las mujeres.  De 0 a 2 medidas por da para los hombres. ? Est atento a la cantidad de alcohol que hay en las bebidas que toma. En los [place], una medida equivale a una botella de cerveza de 12oz (371ml), un vaso de vino de 5oz (145ml) o un vaso de una bebida alcohlica de alta graduacin de 1oz (8ml). Estilo de vida   Trabaje con su mdico para mantenerse en un peso saludable o para perder peso. Pregntele a su mdico cul es el peso recomendable para usted.  Haga al menos 26minutos de ejercicio la [employer] de la Railroad. Estos pueden incluir caminar, nadar o andar en bicicleta.  Realice al menos 30 minutos de ejercicio que fortalezca sus msculos (ejercicios de resistencia) al menos 3 das a la [place]. Estos pueden incluir levantar pesas o hacer [place].  No consuma ningn producto que contenga nicotina o tabaco, como cigarrillos, cigarrillos electrnicos y tabaco de Higher education careers adviser. Si necesita ayuda para dejar de fumar, consulte al [employer].  Controle su presin arterial en su casa tal como le indic el mdico.  Concurra a todas las visitas de seguimiento como se lo haya indicado el mdico. Esto es importante. Medicamentos  [employer] de venta libre y los recetados solamente como se lo haya indicado el mdico. Siga cuidadosamente las indicaciones.  No omita las dosis de medicamentos para la presin arterial. Los medicamentos pierden eficacia si omite dosis. El hecho de omitir las dosis tambin [place] el riesgo de otros problemas.  Pregntele a su mdico a qu efectos secundarios o reacciones a los Careers information officer. Comunquese con un mdico si:  Piensa que tiene [place] reaccin a los medicamentos que est tomando.  Tiene dolores de cabeza frecuentes (recurrentes).  Se siente mareado.  Tiene hinchazn en los  tobillos.  Tiene problemas de visin. Solicite ayuda inmediatamente si:  Siente un dolor de cabeza muy intenso.  Empieza a sentirse desorientado (confundido).  Se siente dbil o adormecido.  Siente que va a desmayarse.  Tiene un dolor muy intenso en las siguientes zonas: ? Pecho. ? Vientre (abdomen).  Vomita ms de una vez.  Tiene dificultad para respirar. Resumen  El trmino hipertensin es otra forma de denominar a la presin arterial elevada.  La presin arterial elevada fuerza al corazn a trabajar ms para bombear la sangre.  Para la [employer], una presin arterial normal es menor que 120/80.  Las decisiones saludables pueden ayudarle a disminuir su presin arterial. Si no puede bajar su presin arterial mediante decisiones saludables, es posible que deba tomar medicamentos. Esta informacin no tiene Marine scientist el consejo del mdico. Asegrese de hacerle al mdico cualquier pregunta que tenga. Document Revised: 08/02/2018 Document Reviewed: 08/02/2018 Elsevier Patient Education  [place].

## 2020-07-20 NOTE — Progress Notes (Signed)
Wants to get flu shot today

## 2020-07-21 LAB — BASIC METABOLIC PANEL
BUN/Creatinine Ratio: 16 (ref 9–20)
BUN: 15 mg/dL (ref 6–24)
CO2: 23 mmol/L (ref 20–29)
Calcium: 9.5 mg/dL (ref 8.7–10.2)
Chloride: 104 mmol/L (ref 96–106)
Creatinine, Ser: 0.93 mg/dL (ref 0.76–1.27)
GFR calc Af Amer: 110 mL/min/{1.73_m2} (ref >59)
GFR calc non Af Amer: 95 mL/min/{1.73_m2} (ref >59)
Glucose: 112 mg/dL — ABNORMAL HIGH (ref 65–99)
Potassium: 4.5 mmol/L (ref 3.5–5.2)
Sodium: 141 mmol/L (ref 134–144)

## 2020-07-21 NOTE — Progress Notes (Signed)
Please call patient with update.   Kidney function normal.

## 2020-08-12 MED FILL — ROSUVASTATIN CALCIUM 20 MG: 20 | 30 days supply | Qty: 30 | Fill #2

## 2020-08-14 MED FILL — AMLODIPINE BESYLATE 10 MG T: 10 | 30 days supply | Qty: 30 | Fill #1

## 2020-09-29 MED FILL — AMLODIPINE BESYLATE 10 MG T: 10 | 30 days supply | Qty: 30 | Fill #2

## 2020-09-29 MED FILL — ROSUVASTATIN CALCIUM 20 MG: 20 | 30 days supply | Qty: 30 | Fill #3

## 2020-11-03 ENCOUNTER — Other Ambulatory Visit (HOSPITAL_COMMUNITY): Payer: Self-pay | Admitting: Urgent Care

## 2020-11-03 ENCOUNTER — Other Ambulatory Visit: Payer: Self-pay

## 2020-11-03 ENCOUNTER — Encounter (HOSPITAL_COMMUNITY): Payer: Self-pay

## 2020-11-03 ENCOUNTER — Ambulatory Visit (HOSPITAL_COMMUNITY)
Admission: EM | Admit: 2020-11-03 | Discharge: 2020-11-03 | Disposition: A | Discharge status: Home / Self Care | Payer: Self-pay | Attending: Urgent Care | Admitting: Urgent Care

## 2020-11-03 DIAGNOSIS — R059 Cough, unspecified: Secondary | ICD-10-CM | POA: Insufficient documentation

## 2020-11-03 DIAGNOSIS — R21 Rash and other nonspecific skin eruption: Secondary | ICD-10-CM | POA: Insufficient documentation

## 2020-11-03 DIAGNOSIS — Z20822 Contact with and (suspected) exposure to covid-19: Secondary | ICD-10-CM | POA: Insufficient documentation

## 2020-11-03 DIAGNOSIS — B349 Viral infection, unspecified: Secondary | ICD-10-CM | POA: Insufficient documentation

## 2020-11-03 DIAGNOSIS — T7840XA Allergy, unspecified, initial encounter: Secondary | ICD-10-CM | POA: Insufficient documentation

## 2020-11-03 DIAGNOSIS — R5383 Other fatigue: Secondary | ICD-10-CM | POA: Insufficient documentation

## 2020-11-03 LAB — SARS CORONAVIRUS 2 (TAT 6-24 HRS): SARS Coronavirus 2: NEGATIVE

## 2020-11-03 MED ORDER — PREDNISONE 20 MG PO TABS: ORAL_TABLET | ORAL | 0 refills | Status: DC

## 2020-11-03 MED ORDER — PROMETHAZINE-DM 6.25-15 MG/5ML PO SYRP: 5.0000 mL | ORAL_SOLUTION | Freq: Every evening | ORAL | 0 refills | Status: DC | PRN

## 2020-11-03 MED ORDER — HYDROXYZINE HCL 25 MG PO TABS: 12.5000 mg | ORAL_TABLET | Freq: Three times a day (TID) | ORAL | 0 refills | Status: DC | PRN

## 2020-11-03 MED ORDER — BENZONATATE 100 MG PO CAPS: 100.0000 mg | ORAL_CAPSULE | Freq: Three times a day (TID) | ORAL | 0 refills | Status: DC | PRN

## 2020-11-03 MED FILL — predniSONE 20 MG TABS: 20 | 5 days supply | Qty: 10 | Fill #0

## 2020-11-03 MED FILL — hydrOXYzine HCL 25 MG TABS: 25 | 10 days supply | Qty: 30 | Fill #0

## 2020-11-03 MED FILL — BENZONATATE 100 MG CAPS: 100 | 10 days supply | Qty: 60 | Fill #0

## 2020-11-03 MED FILL — PROMETHAZINE-DM 6.25-15 MG/: 6.25-15 | 20 days supply | Qty: 100 | Fill #0

## 2020-11-03 NOTE — ED Provider Notes (Signed)
[place]   MRN: [id number] DOB: 10/24/1971  Subjective:   Shawn Porter is a 50 y.o. male presenting for 2 to 3-day history of acute onset runny and stuffy nose, malaise and fatigue, cough.  Patient is Covid vaccinated.  He did end up taking Alka-Seltzer and some tequila to help with the symptoms.  He did start having hives and itching yesterday.  States that the only thing that he can think of was that he ate shrimp last night.  He took some Benadryl which helped short-term.  He also tried calamine lotion.  Denies fever, chest pain, shortness of breath, oral or tongue swelling.  No current facility-administered medications for this encounter.  Current Outpatient Medications:  .  amLODipine (NORVASC) 10 MG tablet, Take 1 tablet (10 mg total) by mouth daily., Disp: 30 tablet, Rfl: 2 .  rosuvastatin (CRESTOR) 20 MG tablet, Take 1 tablet (20 mg total) by mouth daily., Disp: 90 tablet, Rfl: 3   Allergies  Allergen Reactions  . Other Rash    Past Medical History:  Diagnosis Date  . Hyperlipidemia   . Hypertension      Past Surgical History:  Procedure Laterality Date  . HERNIA REPAIR      Family History  Problem Relation Age of Onset  . Healthy Mother   . Heart failure Father   . Diabetes Sister     Social History   Tobacco Use  . Smoking status: Never Smoker  . Smokeless tobacco: Never Used  Vaping Use  . Vaping Use: Never used  Substance Use Topics  . Alcohol use: Yes    Comment: occasonally  . Drug use: No    ROS   Objective:   Vitals: BP (!) 152/98 (BP Location: Right Arm)   Pulse 75   Temp 98.1 F (36.7 C) (Oral)   Resp 18   SpO2 97%   Physical Exam Constitutional:      General: He is not in acute distress.    Appearance: Normal appearance. He is well-developed and normal weight. He is not ill-appearing, toxic-appearing or diaphoretic.  HENT:     Head: Normocephalic and atraumatic.     Right Ear: Tympanic membrane, ear  canal and external ear normal. There is no impacted cerumen.     Left Ear: Tympanic membrane, ear canal and external ear normal. There is no impacted cerumen.     Nose: Nose normal. No congestion or rhinorrhea.     Mouth/Throat:     Mouth: Mucous membranes are moist.     Pharynx: Oropharynx is clear. No oropharyngeal exudate or posterior oropharyngeal erythema.     Comments: Airway is patent, patient is controlling secretions. Eyes:     General: No scleral icterus.       Right eye: No discharge.        Left eye: No discharge.     Extraocular Movements: Extraocular movements intact.     Conjunctiva/sclera: Conjunctivae normal.     Pupils: Pupils are equal, round, and reactive to light.  Cardiovascular:     Rate and Rhythm: Normal rate and regular rhythm.     Heart sounds: Normal heart sounds. No murmur heard. No friction rub. No gallop.   Pulmonary:     Effort: Pulmonary effort is normal. No respiratory distress.     Breath sounds: Normal breath sounds. No stridor. No wheezing, rhonchi or rales.  Musculoskeletal:     Cervical back: Normal range of motion and neck supple. No  rigidity. No muscular tenderness.  Skin:    General: Skin is warm and dry.     Findings: Rash (patches of urticaria diffusely scattered over his torso and to a smaller extent on his upper extremities and head) present.  Neurological:     General: No focal deficit present.     Mental Status: He is alert and oriented to person, place, and time.  Psychiatric:        Mood and Affect: Mood normal.        Behavior: Behavior normal.        Thought Content: Thought content normal.        Judgment: Judgment normal.     Assessment and Plan :   PDMP not reviewed this encounter.  1. Viral illness   2. Rash and nonspecific skin eruption   3. Allergic reaction, initial encounter   4. Cough     Suspect allergic reaction from eating shrimp, recommended hydroxyzine, prednisone.  Counseled that he should avoid these  kinds of foods in the future including shellfish in general.  Otherwise, will manage for viral illness such as viral URI, viral syndrome, viral rhinitis, COVID-19. Counseled patient on nature of COVID-19 including modes of transmission, diagnostic testing, management and supportive care.  Offered scripts for symptomatic relief. COVID 19 testing is pending. Counseled patient on potential for adverse effects with medications prescribed/recommended today, ER and return-to-clinic precautions discussed, patient verbalized understanding.     Wallis Bamberg, PA-C 11/03/20 1047

## 2020-11-03 NOTE — ED Triage Notes (Signed)
Pt presents with itchy rash in hands and head x 2 days. States rash started after eating shrimp, states he never had this reaction before.

## 2020-11-10 ENCOUNTER — Other Ambulatory Visit: Payer: Self-pay | Admitting: Family

## 2020-11-10 DIAGNOSIS — I10 Essential (primary) hypertension: Secondary | ICD-10-CM

## 2020-11-10 MED FILL — ROSUVASTATIN CALCIUM 20 MG: 20 | 30 days supply | Qty: 30 | Fill #4

## 2020-11-17 ENCOUNTER — Telehealth: Payer: Self-pay | Admitting: Nurse Practitioner

## 2020-11-17 NOTE — Telephone Encounter (Signed)
Pt is requesting a refill for:  amLODipine (NORVASC) 10 MG tablet [place] [place] [address]  [place], [place] [address]

## 2020-11-18 ENCOUNTER — Telehealth: Payer: Self-pay | Admitting: Nurse Practitioner

## 2020-11-18 ENCOUNTER — Other Ambulatory Visit: Payer: Self-pay | Admitting: Family

## 2020-11-18 DIAGNOSIS — I10 Essential (primary) hypertension: Secondary | ICD-10-CM

## 2020-11-18 NOTE — Telephone Encounter (Signed)
Pt is here for refill - he is completely out of   amLODipine (NORVASC) 10 MG tablet [place], [place]  [place], [place] [address]    Please advise and thank you

## 2020-11-19 MED FILL — AMLODIPINE BESYLATE 10 MG T: 10 | 30 days supply | Qty: 30 | Fill #0

## 2020-11-20 ENCOUNTER — Other Ambulatory Visit: Payer: Self-pay | Admitting: Nurse Practitioner

## 2020-12-09 ENCOUNTER — Ambulatory Visit: Payer: Self-pay | Admitting: Physician Assistant

## 2020-12-24 MED FILL — ROSUVASTATIN CALCIUM 20 MG: 20 | 30 days supply | Qty: 30 | Fill #5

## 2021-01-18 ENCOUNTER — Ambulatory Visit (HOSPITAL_COMMUNITY)
Admission: EM | Admit: 2021-01-18 | Discharge: 2021-01-18 | Disposition: A | Discharge status: Home / Self Care | Payer: Self-pay | Attending: Emergency Medicine | Admitting: Emergency Medicine

## 2021-01-18 ENCOUNTER — Encounter (HOSPITAL_COMMUNITY): Payer: Self-pay

## 2021-01-18 ENCOUNTER — Other Ambulatory Visit: Payer: Self-pay

## 2021-01-18 ENCOUNTER — Other Ambulatory Visit (HOSPITAL_COMMUNITY): Payer: Self-pay | Admitting: Emergency Medicine

## 2021-01-18 DIAGNOSIS — S39012A Strain of muscle, fascia and tendon of lower back, initial encounter: Secondary | ICD-10-CM

## 2021-01-18 DIAGNOSIS — I10 Essential (primary) hypertension: Secondary | ICD-10-CM

## 2021-01-18 DIAGNOSIS — E785 Hyperlipidemia, unspecified: Secondary | ICD-10-CM

## 2021-01-18 MED ORDER — TIZANIDINE HCL 2 MG PO TABS: 2.0000 mg | ORAL_TABLET | Freq: Every day | ORAL | 0 refills | Status: DC

## 2021-01-18 MED ORDER — NAPROXEN 500 MG PO TABS: 500.0000 mg | ORAL_TABLET | Freq: Two times a day (BID) | ORAL | 0 refills | Status: DC

## 2021-01-18 MED ORDER — ROSUVASTATIN CALCIUM 20 MG PO TABS: 20.0000 mg | ORAL_TABLET | Freq: Every day | ORAL | 0 refills | Status: DC

## 2021-01-18 MED ORDER — AMLODIPINE BESYLATE 10 MG PO TABS: 10.0000 mg | ORAL_TABLET | Freq: Every day | ORAL | 0 refills | Status: DC

## 2021-01-18 MED FILL — NAPROXEN 500 MG TABLET: 500 | 15 days supply | Qty: 30 | Fill #0

## 2021-01-18 MED FILL — AMLODIPINE BESYLATE 10 MG T: 10 | 30 days supply | Qty: 30 | Fill #0

## 2021-01-18 MED FILL — tiZANidine HCL 2 MG TABS: 2 | 20 days supply | Qty: 20 | Fill #0

## 2021-01-18 MED FILL — ROSUVASTATIN CALCIUM 20 MG: 20 | 30 days supply | Qty: 30 | Fill #0

## 2021-01-18 NOTE — ED Triage Notes (Signed)
Pt c/o lower back pain x 2 days. Pt states lying down on his back helps him feel better. Pt states the pain is localized.

## 2021-01-18 NOTE — Discharge Instructions (Signed)
Light and regular activity as tolerated.  See exercises provided.  Heat application while active can help with muscle spasms.  Sleep with pillow under your knees.   Naproxen twice a day, take with food.  Muscle relaxer at night, as it may cause drowsiness.  Follow up with your PCP as needed if pain persists and for long term management of your medications.    Actividad ligera y regular segn tolerancia. Ver ejercicios proporcionados. La aplicacin de calor mientras est activo puede ayudar con los espasmos musculares. Duerma con una almohada debajo de las rodillas. Naproxeno dos veces al da, tmelo con comida. Relajante muscular por la noche, ya que puede provocar somnolencia. Haga un seguimiento con su PCP segn sea necesario si el dolor persiste y para el control a largo plazo de sus medicamentos.

## 2021-01-18 NOTE — ED Provider Notes (Signed)
Shawn Porter    CSN: [id number] Arrival date & time: 01/18/21  1156      History   Chief Complaint Chief Complaint  Patient presents with  . Back Pain    lower    [name] is a 50 y.o. male.   Shawn Porter presents with complaints of right low back pain which started two days ago. No known injury. No radiation of pain to buttocks or down legs. Tylenol hasn't helped with pain. No saddle symptoms. No loss of bladder or bowel. Has had similar in the past, years ago. Ambulatory without difficulty. Pain worse with sitting up out of bed, improved while laying flat.     ROS per HPI, negative if not otherwise mentioned.      Past Medical History:  Diagnosis Date  . Hyperlipidemia   . Hypertension     Patient Active Problem List   Diagnosis Date Noted  . Discoloration of eye 11/23/2016  . Palpitations 11/23/2016  . Dyslipidemia 11/23/2016  . Health care maintenance 10/01/2015  . Right inguinal hernia 10/27/2014  . Hypertriglyceridemia 10/27/2014  . Pain in joint, shoulder region 10/27/2014  . Essential hypertension, benign 08/26/2014  . Dental caries 08/26/2014  . Radicular pain in right arm 10/16/2013  . Right arm pain 07/19/2013  . Other and unspecified hyperlipidemia 05/06/2013    Past Surgical History:  Procedure Laterality Date  . HERNIA REPAIR         Home Medications    Prior to Admission medications   Medication Sig Start Date End Date Taking? Authorizing Provider  naproxen (NAPROSYN) 500 MG tablet Take 1 tablet (500 mg total) by mouth 2 (two) times daily. 01/18/21  Yes Annasophia Crocker, Lanelle Bal B, NP  tiZANidine (ZANAFLEX) 2 MG tablet Take 1 tablet (2 mg total) by mouth at bedtime. 01/18/21  Yes Dominica Kent, Lanelle Bal B, NP  amLODipine (NORVASC) 10 MG tablet Take 1 tablet (10 mg total) by mouth daily. 01/18/21   Zigmund Gottron, NP  benzonatate (TESSALON) 100 MG capsule Take 1-2 capsules (100-200 mg total) by mouth 3 (three) times daily as  needed for cough. 11/03/20   Jaynee Eagles, PA-C  hydrOXYzine (ATARAX/VISTARIL) 25 MG tablet Take 0.5-1 tablets (12.5-25 mg total) by mouth every 8 (eight) hours as needed for itching. 11/03/20   Jaynee Eagles, PA-C  predniSONE (DELTASONE) 20 MG tablet Take 2 tablets daily with breakfast. 11/03/20   Jaynee Eagles, PA-C  promethazine-dextromethorphan (PROMETHAZINE-DM) 6.25-15 MG/5ML syrup Take 5 mLs by mouth at bedtime as needed for cough. 11/03/20   Jaynee Eagles, PA-C  rosuvastatin (CRESTOR) 20 MG tablet Take 1 tablet (20 mg total) by mouth daily. 01/18/21   Zigmund Gottron, NP    Family History Family History  Problem Relation Age of Onset  . Healthy Mother   . Heart failure Father   . Diabetes Sister     Social History Social History   Tobacco Use  . Smoking status: Never Smoker  . Smokeless tobacco: Never Used  Vaping Use  . Vaping Use: Never used  Substance Use Topics  . Alcohol use: Yes    Comment: occasonally  . Drug use: No     Allergies   Shrimp flavor and Other   Review of Systems Review of Systems   Physical Exam Triage Vital Signs ED Triage Vitals  Enc Vitals Group     BP 01/18/21 1254 129/88     Pulse Rate 01/18/21 1254 66     Resp 01/18/21 1254  17     Temp 01/18/21 1254 97.9 F (36.6 C)     Temp Source 01/18/21 1254 Oral     SpO2 01/18/21 1254 98 %     Weight --      Height --      Head Circumference --      Peak Flow --      Pain Score 01/18/21 1253 10     Pain Loc --      Pain Edu? --      Excl. in [place]? --    No data found.  Updated Vital Signs BP 129/88 (BP Location: Left Arm)   Pulse 66   Temp 97.9 F (36.6 C) (Oral)   Resp 17   SpO2 98%   Visual Acuity Right Eye Distance:   Left Eye Distance:   Bilateral Distance:    Right Eye Near:   Left Eye Near:    Bilateral Near:     Physical Exam Constitutional:      Appearance: He is well-developed.  Cardiovascular:     Rate and Rhythm: Normal rate.  Pulmonary:     Effort: Pulmonary effort is  normal.  Musculoskeletal:     Lumbar back: Tenderness present. No bony tenderness. Normal range of motion. Negative right straight leg raise test.       Back:     Comments: pain with transition from sit to lay and lay to sit; strength equal bilaterally; gross sensation intact; point tenderness to right low back musculature without bony tenderness   Skin:    General: Skin is warm and dry.  Neurological:     Mental Status: He is alert and oriented to person, place, and time.      UC Treatments / Results  Labs (all labs ordered are listed, but only abnormal results are displayed) Labs Reviewed - No data to display  EKG   Radiology No results found.  Procedures Procedures (including critical care time)  Medications Ordered in UC Medications - No data to display  Initial Impression / Assessment and Plan / UC Course  I have reviewed the triage vital signs and the nursing notes.  Pertinent labs & imaging results that were available during my care of the patient were reviewed by me and considered in my medical decision making (see chart for details).     Low back strain. No red flag findings. Pain management and expected course of rehab discussed. Return precautions provided. Ambulatory out of clinic without difficulty.   Final Clinical Impressions(s) / UC Diagnoses   Final diagnoses:  Strain of lumbar region, initial encounter     Discharge Instructions     Light and regular activity as tolerated.  See exercises provided.  Heat application while active can help with muscle spasms.  Sleep with pillow under your knees.   Naproxen twice a day, take with food.  Muscle relaxer at night, as it may cause drowsiness.  Follow up with your PCP as needed if pain persists and for long term management of your medications.    Actividad ligera y regular segn tolerancia. Ver ejercicios proporcionados. La aplicacin de calor mientras est activo puede ayudar con los espasmos  musculares. Duerma con una almohada debajo de las rodillas. Naproxeno dos veces al da, tmelo con comida. Relajante muscular por la noche, ya que puede provocar somnolencia. Haga un seguimiento con su PCP segn sea necesario si el dolor persiste y para el control a largo plazo de sus medicamentos.   ED Prescriptions  Medication Sig Dispense Auth. Provider   amLODipine (NORVASC) 10 MG tablet Take 1 tablet (10 mg total) by mouth daily. 30 tablet Augusto Gamble B, NP   rosuvastatin (CRESTOR) 20 MG tablet Take 1 tablet (20 mg total) by mouth daily. 30 tablet Augusto Gamble B, NP   naproxen (NAPROSYN) 500 MG tablet Take 1 tablet (500 mg total) by mouth 2 (two) times daily. 30 tablet Augusto Gamble B, NP   tiZANidine (ZANAFLEX) 2 MG tablet Take 1 tablet (2 mg total) by mouth at bedtime. 20 tablet Zigmund Gottron, NP     PDMP not reviewed this encounter.   Zigmund Gottron, NP 01/18/21 1439

## 2021-01-25 ENCOUNTER — Ambulatory Visit: Payer: Self-pay | Attending: Nurse Practitioner

## 2021-01-25 ENCOUNTER — Other Ambulatory Visit: Payer: Self-pay

## 2021-02-15 ENCOUNTER — Ambulatory Visit: Payer: Self-pay | Admitting: Nurse Practitioner

## 2021-02-24 ENCOUNTER — Other Ambulatory Visit: Payer: Self-pay

## 2021-02-24 ENCOUNTER — Telehealth: Payer: Self-pay | Admitting: Nurse Practitioner

## 2021-02-24 ENCOUNTER — Other Ambulatory Visit: Payer: Self-pay | Admitting: Nurse Practitioner

## 2021-02-24 DIAGNOSIS — I10 Essential (primary) hypertension: Secondary | ICD-10-CM

## 2021-02-24 MED ORDER — AMLODIPINE BESYLATE 10 MG PO TABS
ORAL_TABLET | Freq: Every day | ORAL | 0 refills | Status: DC
  Filled 2021-02-24: qty 30, 30d supply, fill #0

## 2021-02-24 NOTE — Telephone Encounter (Signed)
Will forward to provider  Pt was last seen 07/20/20 by Shawn Porter, 01/01/20 by Shawn Porter. Pt does have an upcoming appt with [facility] on 03/31/21. Please fill if pt can get a courtesy refill till appt.

## 2021-02-24 NOTE — Telephone Encounter (Signed)
Pt requesting refill for  amLODipine (NORVASC) 10 MG tablet [[id number]]  [facility]  Please advise and thank you.

## 2021-02-25 ENCOUNTER — Other Ambulatory Visit: Payer: Self-pay

## 2021-02-25 ENCOUNTER — Other Ambulatory Visit: Payer: Self-pay | Admitting: Emergency Medicine

## 2021-02-25 DIAGNOSIS — E785 Hyperlipidemia, unspecified: Secondary | ICD-10-CM

## 2021-02-26 ENCOUNTER — Other Ambulatory Visit: Payer: Self-pay

## 2021-03-10 ENCOUNTER — Other Ambulatory Visit: Payer: Self-pay | Admitting: Nurse Practitioner

## 2021-03-10 ENCOUNTER — Other Ambulatory Visit: Payer: Self-pay

## 2021-03-10 DIAGNOSIS — E785 Hyperlipidemia, unspecified: Secondary | ICD-10-CM

## 2021-03-10 MED ORDER — ROSUVASTATIN CALCIUM 20 MG PO TABS
ORAL_TABLET | Freq: Every day | ORAL | 0 refills | Status: DC
  Filled 2021-03-10: qty 30, 30d supply, fill #0

## 2021-03-12 ENCOUNTER — Other Ambulatory Visit: Payer: Self-pay

## 2021-03-31 ENCOUNTER — Other Ambulatory Visit: Payer: Self-pay

## 2021-03-31 ENCOUNTER — Ambulatory Visit: Payer: Self-pay | Attending: Nurse Practitioner | Admitting: Physician Assistant

## 2021-03-31 ENCOUNTER — Encounter: Payer: Self-pay | Admitting: Physician Assistant

## 2021-03-31 VITALS — BP 125/92 | HR 71 | Resp 16 | Wt 175.6 lb

## 2021-03-31 DIAGNOSIS — E785 Hyperlipidemia, unspecified: Secondary | ICD-10-CM

## 2021-03-31 DIAGNOSIS — M2550 Pain in unspecified joint: Secondary | ICD-10-CM

## 2021-03-31 DIAGNOSIS — I10 Essential (primary) hypertension: Secondary | ICD-10-CM

## 2021-03-31 MED ORDER — ROSUVASTATIN CALCIUM 20 MG PO TABS
ORAL_TABLET | Freq: Every day | ORAL | 5 refills | Status: DC
  Filled 2021-03-31: qty 30, fill #0
  Filled 2021-05-04: qty 30, 30d supply, fill #0
  Filled 2021-06-10: qty 30, 30d supply, fill #1
  Filled 2021-07-09: qty 30, 30d supply, fill #2
  Filled 2021-08-16: qty 30, 30d supply, fill #3
  Filled 2021-09-17 – 2021-09-30 (×2): qty 30, 30d supply, fill #4
  Filled 2021-11-02: qty 30, 30d supply, fill #5
  Filled 2021-11-04: qty 30, 30d supply, fill #0

## 2021-03-31 MED ORDER — NAPROXEN 500 MG PO TABS
500.0000 mg | ORAL_TABLET | Freq: Two times a day (BID) | ORAL | 0 refills | Status: DC
  Filled 2021-03-31: qty 60, 30d supply, fill #0

## 2021-03-31 MED ORDER — AMLODIPINE BESYLATE 10 MG PO TABS
ORAL_TABLET | Freq: Every day | ORAL | 0 refills | Status: DC
  Filled 2021-03-31: qty 30, 30d supply, fill #0
  Filled 2021-05-04: qty 30, 30d supply, fill #1
  Filled 2021-06-10: qty 30, 30d supply, fill #2

## 2021-03-31 NOTE — Patient Instructions (Signed)
Hipertensin en los adultos Hypertension, Adult La presin arterial alta (hipertensin) se produce cuando la fuerza de la sangre bombea a travs de las arterias con mucha fuerza. Las arterias son los vasos sanguneos que transportan la sangre desde el corazn al resto del cuerpo. La hipertensin hace que el corazn haga ms esfuerzo para Chiropodist y [employer] que las arterias se Teacher, music o Advertising account executive. La hipertensin no tratada o no controlada puede causar infarto de miocardio, insuficiencia cardaca, accidente cerebrovascular, enfermedad renal y otros problemas. Una lectura de la presin arterial consta de un nmero ms alto sobre un nmero ms bajo. En condiciones ideales, la presin arterial debe estar por debajo de 120/80. El primer nmero ("superior") es la presin sistlica. Es la medida de la presin de las arterias cuando el corazn late. El segundo nmero ("inferior") es la presin diastlica. Es la medida de la presin en las arterias cuando el corazn se relaja. Cules son las causas? Se desconoce la causa exacta de esta afeccin. Hay algunas afecciones que causan presin arterial alta o estn relacionadas con ella. Qu incrementa el riesgo? Algunos factores de riesgo de hipertensin estn bajo su control. Los siguientes factores pueden hacer que sea ms propenso a Armed forces training and education officer afeccin:  Fumar.  Tener diabetes mellitus tipo 2, colesterol alto, o ambos.  No hacer la cantidad suficiente de actividad fsica o ejercicio.  Tener sobrepeso.  Consumir mucha grasa, azcar, caloras o sal (sodio) en su dieta.  Beber alcohol en exceso. Algunos factores de riesgo para la presin arterial alta pueden ser difciles o imposibles de Quarry manager. Algunos de estos factores son los siguientes:  Tener enfermedad renal crnica.  Tener antecedentes familiares de presin arterial alta.  Edad. Los riesgos aumentan con la edad.  Raza. El riesgo es mayor para las Administrator, arts.  Sexo. Antes de los 45aos, los hombres corren ms [employer]. Despus de los 65aos, las mujeres corren ms [employer].  Tener apnea obstructiva del sueo.  Estrs. Cules son los signos o los sntomas? Es posible que la presin arterial alta puede no cause sntomas. La presin arterial muy alta (crisis hipertensiva) puede provocar:  Dolor de cabeza.  Ansiedad.  Falta de aire.  Hemorragia nasal.  Nuseas y vmitos.  Cambios en la visin.  Dolor de pecho intenso.  Convulsiones. Cmo se diagnostica? Esta afeccin se diagnostica al medir su presin arterial mientras se encuentra sentado, con el brazo apoyado sobre una superficie plana, las piernas sin cruzar y los pies bien apoyados en el piso. El brazalete del tensimetro debe colocarse directamente sobre la piel de la parte superior del brazo y al nivel de su corazn. Debe medirla al [facility] veces en el mismo brazo. Determinadas condiciones pueden causar una diferencia de presin arterial entre el brazo izquierdo y Insurance underwriter. Ciertos factores pueden provocar que las lecturas de la presin arterial sean inferiores o superiores a lo normal por un perodo corto de tiempo:  Si su presin arterial es ms alta cuando se encuentra en el consultorio del mdico que cuando la mide en su hogar, se denomina "hipertensin de bata blanca". La State [employer] de las personas que tienen esta afeccin no deben ser [employer].  Si su presin arterial es ms alta en el hogar que cuando se encuentra en el consultorio del mdico, se denomina "hipertensin enmascarada". La [employer] de las personas que tienen esta afeccin deben ser medicadas para Chief Technology Officer la presin arterial. Si tiene una lecturas de presin arterial alta durante  una visita o si tiene presin arterial normal con otros factores de riesgo, se le podr pedir que haga lo siguiente:  Que regrese otro da para volver a Chief Technology Officer su presin arterial  nuevamente.  Que se controle la presin arterial en su casa durante 1 semana o ms. Si se le diagnostica hipertensin, es posible que se le realicen otros anlisis de sangre o estudios de diagnstico por imgenes para ayudar a su mdico a comprender su riesgo general de tener otras afecciones. Cmo se trata? Esta afeccin se trata haciendo cambios saludables en el estilo de vida, tales como ingerir alimentos saludables, realizar ms ejercicio y reducir el consumo de alcohol. El mdico puede recetarle medicamentos si los cambios en el estilo de vida no son suficientes para Child psychotherapist la presin arterial y si:  Su presin arterial sistlica est por encima de 130.  Su presin arterial diastlica est por encima de 80. La presin arterial deseada puede variar en funcin de las enfermedades, la edad y otros factores personales. Siga estas instrucciones en su casa: Comida y bebida  Siga una dieta con alto contenido de fibras y [place], y con bajo contenido de sodio, Location manager agregada y Physicist, medical. Un ejemplo de plan alimenticio es la dieta DASH (Dietary Approaches to Stop Hypertension, Mtodos alimenticios para detener la hipertensin). Para alimentarse de esta manera: ? Coma mucha fruta y [place]. Trate de que la mitad del plato de cada comida sea de frutas y verduras. ? Coma cereales integrales, como pasta integral, arroz integral o pan integral. Llene aproximadamente un cuarto del plato con cereales integrales. ? Coma y beba productos lcteos con bajo contenido de grasa, como leche descremada o yogur bajo en grasas. ? Evite la ingesta de cortes de carne grasa, carne procesada o curada, y carne de ave con piel. Llene aproximadamente un cuarto del plato con protenas magras, como pescado, pollo sin piel, frijoles, huevos o tofu. ? Evite ingerir alimentos prehechos y procesados. En general, estos tienen mayor cantidad de sodio, azcar agregada y Wendee Copp.  Reduzca su ingesta diaria de sodio. La  mayora de las personas que tienen hipertensin deben comer menos de 1500 mg de sodio por [employer].  No beba alcohol si: ? Su mdico le indica no hacerlo. ? Est embarazada, puede estar embarazada o est tratando de quedar embarazada.  Si bebe alcohol: ? Limite la cantidad que bebe a lo siguiente:  De 0 a 1 medida por da para las mujeres.  De 0 a 2 medidas por da para los hombres. ? Est atento a la cantidad de alcohol que hay en las bebidas que toma. En los [place], una medida equivale a una botella de cerveza de 12oz (367ml), un vaso de vino de 5oz (168ml) o un vaso de una bebida alcohlica de alta graduacin de 1oz (4ml).   Estilo de vida  Trabaje con su mdico para mantener un peso saludable o Administrator, Civil Service. Pregntele cul es el peso recomendado para usted.  Haga al menos 82minutos de ejercicio la [employer] de la [place]. Estas actividades pueden incluir caminar, nadar o andar en bicicleta.  Incluya ejercicios para fortalecer sus msculos (ejercicios de resistencia), como Pilates o levantamiento de pesas, como parte de su rutina semanal de ejercicios. Intente realizar 54minutos de este tipo de ejercicios al [employer] a la [place].  No consuma ningn producto que contenga nicotina o tabaco, como cigarrillos, cigarrillos electrnicos y tabaco de Higher education careers adviser. Si necesita ayuda para dejar de fumar, consulte al  mdico.  Contrlese la presin arterial en su casa segn las indicaciones del mdico.  Concurra a todas las visitas de seguimiento como se lo haya indicado el mdico. Esto es importante.   Medicamentos  [employer] de venta libre y los recetados solamente como se lo haya indicado el mdico. Siga cuidadosamente las indicaciones. Los medicamentos para la presin arterial deben tomarse segn las indicaciones.  No omita las dosis de medicamentos para la presin arterial. Si lo hace, estar en riesgo de tener problemas y puede hacer que los medicamentos  sean menos eficaces.  Pregntele a su mdico a qu efectos secundarios o reacciones a los Careers information officer. Comunquese con un mdico si:  Piensa que tiene una reaccin a un medicamento que est tomando.  Tiene dolores de cabeza frecuentes (recurrentes).  Se siente mareado.  Tiene hinchazn en los tobillos.  Tiene problemas de visin. Solicite ayuda inmediatamente si:  Siente un dolor de cabeza intenso o confusin.  Siente debilidad inusual o adormecimiento.  Siente que va a desmayarse.  Siente un dolor intenso en el pecho o el abdomen.  Vomita repetidas veces.  Tiene dificultad para respirar. Resumen  La hipertensin se produce cuando la sangre bombea en las arterias con mucha fuerza. Si esta afeccin no se controla, podra correr riesgo de tener complicaciones graves.  La presin arterial deseada puede variar en funcin de las enfermedades, la edad y otros factores personales. Para la [employer], una presin arterial normal es menor que 120/80.  La hipertensin se trata con cambios en el estilo de vida, medicamentos o una combinacin de [employer]. Los McDonald's Corporation estilo de vida incluyen prdida de peso, ingerir alimentos sanos, seguir una dieta baja en sodio, hacer ms ejercicio y Environmental consultant consumo de alcohol. Esta informacin no tiene Marine scientist el consejo del mdico. Asegrese de hacerle al mdico cualquier pregunta que tenga. Document Revised: 08/02/2018 Document Reviewed: 08/02/2018 Elsevier Patient Education  2021 [employer].

## 2021-03-31 NOTE — Progress Notes (Signed)
Shawn Porter, is a 50 y.o. male  DZH:[id number]  MHD:[id number]  DOB - 11-Oct-1971  Subjective:  Chief Complaint and HPI: Shawn Porter is a 50 y.o. male here today to get med RF.  He is doing well.  He did not take amlodipine today.  He does have c/o all over joint aches that have been going on about 2 weeks.  No inflamed joints/redness. No tick bites.  No fever.  He does a lot of physical labor.  BP usu 120/low 80s OOO   ROS:   Constitutional:  No f/c, No night sweats, No unexplained weight loss. EENT:  No vision changes, No blurry vision, No hearing changes. No mouth, throat, or ear problems.  Respiratory: No cough, No SOB Cardiac: No CP, no palpitations GI:  No abd pain, No N/V/D. GU: No Urinary s/sx Musculoskeletal: see above Neuro: No headache, no dizziness, no motor weakness.  Skin: No rash Endocrine:  No polydipsia. No polyuria.  Psych: Denies SI/HI  No problems updated.  ALLERGIES: Allergies  Allergen Reactions  . Shrimp Flavor   . Other Rash    PAST MEDICAL HISTORY: Past Medical History:  Diagnosis Date  . Hyperlipidemia   . Hypertension     MEDICATIONS AT HOME: Prior to Admission medications   Medication Sig Start Date End Date Taking? Authorizing Provider  amLODipine (NORVASC) 10 MG tablet TAKE 1 TABLET (10 MG TOTAL) BY MOUTH DAILY. 03/31/21 06/29/21  Argentina Donovan, PA-C  benzonatate (TESSALON) 100 MG capsule TAKE 1-2 CAPSULES (100-200 MG TOTAL) BY MOUTH 3 (THREE) TIMES DAILY AS NEEDED FOR COUGH. Patient not taking: Reported on 03/31/2021 11/03/20 11/03/21  Jaynee Eagles, PA-C  hydrOXYzine (ATARAX/VISTARIL) 25 MG tablet TAKE 0.5-1 TABLETS (12.5-25 MG TOTAL) BY MOUTH EVERY 8 (EIGHT) HOURS AS NEEDED FOR ITCHING. Patient not taking: Reported on 03/31/2021 11/03/20 11/03/21  Jaynee Eagles, PA-C  naproxen (NAPROSYN) 500 MG tablet Take 1 tablet (500 mg total) by mouth 2 (two) times daily with a meal as needed for pain 03/31/21 03/31/22  Argentina Donovan, PA-C   promethazine-dextromethorphan (PROMETHAZINE-DM) 6.25-15 MG/5ML syrup TAKE 5 MLS BY MOUTH AT BEDTIME AS NEEDED FOR COUGH. Patient not taking: Reported on 03/31/2021 11/03/20 11/03/21  Jaynee Eagles, PA-C  rosuvastatin (CRESTOR) 20 MG tablet TAKE 1 TABLET (20 MG TOTAL) BY MOUTH DAILY. 03/31/21 03/31/22  Argentina Donovan, PA-C  tiZANidine (ZANAFLEX) 2 MG tablet TAKE 1 TABLET (2 MG TOTAL) BY MOUTH AT BEDTIME. Patient not taking: Reported on 03/31/2021 01/18/21 01/18/22  Augusto Gamble B, NP     Objective:  EXAM:   Vitals:   03/31/21 1119  BP: (!) 125/92  Pulse: 71  Resp: 16  SpO2: 97%  Weight: 175 lb 9.6 oz (79.7 kg)    General appearance : A&OX3. NAD. Non-toxic-appearing HEENT: Atraumatic and Normocephalic.  PERRLA. EOM intact.  Neck: supple, no JVD. No cervical lymphadenopathy. No thyromegaly Chest/Lungs:  Breathing-non-labored, Good air entry bilaterally, breath sounds normal without rales, rhonchi, or wheezing  CVS: S1 S2 regular, no murmurs, gallops, rubs  Extremities: Bilateral Lower Ext shows no edema, both legs are warm to touch with = pulse throughout Neurology:  CN II-XII grossly intact, Non focal.   Psych:  TP linear. J/I WNL. Normal speech. Appropriate eye contact and affect.  Skin:  No Rash  Data Review Lab Results  Component Value Date   HGBA1C 5.1 01/08/2020   HGBA1C 5.20 10/01/2015   HGBA1C 5.2 01/06/2010     Assessment & Plan   1.  Essential hypertension Controlled OOO-didn't take meds today - Comprehensive metabolic panel - CBC with Differential/Platelet - amLODipine (NORVASC) 10 MG tablet; TAKE 1 TABLET (10 MG TOTAL) BY MOUTH DAILY.  Dispense: 90 tablet; Refill: 0  2. Dyslipidemia - Comprehensive metabolic panel - CBC with Differential/Platelet - Lipid panel - rosuvastatin (CRESTOR) 20 MG tablet; TAKE 1 TABLET (20 MG TOTAL) BY MOUTH DAILY.  Dispense: 30 tablet; Refill: 5  3. Arthralgia, unspecified joint - Comprehensive metabolic panel - CBC with  Differential/Platelet - Sedimentation Rate - naproxen (NAPROSYN) 500 MG tablet; Take 1 tablet (500 mg total) by mouth 2 (two) times daily with a meal as needed for pain  Dispense: 60 tablet; Refill: 0 - Vitamin D, 25-hydroxy   Patient have been counseled extensively about nutrition and exercise  Return in about 6 months (around 09/30/2021) for PCP for chronic conditions.  The patient was given clear instructions to go to ER or return to medical center if symptoms don't improve, worsen or new problems develop. The patient verbalized understanding. The patient was told to call to get lab results if they haven't heard anything in the next week.     Freeman Caldron, PA-C [facility] and [place], [place]   03/31/2021, 11:48 AMPatient ID: Shawn Porter, male   DOB: Mar 05, 1971, 50 y.o.   MRN: [id number]

## 2021-04-01 ENCOUNTER — Other Ambulatory Visit: Payer: Self-pay | Admitting: Physician Assistant

## 2021-04-01 ENCOUNTER — Other Ambulatory Visit: Payer: Self-pay

## 2021-04-01 DIAGNOSIS — E559 Vitamin D deficiency, unspecified: Secondary | ICD-10-CM

## 2021-04-01 DIAGNOSIS — E781 Pure hyperglyceridemia: Secondary | ICD-10-CM

## 2021-04-01 LAB — CBC WITH DIFFERENTIAL/PLATELET
Basophils Absolute: 0.1 10*3/uL (ref 0.0–0.2)
Basos: 1 %
EOS (ABSOLUTE): 0.3 10*3/uL (ref 0.0–0.4)
Eos: 5 %
Hematocrit: 50.2 % (ref 37.5–51.0)
Hemoglobin: 17.6 g/dL (ref 13.0–17.7)
Immature Grans (Abs): 0 10*3/uL (ref 0.0–0.1)
Immature Granulocytes: 0 %
Lymphocytes Absolute: 2.7 10*3/uL (ref 0.7–3.1)
Lymphs: 45 %
MCH: 33 pg (ref 26.6–33.0)
MCHC: 35.1 g/dL (ref 31.5–35.7)
MCV: 94 fL (ref 79–97)
Monocytes Absolute: 0.4 10*3/uL (ref 0.1–0.9)
Monocytes: 7 %
Neutrophils Absolute: 2.5 10*3/uL (ref 1.4–7.0)
Neutrophils: 42 %
Platelets: 236 10*3/uL (ref 150–450)
RBC: 5.34 x10E6/uL (ref 4.14–5.80)
RDW: 13.2 % (ref 11.6–15.4)
WBC: 6 10*3/uL (ref 3.4–10.8)

## 2021-04-01 LAB — COMPREHENSIVE METABOLIC PANEL
ALT: 45 IU/L — ABNORMAL HIGH (ref 0–44)
AST: 28 IU/L (ref 0–40)
Albumin/Globulin Ratio: 2.1 (ref 1.2–2.2)
Albumin: 4.7 g/dL (ref 4.0–5.0)
Alkaline Phosphatase: 80 IU/L (ref 44–121)
BUN/Creatinine Ratio: 21 — ABNORMAL HIGH (ref 9–20)
BUN: 17 mg/dL (ref 6–24)
Bilirubin Total: 1.7 mg/dL — ABNORMAL HIGH (ref 0.0–1.2)
CO2: 23 mmol/L (ref 20–29)
Calcium: 9.6 mg/dL (ref 8.7–10.2)
Chloride: 100 mmol/L (ref 96–106)
Creatinine, Ser: 0.81 mg/dL (ref 0.76–1.27)
Globulin, Total: 2.2 g/dL (ref 1.5–4.5)
Glucose: 94 mg/dL (ref 65–99)
Potassium: 4.3 mmol/L (ref 3.5–5.2)
Sodium: 139 mmol/L (ref 134–144)
Total Protein: 6.9 g/dL (ref 6.0–8.5)
eGFR: 107 mL/min/{1.73_m2} (ref >59)

## 2021-04-01 LAB — LIPID PANEL
Chol/HDL Ratio: 4.6 ratio (ref 0.0–5.0)
Cholesterol, Total: 198 mg/dL (ref 100–199)
HDL: 43 mg/dL (ref >39)
LDL Chol Calc (NIH): 105 mg/dL — ABNORMAL HIGH (ref 0–99)
Triglycerides: 293 mg/dL — ABNORMAL HIGH (ref 0–149)
VLDL Cholesterol Cal: 50 mg/dL — ABNORMAL HIGH (ref 5–40)

## 2021-04-01 LAB — VITAMIN D 25 HYDROXY (VIT D DEFICIENCY, FRACTURES): Vit D, 25-Hydroxy: 20.3 ng/mL — ABNORMAL LOW (ref 30.0–100.0)

## 2021-04-01 LAB — SEDIMENTATION RATE: Sed Rate: 2 mm/hr (ref 0–30)

## 2021-04-01 MED ORDER — OMEGA-3-ACID ETHYL ESTERS 1 G PO CAPS
3000.0000 mg | ORAL_CAPSULE | Freq: Every day | ORAL | 5 refills | Status: DC
  Filled 2021-04-01 – 2021-12-17 (×2): qty 90, 30d supply, fill #0
  Filled 2022-03-02: qty 90, 30d supply, fill #1

## 2021-04-01 MED ORDER — VITAMIN D (ERGOCALCIFEROL) 1.25 MG (50000 UNIT) PO CAPS
50000.0000 [IU] | ORAL_CAPSULE | ORAL | 0 refills | Status: DC
  Filled 2021-04-01: qty 12, 84d supply, fill #0

## 2021-05-04 ENCOUNTER — Other Ambulatory Visit: Payer: Self-pay | Admitting: Urgent Care

## 2021-05-04 ENCOUNTER — Other Ambulatory Visit: Payer: Self-pay

## 2021-05-05 ENCOUNTER — Other Ambulatory Visit: Payer: Self-pay

## 2021-05-14 ENCOUNTER — Emergency Department (HOSPITAL_COMMUNITY)
Admission: EM | Admit: 2021-05-14 | Discharge: 2021-05-14 | Disposition: A | Discharge status: Home / Self Care | Payer: Self-pay | Attending: Emergency Medicine | Admitting: Emergency Medicine

## 2021-05-14 ENCOUNTER — Encounter (HOSPITAL_COMMUNITY): Payer: Self-pay

## 2021-05-14 ENCOUNTER — Other Ambulatory Visit: Payer: Self-pay

## 2021-05-14 DIAGNOSIS — Z79899 Other long term (current) drug therapy: Secondary | ICD-10-CM | POA: Insufficient documentation

## 2021-05-14 DIAGNOSIS — I10 Essential (primary) hypertension: Secondary | ICD-10-CM | POA: Insufficient documentation

## 2021-05-14 DIAGNOSIS — R55 Syncope and collapse: Secondary | ICD-10-CM

## 2021-05-14 DIAGNOSIS — R42 Dizziness and giddiness: Secondary | ICD-10-CM | POA: Insufficient documentation

## 2021-05-14 LAB — CBC WITH DIFFERENTIAL/PLATELET
Abs Immature Granulocytes: 0.04 10*3/uL (ref 0.00–0.07)
Basophils Absolute: 0.1 10*3/uL (ref 0.0–0.1)
Basophils Relative: 1 %
Eosinophils Absolute: 0.1 10*3/uL (ref 0.0–0.5)
Eosinophils Relative: 1 %
HCT: 44.9 % (ref 39.0–52.0)
Hemoglobin: 16.6 g/dL (ref 13.0–17.0)
Immature Granulocytes: 0 %
Lymphocytes Relative: 20 %
Lymphs Abs: 2 10*3/uL (ref 0.7–4.0)
MCH: 33.7 pg (ref 26.0–34.0)
MCHC: 37 g/dL — ABNORMAL HIGH (ref 30.0–36.0)
MCV: 91.1 fL (ref 80.0–100.0)
Monocytes Absolute: 0.5 10*3/uL (ref 0.1–1.0)
Monocytes Relative: 5 %
Neutro Abs: 7.2 10*3/uL (ref 1.7–7.7)
Neutrophils Relative %: 73 %
Platelets: 219 10*3/uL (ref 150–400)
RBC: 4.93 MIL/uL (ref 4.22–5.81)
RDW: 12.1 % (ref 11.5–15.5)
WBC: 9.8 10*3/uL (ref 4.0–10.5)
nRBC: 0 % (ref 0.0–0.2)

## 2021-05-14 LAB — COMPREHENSIVE METABOLIC PANEL
ALT: 46 U/L — ABNORMAL HIGH (ref 0–44)
AST: 38 U/L (ref 15–41)
Albumin: 4.1 g/dL (ref 3.5–5.0)
Alkaline Phosphatase: 57 U/L (ref 38–126)
Anion gap: 5 (ref 5–15)
BUN: 18 mg/dL (ref 6–20)
CO2: 23 mmol/L (ref 22–32)
Calcium: 8.9 mg/dL (ref 8.9–10.3)
Chloride: 108 mmol/L (ref 98–111)
Creatinine, Ser: 0.87 mg/dL (ref 0.61–1.24)
GFR, Estimated: >60 mL/min (ref >60)
Glucose, Bld: 132 mg/dL — ABNORMAL HIGH (ref 70–99)
Potassium: 4.5 mmol/L (ref 3.5–5.1)
Sodium: 136 mmol/L (ref 135–145)
Total Bilirubin: 1.6 mg/dL — ABNORMAL HIGH (ref 0.3–1.2)
Total Protein: 6.1 g/dL — ABNORMAL LOW (ref 6.5–8.1)

## 2021-05-14 LAB — D-DIMER, QUANTITATIVE: D-Dimer, Quant: 0.29 ug/mL-FEU (ref 0.00–0.50)

## 2021-05-14 MED ORDER — SODIUM CHLORIDE 0.9 % IV BOLUS
1000.0000 mL | Freq: Once | INTRAVENOUS | Status: AC
  Administered 2021-05-14: 1000 mL via INTRAVENOUS

## 2021-05-14 NOTE — ED Notes (Signed)
Pt verbalizes understanding of discharge instructions. Opportunity for questions and answers were provided. Pt discharged from the ED.   ?

## 2021-05-14 NOTE — Discharge Instructions (Addendum)
You were seen today in the emergency department.  Lightheadedness, dizziness.  I suspect this was likely due to dehydration or something we call vasovagal symptoms syncope.  I included information on that which you can read about.  Please continue to drink lots of fluids.  Avoid being out in the heat.  Get lots of sleep tonight.  If you wake up tomorrow feeling better, that is great.  However, if you wake up tomorrow and feel worse than you do today are more fatigued or like you to return to the ED for further evaluation.  I also like you to schedule a follow-up appointment with your primary care doctor in 2 weeks.  Te vieron hoy en el departamento de emergencias. Aturdimiento, mareos. Sospecho que [employer] se debi a la deshidratacin o algo que llamamos sncope de sntomas vasovagales. Inclu informacin sobre lo que [employer]. Contine bebiendo muchos lquidos. Evite estar afuera en el calor. Duerme mucho esta noche. Si te levantas maana sintindote mejor, genial. Sin embargo, si se despierta maana y se siente peor que hoy, est ms fatigado o desea regresar al servicio de urgencias para una evaluacin adicional. Tambin me gustara que programe una cita de seguimiento con su mdico de atencin primaria en 2 semanas.

## 2021-05-14 NOTE — ED Triage Notes (Signed)
Pt bib [facility] from home with complaints of syncope. This am pt was changing oil on car and says he just woke up on the ground. Pt states he was dizzy before passing out but denies pain. Pt states he feels like he is drunk now but denies etoh or other medications.   EMS vitals:88/50 BP - 140/96 after 450 ml NS, 103 CBG

## 2021-05-14 NOTE — ED Provider Notes (Signed)
[facility] EMERGENCY DEPARTMENT Provider Note   CSN: [id number] Arrival date & time: 05/14/21  1459     History No chief complaint on file.   Shawn Porter is a 50 y.o. male.  The history is provided by the patient. The history is limited by a language barrier. A language interpreter was used.   Patient presents with dizziness.  Happened when he was changing oil in his car earlier today.  He is not sure if he did not lose consciousness, but states he did not hit his head.  States he felt dizzy as and lightheaded, not like the room was spinning.  States something like this happened 1 year ago, but at that time he felt weak.  He does not have any weakness at this time.  He is not nauseated or having any vomiting.  He denies any chest pain or shortness of breath.  He has not tried any alleviating factors.  Does not cite any aggravating factors.  The dizziness has improved, although he is feeling thirsty.  Past Medical History:  Diagnosis Date   Hyperlipidemia    Hypertension     Patient Active Problem List   Diagnosis Date Noted   Discoloration of eye 11/23/2016   Palpitations 11/23/2016   Dyslipidemia 11/23/2016   Health care maintenance 10/01/2015   Right inguinal hernia 10/27/2014   Hypertriglyceridemia 10/27/2014   Pain in joint, shoulder region 10/27/2014   Essential hypertension, benign 08/26/2014   Dental caries 08/26/2014   Radicular pain in right arm 10/16/2013   Right arm pain 07/19/2013   Other and unspecified hyperlipidemia 05/06/2013    Past Surgical History:  Procedure Laterality Date   HERNIA REPAIR         Family History  Problem Relation Age of Onset   Healthy Mother    Heart failure Father    Diabetes Sister     Social History   Tobacco Use   Smoking status: Never   Smokeless tobacco: Never  Vaping Use   Vaping Use: Never used  Substance Use Topics   Alcohol use: Yes    Comment: occasonally   Drug use: No     Home Medications Prior to Admission medications   Medication Sig Start Date End Date Taking? Authorizing Provider  amLODipine (NORVASC) 10 MG tablet TAKE 1 TABLET (10 MG TOTAL) BY MOUTH DAILY. 03/31/21 06/29/21  Argentina Donovan, PA-C  benzonatate (TESSALON) 100 MG capsule TAKE 1-2 CAPSULES (100-200 MG TOTAL) BY MOUTH 3 (THREE) TIMES DAILY AS NEEDED FOR COUGH. Patient not taking: Reported on 03/31/2021 11/03/20 11/03/21  Jaynee Eagles, PA-C  hydrOXYzine (ATARAX/VISTARIL) 25 MG tablet TAKE 0.5-1 TABLETS (12.5-25 MG TOTAL) BY MOUTH EVERY 8 (EIGHT) HOURS AS NEEDED FOR ITCHING. Patient not taking: Reported on 03/31/2021 11/03/20 11/03/21  Jaynee Eagles, PA-C  naproxen (NAPROSYN) 500 MG tablet Take 1 tablet (500 mg total) by mouth 2 (two) times daily with a meal as needed for pain 03/31/21 03/31/22  Argentina Donovan, PA-C  omega-3 acid ethyl esters (LOVAZA) 1 g capsule Take 3 capsules (3,000 mg total) by mouth daily. 04/01/21   Argentina Donovan, PA-C  promethazine-dextromethorphan (PROMETHAZINE-DM) 6.25-15 MG/5ML syrup TAKE 5 MLS BY MOUTH AT BEDTIME AS NEEDED FOR COUGH. Patient not taking: Reported on 03/31/2021 11/03/20 11/03/21  Jaynee Eagles, PA-C  rosuvastatin (CRESTOR) 20 MG tablet TAKE 1 TABLET (20 MG TOTAL) BY MOUTH DAILY. 03/31/21 03/31/22  Argentina Donovan, PA-C  tiZANidine (ZANAFLEX) 2 MG tablet TAKE 1 TABLET (2  MG TOTAL) BY MOUTH AT BEDTIME. Patient not taking: Reported on 03/31/2021 01/18/21 01/18/22  Zigmund Gottron, NP  Vitamin D, Ergocalciferol, (DRISDOL) 1.25 MG (50000 UNIT) CAPS capsule Take 1 capsule (50,000 Units total) by mouth every 7 (seven) days. 04/01/21   Argentina Donovan, PA-C    Allergies    Shrimp flavor and Other  Review of Systems   Review of Systems  Constitutional:  Negative for fatigue and fever.  Respiratory:  Negative for shortness of breath.   Cardiovascular:  Negative for chest pain.  Gastrointestinal:  Negative for nausea and vomiting.  Musculoskeletal:  Negative for gait problem.   Neurological:  Positive for dizziness and light-headedness. Negative for syncope and weakness.   Physical Exam Updated Vital Signs BP (!) 153/90 (BP Location: Right Arm)   Pulse 85   Temp 98.2 F (36.8 C) (Oral)   Resp 20   Ht 5\' 5"  (1.651 m)   Wt 79.7 kg   SpO2 100%   BMI 29.24 kg/m   Physical Exam Vitals and nursing note reviewed. Exam conducted with a chaperone present.  Constitutional:      Appearance: Normal appearance. He is well-developed.  HENT:     Head: Normocephalic and atraumatic.  Eyes:     General: No scleral icterus.       Right eye: No discharge.        Left eye: No discharge.     Extraocular Movements: Extraocular movements intact.     Conjunctiva/sclera: Conjunctivae normal.     Pupils: Pupils are equal, round, and reactive to light.  Cardiovascular:     Rate and Rhythm: Normal rate and regular rhythm.     Pulses: Normal pulses.     Heart sounds: Normal heart sounds. No murmur heard.   No friction rub. No gallop.  Pulmonary:     Effort: Pulmonary effort is normal. No respiratory distress.     Breath sounds: Normal breath sounds.  Abdominal:     General: Abdomen is flat. Bowel sounds are normal. There is no distension.     Palpations: Abdomen is soft.     Tenderness: There is no abdominal tenderness.  Musculoskeletal:     Cervical back: Neck supple.  Skin:    General: Skin is warm and dry.     Coloration: Skin is not jaundiced.  Neurological:     Mental Status: He is alert. Mental status is at baseline.     Coordination: Coordination normal.     Comments: Cranial nerves III through XII grossly intact.  Grip strength is equal bilaterally.  Patient is able to raise each leg against resistance.  Strength is 5 out of 5.  Answers questions appropriately.    ED Results / Procedures / Treatments   Labs (all labs ordered are listed, but only abnormal results are displayed) Labs Reviewed - No data to display  EKG None  Radiology No results  found.  Procedures Procedures   Medications Ordered in ED Medications - No data to display  ED Course  I have reviewed the triage vital signs and the nursing notes.  Pertinent labs & imaging results that were available during my care of the patient were reviewed by me and considered in my medical decision making (see chart for details).  Clinical Course as of 05/14/21 1648  Fri May 14, 2021  1544 CBC with Differential(!) No leukocytosis, no anemia  [HS]  1617 Total Bilirubin(!): 1.6 At baseline [HS]  1617 Comprehensive metabolic panel(!) No electrolyte derangement,  no AKI, mildly elevated ALT and bilirubin but both appear to be at baseline  [HS]  1647 D-Dimer, Quant: 0.29 Doubt PE [HS]    Clinical Course User Index [HS] Sherrill Raring, PA-C   MDM Rules/Calculators/A&P                          Patient vitals are stable, he is mildly hypertensive but not hypotensive.  He does not report any head trauma no focal deficits on exam.  I do not think he needs imaging at this time.  We will assess him for PE, although I do think this is unlikely given he does not have significant risk factors, chest pain, leg swelling, shortness of breath.  Additionally, he is not tachycardic.  I also considered possibility of vasovagal syncope, especially given the heat.  We will consider electrolyte derangement if this has to do with dehydration nutritional deficiencies.  We will also consider arrhythmia, will get an EKG at this time.  Please see ED course for dictation of results.  I suspect his symptoms today were caused by vasovagal syncope secondary to dehydration.  He reports that symptoms happen when he sat up 1 week from when he was changing the oil on his back.  A similar event happened a year ago, and although there was no weakness this time this seems similar.  He does not have any signs of emergent disease based on this work-up.  He is resting comfortably and looks well.  He has no complaints of  pain anywhere.  His vitals are stable.  I discussed return precautions with the patient as well as instructions to follow-up.  Patient verbalized understanding and agreement with the plan. Final Clinical Impression(s) / ED Diagnoses Final diagnoses:  None    Rx / DC Orders ED Discharge Orders     None        Sherrill Raring, PA-C 05/14/21 [place], DO 05/15/21 1512

## 2021-06-03 ENCOUNTER — Telehealth: Payer: Self-pay | Admitting: Physician Assistant

## 2021-06-10 ENCOUNTER — Other Ambulatory Visit: Payer: Self-pay

## 2021-06-11 ENCOUNTER — Other Ambulatory Visit: Payer: Self-pay

## 2021-06-16 NOTE — Progress Notes (Signed)
Patient ID: Shawn Porter, male   DOB: 1971-01-04, 50 y.o.   MRN: [id number]    Shawn Porter, is a 50 y.o. male  [id number]  [id number]  DOB - April 04, 1971  Chief Complaint  Patient presents with   Hospitalization Follow-up       Subjective:   Shawn Porter is a 50 y.o. male here today for a follow up visit after being seen in the ED for vasovagal syncope on 05/14/2021.  The episode was related to dehydration and has not recurred.  He denies any CP/SOB.    He needs to get BP meds RF and wants 90 day Rx.    He has been having problems with premature ejaculation with his wife for about 6 months.  No erectile issues  Patient has No headache, No chest pain, No abdominal pain - No Nausea, No new weakness tingling or numbness, No Cough - SOB.  No problems updated.  ALLERGIES: Allergies  Allergen Reactions   Shrimp Flavor    Other Rash    PAST MEDICAL HISTORY: Past Medical History:  Diagnosis Date   Hyperlipidemia    Hypertension     MEDICATIONS AT HOME: Prior to Admission medications   Medication Sig Start Date End Date Taking? Authorizing Provider  naproxen (NAPROSYN) 500 MG tablet Take 1 tablet (500 mg total) by mouth 2 (two) times daily with a meal as needed for pain 03/31/21 03/31/22 Yes Carollee Nussbaumer M, PA-C  omega-3 acid ethyl esters (LOVAZA) 1 g capsule Take 3 capsules (3,000 mg total) by mouth daily. 04/01/21  Yes Freeman Caldron M, PA-C  rosuvastatin (CRESTOR) 20 MG tablet TAKE 1 TABLET (20 MG TOTAL) BY MOUTH DAILY. 03/31/21 03/31/22 Yes Mavis Gravelle, Dionne Bucy, PA-C  Vitamin D, Ergocalciferol, (DRISDOL) 1.25 MG (50000 UNIT) CAPS capsule Take 1 capsule (50,000 Units total) by mouth every 7 (seven) days. 04/01/21  Yes Freeman Caldron M, PA-C  amLODipine (NORVASC) 10 MG tablet TAKE 1 TABLET (10 MG TOTAL) BY MOUTH DAILY. 06/17/21 09/15/21  Argentina Donovan, PA-C  benzonatate (TESSALON) 100 MG capsule TAKE 1-2 CAPSULES (100-200 MG TOTAL) BY MOUTH 3 (THREE) TIMES DAILY  AS NEEDED FOR COUGH. Patient not taking: No sig reported 11/03/20 11/03/21  Jaynee Eagles, PA-C  hydrOXYzine (ATARAX/VISTARIL) 25 MG tablet TAKE 0.5-1 TABLETS (12.5-25 MG TOTAL) BY MOUTH EVERY 8 (EIGHT) HOURS AS NEEDED FOR ITCHING. Patient not taking: No sig reported 11/03/20 11/03/21  Jaynee Eagles, PA-C  promethazine-dextromethorphan (PROMETHAZINE-DM) 6.25-15 MG/5ML syrup TAKE 5 MLS BY MOUTH AT BEDTIME AS NEEDED FOR COUGH. Patient not taking: No sig reported 11/03/20 11/03/21  Jaynee Eagles, PA-C  tiZANidine (ZANAFLEX) 2 MG tablet TAKE 1 TABLET (2 MG TOTAL) BY MOUTH AT BEDTIME. Patient not taking: No sig reported 01/18/21 01/18/22  Augusto Gamble B, NP    ROS: Neg HEENT Neg resp Neg cardiac Neg GI Neg GU Neg MS Neg psych Neg neuro  Objective:   Vitals:   06/17/21 1118  BP: 117/72  Pulse: 61  SpO2: 97%  Weight: 175 lb (79.4 kg)  Height: '5\' 5"'$  (1.651 m)   Exam General appearance : Awake, alert, not in any distress. Speech Clear. Not toxic looking HEENT: Atraumatic and Normocephalic, pupils equally reactive to light and accomodation Neck: Supple, no JVD. No cervical lymphadenopathy.  Chest: Good air entry bilaterally, CTAB.  No rales/rhonchi/wheezing CVS: S1 S2 regular, no murmurs.  Extremities: B/L Lower Ext shows no edema, both legs are warm to touch Neurology: Awake alert, and oriented X 3, CN II-XII  intact, Non focal Skin: No Rash  Data Review Lab Results  Component Value Date   HGBA1C 5.1 01/08/2020   HGBA1C 5.20 10/01/2015   HGBA1C 5.2 01/06/2010    Assessment & Plan   1. Essential hypertension controlled - amLODipine (NORVASC) 10 MG tablet; TAKE 1 TABLET (10 MG TOTAL) BY MOUTH DAILY.  Dispense: 90 tablet; Refill: 1  2. Dyslipidemia Continue crestor-has RF  3. Encounter for examination following treatment at hospital Doing well  4. Premature ejaculation Can try OTC creams etc available at [place] - Ambulatory referral to Urology  5. Vasovagal episode Resolved;  single episode-no recurrence    Patient have been counseled extensively about nutrition and exercise. Other issues discussed during this visit include: low cholesterol diet, weight control and daily exercise, foot care, annual eye examinations at Ophthalmology, importance of adherence with medications and regular follow-up. We also discussed long term complications of uncontrolled diabetes and hypertension.   Return in about 3 months (around 09/17/2021) for PCP.  The patient was given clear instructions to go to ER or return to medical center if symptoms don't improve, worsen or new problems develop. The patient verbalized understanding. The patient was told to call to get lab results if they haven't heard anything in the next week.      Freeman Caldron, PA-C [facility] [place], [place]   06/17/2021, 1:31 PM

## 2021-06-17 ENCOUNTER — Other Ambulatory Visit: Payer: Self-pay

## 2021-06-17 ENCOUNTER — Ambulatory Visit: Payer: Self-pay | Attending: Physician Assistant | Admitting: Physician Assistant

## 2021-06-17 VITALS — BP 117/72 | HR 61 | Ht 65.0 in | Wt 175.0 lb

## 2021-06-17 DIAGNOSIS — E785 Hyperlipidemia, unspecified: Secondary | ICD-10-CM

## 2021-06-17 DIAGNOSIS — I10 Essential (primary) hypertension: Secondary | ICD-10-CM

## 2021-06-17 DIAGNOSIS — F524 Premature ejaculation: Secondary | ICD-10-CM

## 2021-06-17 DIAGNOSIS — Z09 Encounter for follow-up examination after completed treatment for conditions other than malignant neoplasm: Secondary | ICD-10-CM

## 2021-06-17 DIAGNOSIS — R55 Syncope and collapse: Secondary | ICD-10-CM

## 2021-06-17 MED ORDER — AMLODIPINE BESYLATE 10 MG PO TABS
ORAL_TABLET | Freq: Every day | ORAL | 1 refills | Status: DC
  Filled 2021-06-17: qty 90, fill #0
  Filled 2021-07-09: qty 30, 30d supply, fill #0
  Filled 2021-08-16: qty 30, 30d supply, fill #1
  Filled 2021-09-17 – 2021-09-30 (×2): qty 30, 30d supply, fill #2
  Filled 2021-12-17: qty 30, 30d supply, fill #0
  Filled 2022-02-07: qty 30, 30d supply, fill #1

## 2021-07-09 ENCOUNTER — Other Ambulatory Visit: Payer: Self-pay

## 2021-07-09 ENCOUNTER — Ambulatory Visit: Payer: Self-pay | Admitting: Urology

## 2021-07-12 ENCOUNTER — Other Ambulatory Visit: Payer: Self-pay

## 2021-07-29 ENCOUNTER — Ambulatory Visit: Payer: Self-pay

## 2021-07-29 ENCOUNTER — Telehealth: Payer: Self-pay | Admitting: Nurse Practitioner

## 2021-07-29 NOTE — Telephone Encounter (Signed)
Copied from [place] [phone]. Topic: General - Call Back - No Documentation >> Jul 29, 2021 12:14 PM Erick Blinks wrote: Reason for CRM: Pt wants to speak to [facility], please advise. Returning call, regarding appt.  Best contact: [phone]

## 2021-08-16 ENCOUNTER — Other Ambulatory Visit: Payer: Self-pay

## 2021-09-14 ENCOUNTER — Telehealth: Payer: Self-pay | Admitting: Nurse Practitioner

## 2021-09-14 ENCOUNTER — Ambulatory Visit (HOSPITAL_BASED_OUTPATIENT_CLINIC_OR_DEPARTMENT_OTHER): Payer: Self-pay | Admitting: Nurse Practitioner

## 2021-09-14 ENCOUNTER — Encounter: Payer: Self-pay | Admitting: Nurse Practitioner

## 2021-09-14 DIAGNOSIS — Z1159 Encounter for screening for other viral diseases: Secondary | ICD-10-CM

## 2021-09-14 DIAGNOSIS — Z1211 Encounter for screening for malignant neoplasm of colon: Secondary | ICD-10-CM

## 2021-09-14 DIAGNOSIS — R7309 Other abnormal glucose: Secondary | ICD-10-CM

## 2021-09-14 DIAGNOSIS — E785 Hyperlipidemia, unspecified: Secondary | ICD-10-CM

## 2021-09-14 DIAGNOSIS — I10 Essential (primary) hypertension: Secondary | ICD-10-CM

## 2021-09-14 NOTE — Progress Notes (Signed)
Virtual Visit via Telephone Note Due to national recommendations of social distancing due to COVID 19, telehealth visit is felt to be most appropriate for this patient at this time.  I discussed the limitations, risks, security and privacy concerns of performing an evaluation and management service by telephone and the availability of in person appointments. I also discussed with the patient that there may be a patient responsible charge related to this service. The patient expressed understanding and agreed to proceed.    I connected with Shawn Porter on 09/14/21  at   9:50 AM EST  EDT by telephone and verified that I am speaking with the correct person using two identifiers.  Location of Patient: Private Residence   Location of Provider: [facility]    Persons participating in Telemedicine visit: Zelda Fleming FNP-BC Quintyn Inthavong  Spanish Translator [id number]    History of Present Illness: Telemedicine visit for: HTN He has a past medical history of Hyperlipidemia and Hypertension.   HTN Blood pressure is well controlled with amlodipine 10 mg daily. He does not self monitor his readings at home. He has no questions or concerns today  BP Readings from Last 3 Encounters:  06/17/21 117/72  05/14/21 128/79  03/31/21 (!) 125/92       Past Medical History:  Diagnosis Date   Hyperlipidemia    Hypertension     Past Surgical History:  Procedure Laterality Date   HERNIA REPAIR      Family History  Problem Relation Age of Onset   Healthy Mother    Heart failure Father    Diabetes Sister     Social History   Socioeconomic History   Marital status: Married    Spouse name: Not on file   Number of children: Not on file   Years of education: Not on file   Highest education level: Not on file  Occupational History   Not on file  Tobacco Use   Smoking status: Never   Smokeless tobacco: Never  Vaping Use   Vaping Use: Never used   Substance and Sexual Activity   Alcohol use: Yes    Comment: occasonally   Drug use: No   Sexual activity: Yes    Birth control/protection: None    Comment: Married  Other Topics Concern   Not on file  Social History Narrative   Not on file   Social Determinants of Health   Financial Resource Strain: Not on file  Food Insecurity: Not on file  Transportation Needs: Not on file  Physical Activity: Not on file  Stress: Not on file  Social Connections: Not on file     Observations/Objective: Awake, alert and oriented x 3   Review of Systems  Constitutional:  Negative for fever, malaise/fatigue and weight loss.  HENT: Negative.  Negative for nosebleeds.   Eyes: Negative.  Negative for blurred vision, double vision and photophobia.  Respiratory: Negative.  Negative for cough and shortness of breath.   Cardiovascular: Negative.  Negative for chest pain, palpitations and leg swelling.  Gastrointestinal: Negative.  Negative for heartburn, nausea and vomiting.  Musculoskeletal: Negative.  Negative for myalgias.  Neurological: Negative.  Negative for dizziness, focal weakness, seizures and headaches.  Psychiatric/Behavioral: Negative.  Negative for suicidal ideas.    Assessment and Plan: Diagnoses and all orders for this visit:  Primary hypertension -     CMP14+EGFR; Future Continue all antihypertensives as prescribed.  Remember to bring in your blood pressure log with   you for your follow up appointment.  DASH/Mediterranean Diets are healthier choices for HTN.    Need for hepatitis C screening test -     HCV Ab w Reflex to Quant PCR; Future  Dyslipidemia, goal LDL below 100 -     Lipid panel; Future  Elevated glucose -     Hemoglobin A1c; Future  Colon cancer screening -     Fecal occult blood, imunochemical([facility]); Future    Follow Up Instructions Return if symptoms worsen or fail to improve.     I discussed the assessment and treatment plan with the  patient. The patient was provided an opportunity to ask questions and all were answered. The patient agreed with the plan and demonstrated an understanding of the instructions.   The patient was advised to call back or seek an in-person evaluation if the symptoms worsen or if the condition fails to improve as anticipated.  I provided 10 minutes of non-face-to-face time during this encounter including median intraservice time, reviewing previous notes, labs, imaging, medications and explaining diagnosis and management.  Gildardo Pounds, FNP-BC

## 2021-09-17 ENCOUNTER — Other Ambulatory Visit: Payer: Self-pay

## 2021-09-17 ENCOUNTER — Ambulatory Visit: Payer: Self-pay | Admitting: Nurse Practitioner

## 2021-09-27 ENCOUNTER — Telehealth: Payer: Self-pay | Admitting: Nurse Practitioner

## 2021-09-27 NOTE — Telephone Encounter (Signed)
Patient would like to renew [employer] and speak with Development worker, community.

## 2021-09-28 ENCOUNTER — Other Ambulatory Visit: Payer: Self-pay

## 2021-09-28 NOTE — Telephone Encounter (Signed)
I return Pt call, Schedule a financial appt for 10/01/21

## 2021-09-28 NOTE — Telephone Encounter (Signed)
See encounter

## 2021-09-30 ENCOUNTER — Other Ambulatory Visit: Payer: Self-pay

## 2021-10-01 ENCOUNTER — Other Ambulatory Visit: Payer: Self-pay

## 2021-10-01 ENCOUNTER — Ambulatory Visit: Payer: Self-pay | Attending: Nurse Practitioner

## 2021-10-06 ENCOUNTER — Ambulatory Visit: Payer: Self-pay | Admitting: Physician Assistant

## 2021-10-07 ENCOUNTER — Other Ambulatory Visit: Payer: Self-pay

## 2021-10-07 ENCOUNTER — Ambulatory Visit: Payer: Self-pay | Admitting: Physician Assistant

## 2021-10-07 VITALS — BP 133/90 | HR 72 | Temp 98.2°F | Resp 18 | Ht 65.5 in | Wt 168.0 lb

## 2021-10-07 DIAGNOSIS — L918 Other hypertrophic disorders of the skin: Secondary | ICD-10-CM

## 2021-10-07 DIAGNOSIS — Z131 Encounter for screening for diabetes mellitus: Secondary | ICD-10-CM

## 2021-10-07 DIAGNOSIS — D170 Benign lipomatous neoplasm of skin and subcutaneous tissue of head, face and neck: Secondary | ICD-10-CM

## 2021-10-07 DIAGNOSIS — B079 Viral wart, unspecified: Secondary | ICD-10-CM

## 2021-10-07 LAB — POCT GLYCOSYLATED HEMOGLOBIN (HGB A1C): Hemoglobin A1C: 5.1 % (ref 4.0–5.6)

## 2021-10-07 NOTE — Patient Instructions (Signed)
I encourage you to treat your facial wart with OTC wart remover   I encourage you to use warm compresses on the area above your eyebrow several times a day for the next few days.  Please feel free to return to the mobile unit if this does not improve.  Please let us know if there is anything else we can do for you  Kennieth Rad, PA-C Physician Assistant [place] [email or web address]   Warts Warts are small growths on the skin. They are common and can occur on many areas of the body. A person may have one wart or several warts. In many cases, warts do not require treatment. They usually go away on their own over a period of many months to a few years. If needed, warts that cause problems or do not go away on their own can be treated. What are the causes? Warts are caused by a type of virus that is called human papillomavirus (HPV). This virus can spread from person to person through direct contact. Warts can also spread to other areas of the body when a person scratches a wart and then scratches another area of his or her body. What increases the risk? You are more likely to develop this condition if: You are 45-102 years old. You have a weakened body defense system (immune system). You are Caucasian. What are the signs or symptoms? The main symptom of this condition is small growths on the skin. Warts may: Be round or oval or have an irregular shape. Have a rough surface. Range in color from skin color to light yellow, brown, or gray. Generally be less than  inch (1.3 cm) in size. Go away and then come back again. Most warts are painless, but some can be painful if they are large or occur in an area of the body where pressure will be applied to them, such as the bottom of the foot. How is this diagnosed? A wart can usually be diagnosed based on its appearance. In some cases, a tissue sample may be removed (biopsy) to be  looked at under a microscope. How is this treated? In many cases, warts do not need treatment. Sometimes treatment is desired. If treatment is needed or desired, options may include: Applying medicated solutions, creams, or patches to the wart. These may be over-the-counter or prescription medicines that make the skin soft so that layers will gradually shed away. In many cases, the medicine is applied one or two times per day and covered with a bandage. Putting duct tape over the top of the wart (occlusion). You will leave the tape in place for as long as told by your health care provider and then replace it with a new strip of tape. This is done until the wart goes away. Freezing the wart with liquid nitrogen (cryotherapy). Burning the wart with: Laser treatment. An electrified probe (electrocautery). Injection of a medicine (Candida antigen) into the wart to help the body's immune system fight off the wart. Surgery to remove the wart. Follow these instructions at home: Medicines Apply over-the-counter and prescription medicines only as told by your health care provider. Do not apply over-the-counter wart medicines to your face or genitals unless your health care provider tells you to do that. Lifestyle Keep your immune system healthy. To do this: Eat a healthy, balanced diet. Get enough sleep. Do not use any products that contain nicotine or tobacco, such as cigarettes and e-cigarettes. If you need help quitting, ask  your health care provider. General instructions  Wash your hands after you touch a wart. Do not scratch or pick at a wart. Avoid shaving hair that is over a wart. Keep all follow-up visits as told by your health care provider. This is important. Contact a health care provider if: Your warts do not improve after treatment. You have redness, swelling, or pain at the site of a wart. You have bleeding from a wart that does not stop with light pressure. You have diabetes and  you develop a wart. Summary Warts are small growths on the skin. They are common and can occur on many areas of the body. In many cases, warts do not need treatment. Sometimes treatment is desired. If treatment is needed or desired, there are several treatment options. Apply over-the-counter and prescription medicines only as told by your health care provider. Wash your hands after you touch a wart. Keep all follow-up visits as told by your health care provider. This is important. This information is not intended to replace advice given to you by your health care provider. Make sure you discuss any questions you have with your health care provider. Document Revised: 03/06/2018 Document Reviewed: 03/06/2018 Elsevier Patient Education  2022 [place].  Lipoma A lipoma is a noncancerous (benign) tumor that is made up of fat cells. This is a very common type of soft-tissue growth. Lipomas are usually found under the skin (subcutaneous). They may occur in any tissue of the body that contains fat. Common areas for lipomas to appear include the back, arms, shoulders, buttocks, and thighs. Lipomas grow slowly, and they are usually painless. Most lipomas do not cause problems and do not require treatment. What are the causes? The cause of this condition is not known. What increases the risk? You are more likely to develop this condition if: You are 5-42 years old. You have a family history of lipomas. What are the signs or symptoms? A lipoma usually appears as a small, round bump under the skin. In most cases, the lump will: Feel soft or rubbery. Not cause pain or other symptoms. However, if a lipoma is located in an area where it pushes on nerves, it can become painful or cause other symptoms. How is this diagnosed? A lipoma can usually be diagnosed with a physical exam. You may also have tests to confirm the diagnosis and to rule out other conditions. Tests may include: Imaging tests, such  as a CT scan or an MRI. Removal of a tissue sample to be looked at under a microscope (biopsy). How is this treated? Treatment for this condition depends on the size of the lipoma and whether it is causing any symptoms. For small lipomas that are not causing problems, no treatment is needed. If a lipoma is bigger or it causes problems, surgery may be done to remove the lipoma. Lipomas can also be removed to improve appearance. Most often, the procedure is done after applying a medicine that numbs the area (local anesthetic). Liposuction may be done to reduce the size of the lipoma before it is removed through surgery, or it may be done to remove the lipoma. Lipomas are removed with this method in order to limit incision size and scarring. A liposuction tube is inserted through a small incision into the lipoma, and the contents of the lipoma are removed through the tube with suction. Follow these instructions at home: Watch your lipoma for any changes. Keep all follow-up visits as told by your health care  provider. This is important. Contact a health care provider if: Your lipoma becomes larger or hard. Your lipoma becomes painful, red, or increasingly swollen. These could be signs of infection or a more serious condition. Get help right away if: You develop tingling or numbness in an area near the lipoma. This could indicate that the lipoma is causing nerve damage. Summary A lipoma is a noncancerous tumor that is made up of fat cells. Most lipomas do not cause problems and do not require treatment. If a lipoma is bigger or it causes problems, surgery may be done to remove the lipoma. Contact a health care provider if your lipoma becomes larger or hard, or if it becomes painful, red, or increasingly swollen. Pain, redness, and swelling could be signs of infection or a more serious condition. This information is not intended to replace advice given to you by your health care provider. Make sure you  discuss any questions you have with your health care provider. Document Revised: 06/03/2019 Document Reviewed: 06/03/2019 Elsevier Patient Education  2022 [place], Adult A skin tag (acrochordon) is a soft, extra growth of skin. Most skin tags are skin-colored and rarely bigger than a pencil eraser. They commonly form in areas where there is frequent rubbing, or friction, on the skin. This may be where there are folds in the skin, such as the eyelids, neck, armpit, or groin. Skin tags are not dangerous, and they do not spread from person to person (are not contagious). You may have one skin tag or several. Skin tags do not require treatment. However, your health care provider may recommend removal of a skin tag if it: Gets irritated from clothing or jewelry. Bleeds. Is visible and unsightly. What are the causes? This condition is linked with: Increasing age. Pregnancy. Diabetes. Obesity. What are the signs or symptoms? Skin tags usually do not cause symptoms unless they get irritated by items touching your skin, such as clothing or jewelry. When this happens, you may have pain, itching, or bleeding. How is this diagnosed? This condition is diagnosed with an evaluation from your health care provider. No testing is needed for diagnosis. How is this treated? Treatment for this condition depends on whether you have symptoms. If a skin tag needs to be removed, your health care provider can remove it with: A simple surgical procedure using scissors. A procedure that involves freezing your skin tag with a gas in liquid form (liquid nitrogen). A procedure that uses heat to destroy your skin tag (electrodessication). Your health care provider may also remove your skin tag if it is visible or unsightly, Follow these instructions at home: Watch for any changes in your skin tag. A normal skin tag does not require any other special care at home. Take over-the-counter and prescription  medicines only as told by your health care provider. Keep all follow-up visits as told by your health care provider. This is important. Contact a health care provider if: You have a skin tag that: Becomes painful. Changes color. Bleeds. Swells. Summary Skin tags are soft, extra growths of skin found in areas of frequent rubbing or friction. Skin tags usually do not cause symptoms. If symptoms occur, you may have pain, itching, or bleeding. If your skin tag causes symptoms or is unsightly, your health care provider can remove it. This information is not intended to replace advice given to you by your health care provider. Make sure you discuss any questions you have with your health care provider.  Document Revised: 08/19/2019 Document Reviewed: 08/19/2019 Elsevier Patient Education  [place].

## 2021-10-07 NOTE — Progress Notes (Signed)
Patient has eaten and taken medication today Patient reports new moles appearing over the past 3 weeks and a nodule above the right eye that is tender to touch.

## 2021-10-07 NOTE — Progress Notes (Signed)
Established Patient Office Visit  Subjective:  Patient ID: Shawn Porter, male    DOB: 02-22-1971  Age: 50 y.o. MRN: [id number]  CC:  Chief Complaint  Patient presents with   face nodule     [name] reports that he started having a a nodule on the left side of his nose approximately 3 weeks ago.  States that it is tender, has tried using hand lotion without any relief.  Denies any recent viruses.  Reports that he also has a enlarged area above his right eyebrow, states this was noticeable approximately 3 weeks ago as well.  Denies any injury or trauma, has not tried anything for relief.  Also complains of 3 small nodules on the right side of his lower neck near his collar line.  States those have been present for "some time" describes them as irritating, has not tried anything for relief.       Past Medical History:  Diagnosis Date   Hyperlipidemia    Hypertension     Past Surgical History:  Procedure Laterality Date   HERNIA REPAIR      Family History  Problem Relation Age of Onset   Healthy Mother    Heart failure Father    Diabetes Sister     Social History   Socioeconomic History   Marital status: Married    Spouse name: Not on file   Number of children: Not on file   Years of education: Not on file   Highest education level: Not on file  Occupational History   Not on file  Tobacco Use   Smoking status: Never   Smokeless tobacco: Never  Vaping Use   Vaping Use: Never used  Substance and Sexual Activity   Alcohol use: Yes    Comment: occasonally   Drug use: No   Sexual activity: Yes    Birth control/protection: None    Comment: Married  Other Topics Concern   Not on file  Social History Narrative   Not on file   Social Determinants of Health   Financial Resource Strain: Not on file  Food Insecurity: Not on file  Transportation Needs: Not on file  Physical Activity: Not on file  Stress: Not on file  Social Connections:  Not on file  Intimate Partner Violence: Not on file    Outpatient Medications Prior to Visit  Medication Sig Dispense Refill   amLODipine (NORVASC) 10 MG tablet TAKE 1 TABLET (10 MG TOTAL) BY MOUTH DAILY. 90 tablet 1   omega-3 acid ethyl esters (LOVAZA) 1 g capsule Take 3 capsules (3,000 mg total) by mouth daily. 90 capsule 5   rosuvastatin (CRESTOR) 20 MG tablet TAKE 1 TABLET (20 MG TOTAL) BY MOUTH DAILY. 30 tablet 5   hydrOXYzine (ATARAX/VISTARIL) 25 MG tablet TAKE 0.5-1 TABLETS (12.5-25 MG TOTAL) BY MOUTH EVERY 8 (EIGHT) HOURS AS NEEDED FOR ITCHING. (Patient not taking: Reported on 03/31/2021) 30 tablet 0   naproxen (NAPROSYN) 500 MG tablet Take 1 tablet (500 mg total) by mouth 2 (two) times daily with a meal as needed for pain (Patient not taking: Reported on 10/07/2021) 60 tablet 0   Vitamin D, Ergocalciferol, (DRISDOL) 1.25 MG (50000 UNIT) CAPS capsule Take 1 capsule (50,000 Units total) by mouth every 7 (seven) days. (Patient not taking: Reported on 10/07/2021) 16 capsule 0   No facility-administered medications prior to visit.    Allergies  Allergen Reactions   Shrimp Flavor    Other Rash  ROS Review of Systems  Constitutional: Negative.   HENT: Negative.    Eyes: Negative.   Respiratory:  Negative for shortness of breath.   Cardiovascular:  Negative for chest pain.  Gastrointestinal: Negative.   Endocrine: Negative.   Genitourinary: Negative.   Musculoskeletal: Negative.   Skin:  Negative for color change.  Allergic/Immunologic: Negative.   Neurological: Negative.   Hematological: Negative.   Psychiatric/Behavioral: Negative.       Objective:    Physical Exam Vitals and nursing note reviewed.  Constitutional:      Appearance: Normal appearance.  HENT:     Head: Normocephalic and atraumatic.     Comments: See photo    Right Ear: External ear normal.     Left Ear: External ear normal.     Nose: Nose normal.     Mouth/Throat:     Mouth: Mucous membranes are  moist.     Pharynx: Oropharynx is clear.  Eyes:     Extraocular Movements: Extraocular movements intact.     Conjunctiva/sclera: Conjunctivae normal.     Pupils: Pupils are equal, round, and reactive to light.  Cardiovascular:     Rate and Rhythm: Normal rate and regular rhythm.     Pulses: Normal pulses.     Heart sounds: Normal heart sounds.  Pulmonary:     Effort: Pulmonary effort is normal.     Breath sounds: Normal breath sounds.  Musculoskeletal:        General: Normal range of motion.     Cervical back: Normal range of motion and neck supple.  Skin:    General: Skin is warm and dry.     Comments: See photo  Neurological:     General: No focal deficit present.     Mental Status: He is alert and oriented to person, place, and time.  Psychiatric:        Mood and Affect: Mood normal.        Behavior: Behavior normal.        Thought Content: Thought content normal.        Judgment: Judgment normal.         BP 133/90 (BP Location: Left Arm, Patient Position: Sitting, Cuff Size: Normal)   Pulse 72   Temp 98.2 F (36.8 C) (Oral)   Resp 18   Ht 5' 5.5" (1.664 m)   Wt 168 lb (76.2 kg)   SpO2 100%   BMI 27.53 kg/m  Wt Readings from Last 3 Encounters:  10/07/21 168 lb (76.2 kg)  06/17/21 175 lb (79.4 kg)  05/14/21 175 lb 11.3 oz (79.7 kg)     Health Maintenance Due  Topic Date Due   Hepatitis C Screening  Never done   COLON CANCER SCREENING ANNUAL FOBT  Never done   Zoster Vaccines- Shingrix (1 of 2) Never done   COVID-19 Vaccine (3 - Booster for Pfizer series) 04/11/2020   INFLUENZA VACCINE  05/31/2021    There are no preventive care reminders to display for this patient.  Lab Results  Component Value Date   TSH 1.91 11/23/2016   Lab Results  Component Value Date   WBC 9.8 05/14/2021   HGB 16.6 05/14/2021   HCT 44.9 05/14/2021   MCV 91.1 05/14/2021   PLT 219 05/14/2021   Lab Results  Component Value Date   NA 136 05/14/2021   K 4.5 05/14/2021    CO2 23 05/14/2021   GLUCOSE 132 (H) 05/14/2021   BUN 18 05/14/2021   CREATININE  0.87 05/14/2021   BILITOT 1.6 (H) 05/14/2021   ALKPHOS 57 05/14/2021   AST 38 05/14/2021   ALT 46 (H) 05/14/2021   PROT 6.1 (L) 05/14/2021   ALBUMIN 4.1 05/14/2021   CALCIUM 8.9 05/14/2021   ANIONGAP 5 05/14/2021   EGFR 107 03/31/2021   Lab Results  Component Value Date   CHOL 198 03/31/2021   Lab Results  Component Value Date   HDL 43 03/31/2021   Lab Results  Component Value Date   LDLCALC 105 (H) 03/31/2021   Lab Results  Component Value Date   TRIG 293 (H) 03/31/2021   Lab Results  Component Value Date   CHOLHDL 4.6 03/31/2021   Lab Results  Component Value Date   HGBA1C 5.1 10/07/2021      Assessment & Plan:   Problem List Items Addressed This Visit   None Visit Diagnoses     Wart of face    -  Primary   Lipoma of face       Skin tag       Screening for diabetes mellitus (DM)       Relevant Orders   HgB A1c (Completed)       No orders of the defined types were placed in this encounter.  1. Wart of face Patient education on supportive care , OTC wart removal  2. Lipoma of face Patient education given for supportive care  3. Skin tag Patient education given on supportive care   4. Screening for diabetes mellitus (DM) A1C 5.1  - HgB A1c   I have reviewed the patient's medical history (PMH, PSH, Social History, Family History, Medications, and allergies) , and have been updated if relevant. I spent 20 minutes reviewing chart and  face to face time with patient.   Follow-up: Return if symptoms worsen or fail to improve.    Loraine Grip Mayers, PA-C

## 2021-11-03 ENCOUNTER — Other Ambulatory Visit: Payer: Self-pay

## 2021-11-04 ENCOUNTER — Other Ambulatory Visit (HOSPITAL_COMMUNITY): Payer: Self-pay

## 2021-11-04 ENCOUNTER — Other Ambulatory Visit: Payer: Self-pay

## 2021-11-04 ENCOUNTER — Ambulatory Visit (HOSPITAL_COMMUNITY)
Admission: EM | Admit: 2021-11-04 | Discharge: 2021-11-04 | Disposition: A | Discharge status: Home / Self Care | Payer: Self-pay | Attending: Emergency Medicine | Admitting: Emergency Medicine

## 2021-11-04 ENCOUNTER — Encounter (HOSPITAL_COMMUNITY): Payer: Self-pay | Admitting: Emergency Medicine

## 2021-11-04 DIAGNOSIS — J101 Influenza due to other identified influenza virus with other respiratory manifestations: Secondary | ICD-10-CM

## 2021-11-04 LAB — POC INFLUENZA A AND B ANTIGEN (URGENT CARE ONLY)
INFLUENZA A ANTIGEN, POC: POSITIVE — AB
INFLUENZA B ANTIGEN, POC: NEGATIVE

## 2021-11-04 MED ORDER — OSELTAMIVIR PHOSPHATE 75 MG PO CAPS
75.0000 mg | ORAL_CAPSULE | Freq: Two times a day (BID) | ORAL | 0 refills | Status: DC
  Filled 2021-11-04 (×2): qty 10, 5d supply, fill #0

## 2021-11-04 MED ORDER — ACETAMINOPHEN 325 MG PO TABS
ORAL_TABLET | ORAL | Status: AC
  Filled 2021-11-04: qty 2

## 2021-11-04 MED ORDER — CYCLOBENZAPRINE HCL 10 MG PO TABS
10.0000 mg | ORAL_TABLET | Freq: Every day | ORAL | 0 refills | Status: DC
  Filled 2021-11-04 (×2): qty 10, 10d supply, fill #0

## 2021-11-04 MED ORDER — IBUPROFEN 800 MG PO TABS
800.0000 mg | ORAL_TABLET | Freq: Three times a day (TID) | ORAL | 0 refills | Status: DC
  Filled 2021-11-04 (×2): qty 21, 7d supply, fill #0

## 2021-11-04 MED ORDER — ACETAMINOPHEN 325 MG PO TABS
650.0000 mg | ORAL_TABLET | Freq: Once | ORAL | Status: AC
  Administered 2021-11-04: 650 mg via ORAL

## 2021-11-04 NOTE — Discharge Instructions (Signed)
Positive for influenza  A  You may take Tamiflu twice daily for the next 5 days, if you are able to get this medication from the pharmacy then continue supportive treatment, symptoms will improve as the virus works as well after system whether or not you take this medication    You can take Tylenol and/or Ibuprofen as needed for fever reduction and pain relief.  You may use muscle relaxer at bedtime for additional comfort    For cough: honey 1/2 to 1 teaspoon (you can dilute the honey in water or another fluid).  You can also use guaifenesin and dextromethorphan for cough. You can use a humidifier for chest congestion and cough.  If you don't have a humidifier, you can sit in the bathroom with the hot shower running.      For sore throat: try warm salt water gargles, cepacol lozenges, throat spray, warm tea or water with lemon/honey, popsicles or ice, or OTC cold relief medicine for throat discomfort.   For congestion: take a daily anti-histamine like Zyrtec, Claritin, and a oral decongestant, such as pseudoephedrine.  You can also use Flonase 1-2 sprays in each nostril daily.   It is important to stay hydrated: drink plenty of fluids (water, gatorade/powerade/pedialyte, juices, or teas) to keep your throat moisturized and help further relieve irritation/discomfort.

## 2021-11-04 NOTE — ED Provider Notes (Signed)
[facility]    CSN: [id number] Arrival date & time: 11/04/21  1108      History   Chief Complaint Chief Complaint  Patient presents with   Generalized Body Aches   Fever    [name] is a 51 y.o. male.   Patient presents with fever, sore throat, nonproductive cough and body aches for 1 day.  Fever peaking at 103 at home.  Decreased appetite but tolerating fluids.  Known exposure to the flu.  Has not attempted treatment of symptoms.  History of hyperlipidemia and hypertension.  Past Medical History:  Diagnosis Date   Hyperlipidemia    Hypertension     Patient Active Problem List   Diagnosis Date Noted   Discoloration of eye 11/23/2016   Palpitations 11/23/2016   Dyslipidemia 11/23/2016   Health care maintenance 10/01/2015   Right inguinal hernia 10/27/2014   Hypertriglyceridemia 10/27/2014   Pain in joint, shoulder region 10/27/2014   Essential hypertension, benign 08/26/2014   Dental caries 08/26/2014   Radicular pain in right arm 10/16/2013   Right arm pain 07/19/2013   Other and unspecified hyperlipidemia 05/06/2013    Past Surgical History:  Procedure Laterality Date   HERNIA REPAIR         Home Medications    Prior to Admission medications   Medication Sig Start Date End Date Taking? Authorizing Provider  amLODipine (NORVASC) 10 MG tablet TAKE 1 TABLET (10 MG TOTAL) BY MOUTH DAILY. 06/17/21 11/04/21 Yes McClung, Dionne Bucy, PA-C  rosuvastatin (CRESTOR) 20 MG tablet TAKE 1 TABLET (20 MG TOTAL) BY MOUTH DAILY. 03/31/21 03/31/22 Yes McClung, Dionne Bucy, PA-C  naproxen (NAPROSYN) 500 MG tablet Take 1 tablet (500 mg total) by mouth 2 (two) times daily with a meal as needed for pain Patient not taking: Reported on 10/07/2021 03/31/21 03/31/22  Argentina Donovan, PA-C  omega-3 acid ethyl esters (LOVAZA) 1 g capsule Take 3 capsules (3,000 mg total) by mouth daily. 04/01/21   Argentina Donovan, PA-C    Family History Family History  Problem Relation  Age of Onset   Healthy Mother    Heart failure Father    Diabetes Sister     Social History Social History   Tobacco Use   Smoking status: Never   Smokeless tobacco: Never  Vaping Use   Vaping Use: Never used  Substance Use Topics   Alcohol use: Yes    Comment: occasonally   Drug use: No     Allergies   Shrimp flavor and Other   Review of Systems Review of Systems  Constitutional:  Positive for appetite change and fever. Negative for activity change, chills, diaphoresis, fatigue and unexpected weight change.  HENT:  Positive for sore throat. Negative for congestion, dental problem, drooling, ear discharge, ear pain, facial swelling, hearing loss, mouth sores, nosebleeds, postnasal drip, rhinorrhea, sinus pressure, sinus pain, sneezing, tinnitus, trouble swallowing and voice change.   Respiratory:  Positive for cough. Negative for apnea, choking, chest tightness, shortness of breath, wheezing and stridor.   Cardiovascular: Negative.   Gastrointestinal: Negative.   Musculoskeletal:  Positive for arthralgias and myalgias. Negative for back pain, gait problem, joint swelling, neck pain and neck stiffness.  Skin: Negative.     Physical Exam Triage Vital Signs ED Triage Vitals  Enc Vitals Group     BP 11/04/21 1311 137/87     Pulse Rate 11/04/21 1311 (!) 103     Resp 11/04/21 1311 20     Temp  11/04/21 1311 (!) 103 F (39.4 C)     Temp Source 11/04/21 1311 Oral     SpO2 11/04/21 1311 99 %     Weight --      Height --      Head Circumference --      Peak Flow --      Pain Score 11/04/21 1309 8     Pain Loc --      Pain Edu? --      Excl. in [place]? --    No data found.  Updated Vital Signs BP 137/87 (BP Location: Left Arm)    Pulse (!) 103    Temp (!) 103 F (39.4 C) (Oral)    Resp 20    SpO2 99%   Visual Acuity Right Eye Distance:   Left Eye Distance:   Bilateral Distance:    Right Eye Near:   Left Eye Near:    Bilateral Near:     Physical  Exam Constitutional:      Appearance: Normal appearance. He is normal weight.  HENT:     Head: Normocephalic.     Right Ear: Tympanic membrane, ear canal and external ear normal.     Left Ear: Tympanic membrane, ear canal and external ear normal.     Nose: Congestion present. No rhinorrhea.     Mouth/Throat:     Mouth: Mucous membranes are moist.     Pharynx: Posterior oropharyngeal erythema present.  Eyes:     Extraocular Movements: Extraocular movements intact.  Cardiovascular:     Rate and Rhythm: Normal rate and regular rhythm.     Pulses: Normal pulses.     Heart sounds: Normal heart sounds.  Pulmonary:     Effort: Pulmonary effort is normal.     Breath sounds: Normal breath sounds.  Musculoskeletal:     Cervical back: Normal range of motion.  Lymphadenopathy:     Cervical: Cervical adenopathy present.  Skin:    General: Skin is warm and dry.  Neurological:     Mental Status: He is alert and oriented to person, place, and time. Mental status is at baseline.  Psychiatric:        Mood and Affect: Mood normal.        Behavior: Behavior normal.     UC Treatments / Results  Labs (all labs ordered are listed, but only abnormal results are displayed) Labs Reviewed  POC INFLUENZA A AND B ANTIGEN (URGENT CARE ONLY) - Abnormal; Notable for the following components:      Result Value   INFLUENZA A ANTIGEN, POC POSITIVE (*)    All other components within normal limits    EKG   Radiology No results found.  Procedures Procedures (including critical care time)  Medications Ordered in UC Medications  acetaminophen (TYLENOL) tablet 650 mg (650 mg Oral Given 11/04/21 1319)    Initial Impression / Assessment and Plan / UC Course  I have reviewed the triage vital signs and the nursing notes.  Pertinent labs & imaging results that were available during my care of the patient were reviewed by me and considered in my medical decision making (see chart for  details).  Influenza A  Confirmed by PCR, prescribed Tamiflu for 5-day course, prescribed ibuprofen and Flexeril as body aches are patient's most concerning symptom today, given list of over-the-counter medications and home remedies that he may try in addition, urgent care follow-up as needed Final Clinical Impressions(s) / UC Diagnoses   Final diagnoses:  None   Discharge Instructions   None    ED Prescriptions   None    PDMP not reviewed this encounter.   Hans Eden, NP 11/04/21 1335

## 2021-11-04 NOTE — ED Triage Notes (Addendum)
Patient c/o generalized body aches and fever x 1 day.   Patient endorses a temperature of 103 F at it's highest at home.   Patient endorses recent Flu exposure.   Patient has taken Tylenol (last dose at 0400 today) with no relief of symptoms.

## 2021-12-17 ENCOUNTER — Other Ambulatory Visit: Payer: Self-pay

## 2021-12-21 ENCOUNTER — Other Ambulatory Visit: Payer: Self-pay

## 2022-01-10 ENCOUNTER — Other Ambulatory Visit: Payer: Self-pay | Admitting: Physician Assistant

## 2022-01-10 DIAGNOSIS — E785 Hyperlipidemia, unspecified: Secondary | ICD-10-CM

## 2022-01-11 NOTE — Telephone Encounter (Signed)
Requested medication (s) are due for refill today: Yes ? ?Requested medication (s) are on the active medication list: Yes ? ?Last refill:  03/31/21 ? ?Future visit scheduled: Yes ? ?Notes to clinic:  Protocol indicates pt. Needs lab work. ? ? ? ?Requested Prescriptions  ?Pending Prescriptions Disp Refills  ? rosuvastatin (CRESTOR) 20 MG tablet 30 tablet 5  ?  Sig: TAKE 1 TABLET (20 MG TOTAL) BY MOUTH DAILY.  ?  ? Cardiovascular:  Antilipid - Statins 2 Failed - 01/10/2022 10:52 AM  ?  ?  Failed - Lipid Panel in normal range within the last 12 months  ?  Cholesterol, Total  ?Date Value Ref Range Status  ?03/31/2021 198 100 - 199 mg/dL Final  ? ?LDL Chol Calc (NIH)  ?Date Value Ref Range Status  ?03/31/2021 105 (H) 0 - 99 mg/dL Final  ? ?HDL  ?Date Value Ref Range Status  ?03/31/2021 43 >39 mg/dL Final  ? ?Triglycerides  ?Date Value Ref Range Status  ?03/31/2021 293 (H) 0 - 149 mg/dL Final  ? ?  ?  ?  Passed - Cr in normal range and within 360 days  ?  Creat  ?Date Value Ref Range Status  ?07/28/2016 0.88 0.60 - 1.35 mg/dL Final  ? ?Creatinine, Ser  ?Date Value Ref Range Status  ?05/14/2021 0.87 0.61 - 1.24 mg/dL Final  ? ?Creatinine,U  ?Date Value Ref Range Status  ?06/30/2008 97.0 mg/dL Final  ?  Comment:  ?  See lab report for associated comment(s)  ?  ?  ?  ?  Passed - Patient is not pregnant  ?  ?  Passed - Valid encounter within last 12 months  ?  Recent Outpatient Visits   ? ?      ? 3 months ago Primary hypertension  ? [place], [place] W, NP  ? 6 months ago Dyslipidemia  ? [place], [place], [place]  ? 9 months ago Arthralgia, unspecified joint  ? [facility], [employer], [place]  ? 1 year ago Essential hypertension  ? [place], NP  ? 2 years ago Encounter for screening for diabetes mellitus  ? [place] Gildardo Pounds, NP   ? ?  ?  ?Future Appointments   ? ?        ? In 1 month Gildardo Pounds, NP [place]  ? ?  ? ?  ?  ?  ? ?

## 2022-01-12 ENCOUNTER — Other Ambulatory Visit: Payer: Self-pay | Admitting: Physician Assistant

## 2022-01-12 ENCOUNTER — Other Ambulatory Visit: Payer: Self-pay

## 2022-01-12 DIAGNOSIS — E785 Hyperlipidemia, unspecified: Secondary | ICD-10-CM

## 2022-01-12 MED ORDER — ROSUVASTATIN CALCIUM 20 MG PO TABS
ORAL_TABLET | Freq: Every day | ORAL | 2 refills | Status: DC
  Filled 2022-01-12: qty 30, 30d supply, fill #0
  Filled 2022-03-02: qty 30, 30d supply, fill #1

## 2022-01-12 NOTE — Telephone Encounter (Signed)
Pts daughter called in to let PCP know that the pharmacy told them that the medication would need a PA, please advise.  ?

## 2022-01-13 ENCOUNTER — Other Ambulatory Visit: Payer: Self-pay

## 2022-01-17 ENCOUNTER — Other Ambulatory Visit: Payer: Self-pay

## 2022-02-07 ENCOUNTER — Other Ambulatory Visit: Payer: Self-pay

## 2022-02-09 ENCOUNTER — Other Ambulatory Visit: Payer: Self-pay

## 2022-02-28 ENCOUNTER — Encounter: Payer: Self-pay | Admitting: Nurse Practitioner

## 2022-02-28 ENCOUNTER — Other Ambulatory Visit: Payer: Self-pay

## 2022-02-28 ENCOUNTER — Ambulatory Visit: Payer: Self-pay | Attending: Nurse Practitioner | Admitting: Nurse Practitioner

## 2022-02-28 DIAGNOSIS — M25561 Pain in right knee: Secondary | ICD-10-CM

## 2022-02-28 DIAGNOSIS — G8929 Other chronic pain: Secondary | ICD-10-CM

## 2022-02-28 DIAGNOSIS — Z1159 Encounter for screening for other viral diseases: Secondary | ICD-10-CM

## 2022-02-28 DIAGNOSIS — R7309 Other abnormal glucose: Secondary | ICD-10-CM

## 2022-02-28 DIAGNOSIS — E785 Hyperlipidemia, unspecified: Secondary | ICD-10-CM

## 2022-02-28 DIAGNOSIS — I10 Essential (primary) hypertension: Secondary | ICD-10-CM

## 2022-02-28 MED ORDER — AMLODIPINE BESYLATE 10 MG PO TABS
ORAL_TABLET | Freq: Every day | ORAL | 1 refills | Status: DC
  Filled 2022-02-28: qty 90, fill #0
  Filled 2022-03-11: qty 30, 30d supply, fill #0
  Filled 2022-04-21: qty 30, 30d supply, fill #1
  Filled 2022-05-26: qty 30, 30d supply, fill #2
  Filled 2022-07-01: qty 30, 30d supply, fill #3

## 2022-02-28 MED ORDER — NAPROXEN 500 MG PO TABS
500.0000 mg | ORAL_TABLET | Freq: Two times a day (BID) | ORAL | 0 refills | Status: DC
  Filled 2022-02-28: qty 60, 30d supply, fill #0

## 2022-02-28 NOTE — Progress Notes (Signed)
? ?Assessment & Plan:  ?Shawn Porter was seen today for hypertension. ? ?Diagnoses and all orders for this visit: ? ?Primary hypertension ?-     amLODipine (NORVASC) 10 MG tablet; TAKE 1 TABLET (10 MG TOTAL) BY MOUTH DAILY. ?-     CMP14+EGFR ? ?Chronic pain of right knee ?-     naproxen (NAPROSYN) 500 MG tablet; Take 1 tablet (500 mg total) by mouth 2 (two) times daily with a meal as needed for pain ? ?Elevated glucose ?-     Hemoglobin A1c ? ?Need for hepatitis C screening test ?-     HCV Ab w Reflex to Quant PCR ? ?Dyslipidemia, goal LDL below 100 ?-     Lipid panel ? ? ? ?Patient has been counseled on age-appropriate routine health concerns for screening and prevention. These are reviewed and up-to-date. Referrals have been placed accordingly. Immunizations are up-to-date or declined.    ?Subjective:  ? ?Chief Complaint  ?Patient presents with  ? Hypertension  ? ?HPI ?Shawn Porter 51 y.o. male presents to office today for follow up to HTN. ?VRI was used to communicate directly with patient for the entire encounter including providing detailed patient instructions.  ? ? ?HTN ?Blood pressure is elevated today. He did not take his amlodipine this morning.  ?BP Readings from Last 3 Encounters:  ?02/28/22 (!) 148/98  ?11/04/21 137/87  ?10/07/21 133/90  ?   ? ?Knee pain ?Endorses chronic right knee pain. 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 Pain is worse at night however it occurs infrequently. Described as a tiring sensation. He does wear knee pads at work. Denies swelling and instability. Pain is unrelated to trauma or injury. Taking naproxen 500 mg bid prn.  ? ? ? ?Review of Systems  ?Constitutional:  Negative for fever, malaise/fatigue and weight loss.  ?HENT: Negative.  Negative for nosebleeds.   ?Eyes: Negative.  Negative for blurred vision, double vision and photophobia.  ?Respiratory: Negative.  Negative for cough and shortness of breath.   ?Cardiovascular: Negative.  Negative for chest pain,  palpitations and leg swelling.  ?Gastrointestinal: Negative.  Negative for heartburn, nausea and vomiting.  ?Musculoskeletal:  Positive for joint pain. Negative for myalgias.  ?Neurological: Negative.  Negative for dizziness, focal weakness, seizures and headaches.  ?Psychiatric/Behavioral: Negative.  Negative for suicidal ideas.   ? ?Past Medical History:  ?Diagnosis Date  ? Hyperlipidemia   ? Hypertension   ? ? ?Past Surgical History:  ?Procedure Laterality Date  ? HERNIA REPAIR    ? ? ?Family History  ?Problem Relation Age of Onset  ? Healthy Mother   ? Heart failure Father   ? Diabetes Sister   ? ? ?Social History Reviewed with no changes to be made today.  ? ?Outpatient Medications Prior to Visit  ?Medication Sig Dispense Refill  ? cyclobenzaprine (FLEXERIL) 10 MG tablet Take 1 tablet (10 mg total) by mouth at bedtime. 10 tablet 0  ? omega-3 acid ethyl esters (LOVAZA) 1 g capsule Take 3 capsules (3,000 mg total) by mouth daily. 90 capsule 5  ? rosuvastatin (CRESTOR) 20 MG tablet TAKE 1 TABLET (20 MG TOTAL) BY MOUTH DAILY. 30 tablet 2  ? amLODipine (NORVASC) 10 MG tablet TAKE 1 TABLET (10 MG TOTAL) BY MOUTH DAILY. 90 tablet 1  ? ibuprofen (ADVIL) 800 MG tablet Take 1 tablet (800 mg total) by mouth 3 (three) times daily. 21 tablet 0  ? naproxen (NAPROSYN) 500 MG tablet Take 1 tablet (500 mg  total) by mouth 2 (two) times daily with a meal as needed for pain (Patient not taking: Reported on 10/07/2021) 60 tablet 0  ? oseltamivir (TAMIFLU) 75 MG capsule Take 1 capsule (75 mg total) by mouth every 12 (twelve) hours. (Patient not taking: Reported on 02/28/2022) 10 capsule 0  ? ?No facility-administered medications prior to visit.  ? ? ?Allergies  ?Allergen Reactions  ? Shrimp Flavor   ? Other Rash  ? ? ?   ?Objective:  ?  ?BP (!) 148/98   Pulse 60   Ht 5' 5"  (1.651 m)   Wt 177 lb 12.8 oz (80.6 kg)   SpO2 97%   BMI 29.59 kg/m?  ?Wt Readings from Last 3 Encounters:  ?02/28/22 177 lb 12.8 oz (80.6 kg)  ?10/07/21 168 lb  (76.2 kg)  ?06/17/21 175 lb (79.4 kg)  ? ? ?Physical Exam ?Vitals and nursing note reviewed.  ?Constitutional:   ?   Appearance: He is well-developed.  ?HENT:  ?   Head: Normocephalic and atraumatic.  ?Cardiovascular:  ?   Rate and Rhythm: Normal rate and regular rhythm.  ?   Heart sounds: Normal heart sounds. No murmur heard. ?  No friction rub. No gallop.  ?Pulmonary:  ?   Effort: Pulmonary effort is normal. No tachypnea or respiratory distress.  ?   Breath sounds: Normal breath sounds. No decreased breath sounds, wheezing, rhonchi or rales.  ?Chest:  ?   Chest wall: No tenderness.  ?Abdominal:  ?   General: Bowel sounds are normal.  ?   Palpations: Abdomen is soft.  ?Musculoskeletal:     ?   General: Normal range of motion.  ?   Cervical back: Normal range of motion.  ?   Right knee: No swelling, deformity or crepitus. Normal range of motion. No tenderness. Normal patellar mobility.  ?Skin: ?   General: Skin is warm and dry.  ?Neurological:  ?   Mental Status: He is alert and oriented to person, place, and time.  ?   Coordination: Coordination normal.  ?Psychiatric:     ?   Behavior: Behavior normal. Behavior is cooperative.     ?   Thought Content: Thought content normal.     ?   Judgment: Judgment normal.  ? ? ? ? ?   ?Patient has been counseled extensively about nutrition and exercise as well as the importance of adherence with medications and regular follow-up. The patient was given clear instructions to go to ER or return to medical center if symptoms don't improve, worsen or new problems develop. The patient verbalized understanding.  ? ?Follow-up: Return in about 3 months (around 05/31/2022) for HTN.  ? ?Gildardo Pounds, FNP-BC ?[place] ?[place], [place] ?[phone]   ?02/28/2022, 1:43 PM ?

## 2022-02-28 NOTE — Progress Notes (Signed)
No BP medication this morning. ?

## 2022-03-01 LAB — CMP14+EGFR
ALT: 46 IU/L — ABNORMAL HIGH (ref 0–44)
AST: 27 IU/L (ref 0–40)
Albumin/Globulin Ratio: 2.6 — ABNORMAL HIGH (ref 1.2–2.2)
Albumin: 4.6 g/dL (ref 3.8–4.9)
Alkaline Phosphatase: 75 IU/L (ref 44–121)
BUN/Creatinine Ratio: 18 (ref 9–20)
BUN: 13 mg/dL (ref 6–24)
Bilirubin Total: 1 mg/dL (ref 0.0–1.2)
CO2: 25 mmol/L (ref 20–29)
Calcium: 9.5 mg/dL (ref 8.7–10.2)
Chloride: 105 mmol/L (ref 96–106)
Creatinine, Ser: 0.74 mg/dL — ABNORMAL LOW (ref 0.76–1.27)
Globulin, Total: 1.8 g/dL (ref 1.5–4.5)
Glucose: 111 mg/dL — ABNORMAL HIGH (ref 70–99)
Potassium: 4.2 mmol/L (ref 3.5–5.2)
Sodium: 143 mmol/L (ref 134–144)
Total Protein: 6.4 g/dL (ref 6.0–8.5)
eGFR: 110 mL/min/{1.73_m2} (ref >59)

## 2022-03-01 LAB — LIPID PANEL
Chol/HDL Ratio: 5.5 ratio — ABNORMAL HIGH (ref 0.0–5.0)
Cholesterol, Total: 219 mg/dL — ABNORMAL HIGH (ref 100–199)
HDL: 40 mg/dL (ref >39)
LDL Chol Calc (NIH): 126 mg/dL — ABNORMAL HIGH (ref 0–99)
Triglycerides: 301 mg/dL — ABNORMAL HIGH (ref 0–149)
VLDL Cholesterol Cal: 53 mg/dL — ABNORMAL HIGH (ref 5–40)

## 2022-03-01 LAB — HCV AB W REFLEX TO QUANT PCR: HCV Ab: NONREACTIVE

## 2022-03-01 LAB — HEMOGLOBIN A1C
Est. average glucose Bld gHb Est-mCnc: 103 mg/dL
Hgb A1c MFr Bld: 5.2 % (ref 4.8–5.6)

## 2022-03-01 LAB — HCV INTERPRETATION

## 2022-03-02 ENCOUNTER — Other Ambulatory Visit: Payer: Self-pay | Admitting: Nurse Practitioner

## 2022-03-02 DIAGNOSIS — E785 Hyperlipidemia, unspecified: Secondary | ICD-10-CM

## 2022-03-02 DIAGNOSIS — E781 Pure hyperglyceridemia: Secondary | ICD-10-CM

## 2022-03-02 MED ORDER — OMEGA-3-ACID ETHYL ESTERS 1 G PO CAPS
2.0000 g | ORAL_CAPSULE | Freq: Two times a day (BID) | ORAL | 1 refills | Status: DC
  Filled 2022-03-02: qty 120, 30d supply, fill #0

## 2022-03-02 MED ORDER — ROSUVASTATIN CALCIUM 20 MG PO TABS
ORAL_TABLET | Freq: Every day | ORAL | 1 refills | Status: DC
  Filled 2022-03-02: qty 30, 30d supply, fill #0
  Filled 2022-04-21: qty 30, 30d supply, fill #1
  Filled 2022-05-26: qty 30, 30d supply, fill #2

## 2022-03-03 ENCOUNTER — Other Ambulatory Visit: Payer: Self-pay

## 2022-03-11 ENCOUNTER — Other Ambulatory Visit: Payer: Self-pay

## 2022-04-21 ENCOUNTER — Emergency Department (HOSPITAL_COMMUNITY)
Admission: EM | Admit: 2022-04-21 | Discharge: 2022-04-21 | Disposition: A | Discharge status: Home / Self Care | Payer: Self-pay | Attending: Emergency Medicine | Admitting: Emergency Medicine

## 2022-04-21 ENCOUNTER — Emergency Department (HOSPITAL_COMMUNITY): Payer: Self-pay

## 2022-04-21 ENCOUNTER — Encounter (HOSPITAL_COMMUNITY): Payer: Self-pay | Admitting: *Deleted

## 2022-04-21 ENCOUNTER — Other Ambulatory Visit: Payer: Self-pay

## 2022-04-21 ENCOUNTER — Encounter (HOSPITAL_COMMUNITY): Payer: Self-pay | Admitting: Emergency Medicine

## 2022-04-21 ENCOUNTER — Ambulatory Visit (HOSPITAL_COMMUNITY)
Admission: EM | Admit: 2022-04-21 | Discharge: 2022-04-21 | Disposition: A | Discharge status: Home / Self Care | Payer: Self-pay

## 2022-04-21 DIAGNOSIS — R198 Other specified symptoms and signs involving the digestive system and abdomen: Secondary | ICD-10-CM

## 2022-04-21 DIAGNOSIS — R197 Diarrhea, unspecified: Secondary | ICD-10-CM

## 2022-04-21 DIAGNOSIS — R1013 Epigastric pain: Secondary | ICD-10-CM | POA: Insufficient documentation

## 2022-04-21 DIAGNOSIS — R112 Nausea with vomiting, unspecified: Secondary | ICD-10-CM

## 2022-04-21 DIAGNOSIS — R1084 Generalized abdominal pain: Secondary | ICD-10-CM

## 2022-04-21 DIAGNOSIS — I10 Essential (primary) hypertension: Secondary | ICD-10-CM | POA: Insufficient documentation

## 2022-04-21 DIAGNOSIS — M545 Low back pain, unspecified: Secondary | ICD-10-CM

## 2022-04-21 LAB — CBC
HCT: 47.9 % (ref 39.0–52.0)
Hemoglobin: 17.7 g/dL — ABNORMAL HIGH (ref 13.0–17.0)
MCH: 34.5 pg — ABNORMAL HIGH (ref 26.0–34.0)
MCHC: 37 g/dL — ABNORMAL HIGH (ref 30.0–36.0)
MCV: 93.4 fL (ref 80.0–100.0)
Platelets: 176 10*3/uL (ref 150–400)
RBC: 5.13 MIL/uL (ref 4.22–5.81)
RDW: 12.3 % (ref 11.5–15.5)
WBC: 5.1 10*3/uL (ref 4.0–10.5)
nRBC: 0 % (ref 0.0–0.2)

## 2022-04-21 LAB — COMPREHENSIVE METABOLIC PANEL
ALT: 47 U/L — ABNORMAL HIGH (ref 0–44)
AST: 30 U/L (ref 15–41)
Albumin: 4.1 g/dL (ref 3.5–5.0)
Alkaline Phosphatase: 55 U/L (ref 38–126)
Anion gap: 7 (ref 5–15)
BUN: 11 mg/dL (ref 6–20)
CO2: 24 mmol/L (ref 22–32)
Calcium: 9 mg/dL (ref 8.9–10.3)
Chloride: 107 mmol/L (ref 98–111)
Creatinine, Ser: 0.81 mg/dL (ref 0.61–1.24)
GFR, Estimated: >60 mL/min (ref >60)
Glucose, Bld: 139 mg/dL — ABNORMAL HIGH (ref 70–99)
Potassium: 4 mmol/L (ref 3.5–5.1)
Sodium: 138 mmol/L (ref 135–145)
Total Bilirubin: 1.5 mg/dL — ABNORMAL HIGH (ref 0.3–1.2)
Total Protein: 6.7 g/dL (ref 6.5–8.1)

## 2022-04-21 LAB — URINALYSIS, ROUTINE W REFLEX MICROSCOPIC
Bacteria, UA: NONE SEEN
Bilirubin Urine: NEGATIVE
Glucose, UA: NEGATIVE mg/dL
Ketones, ur: NEGATIVE mg/dL
Leukocytes,Ua: NEGATIVE
Nitrite: NEGATIVE
Protein, ur: NEGATIVE mg/dL
Specific Gravity, Urine: 1.008 (ref 1.005–1.030)
pH: 6 (ref 5.0–8.0)

## 2022-04-21 LAB — LIPASE, BLOOD: Lipase: 34 U/L (ref 11–51)

## 2022-04-21 MED ORDER — IOHEXOL 350 MG/ML SOLN
100.0000 mL | Freq: Once | INTRAVENOUS | Status: AC | PRN
  Administered 2022-04-21: 100 mL via INTRAVENOUS

## 2022-04-21 MED ORDER — DICYCLOMINE HCL 20 MG PO TABS
20.0000 mg | ORAL_TABLET | Freq: Three times a day (TID) | ORAL | 0 refills | Status: DC | PRN
Start: 2022-04-21 — End: 2022-07-01
  Filled 2022-04-21: qty 20, 7d supply, fill #0

## 2022-04-21 MED ORDER — ONDANSETRON HCL 4 MG/2ML IJ SOLN
4.0000 mg | Freq: Once | INTRAMUSCULAR | Status: AC
  Administered 2022-04-21: 4 mg via INTRAVENOUS
  Filled 2022-04-21: qty 2

## 2022-04-21 MED ORDER — SODIUM CHLORIDE 0.9 % IV BOLUS
1000.0000 mL | Freq: Once | INTRAVENOUS | Status: AC
  Administered 2022-04-21: 1000 mL via INTRAVENOUS

## 2022-04-21 MED ORDER — NAPROXEN 500 MG PO TABS
500.0000 mg | ORAL_TABLET | Freq: Two times a day (BID) | ORAL | 0 refills | Status: DC | PRN
  Filled 2022-04-21: qty 14, 7d supply, fill #0

## 2022-04-21 MED ORDER — ONDANSETRON 4 MG PO TBDP
4.0000 mg | ORAL_TABLET | Freq: Three times a day (TID) | ORAL | 0 refills | Status: DC | PRN
  Filled 2022-04-21: qty 20, 7d supply, fill #0

## 2022-04-21 MED ORDER — ALUM & MAG HYDROXIDE-SIMETH 200-200-20 MG/5ML PO SUSP: 15.0000 mL | Freq: Once | ORAL | Status: DC

## 2022-04-21 MED ORDER — KETOROLAC TROMETHAMINE 30 MG/ML IJ SOLN
30.0000 mg | Freq: Once | INTRAMUSCULAR | Status: AC
  Administered 2022-04-21: 30 mg via INTRAVENOUS
  Filled 2022-04-21: qty 1

## 2022-04-21 MED ORDER — LOPERAMIDE HCL 2 MG PO CAPS
2.0000 mg | ORAL_CAPSULE | Freq: Four times a day (QID) | ORAL | 0 refills | Status: DC | PRN
  Filled 2022-04-21: qty 12, 3d supply, fill #0

## 2022-04-21 NOTE — ED Notes (Signed)
Patient is being discharged from the Urgent Care and sent to the Emergency Department via POV . Per Junie Panning, [place], patient is in need of higher level of care due to further evaluation. Patient is aware and verbalizes understanding of plan of care.  Vitals:   04/21/22 1123  BP: (!) 142/95  Pulse: 82  Resp: 20  Temp: 99 F (37.2 C)  SpO2: 96%

## 2022-04-21 NOTE — ED Provider Triage Note (Cosign Needed)
Emergency Medicine Provider Triage Evaluation Note  Shawn Porter , a 51 y.o. male  was evaluated in triage.  Pt complains of upper abdominal pain bilaterally over the past 3 days, gradually worsening.  He has had vomiting over the past 2 days.  Pain radiates to the back.  He does endorse occasional alcohol use.  No chest pain or shortness of breath.  He has had diarrhea.  Seen at urgent care prior to arrival and referred to the emergency department.  Review of Systems  Positive: Abdominal pain, vomiting, back pain Negative: Fever  Physical Exam  BP (!) 157/102 (BP Location: Right Arm)   Pulse 89   Temp 98.8 F (37.1 C) (Oral)   Resp (!) 22   SpO2 98%  Gen:   Awake, no distress   Resp:  Normal effort  MSK:   Moves extremities without difficulty  Other:  Mild-moderate bilateral upper abdominal pain without rebound  Medical Decision Making  Medically screening exam initiated at 12:09 PM.  Appropriate orders placed.  Shawn Porter was informed that the remainder of the evaluation will be completed by another provider, this initial triage assessment does not replace that evaluation, and the importance of remaining in the ED until their evaluation is complete.     Carlisle Cater, PA-C 04/21/22 1210

## 2022-04-21 NOTE — ED Notes (Signed)
Patient verbalizes understanding of discharge instructions. Opportunity for questioning and answers were provided. Armband removed by staff, pt discharged from ED.  

## 2022-04-21 NOTE — ED Provider Notes (Signed)
[facility]    CSN: [id number] Arrival date & time: 04/21/22  1108      History   Chief Complaint Chief Complaint  Patient presents with   Abdominal Pain   Back Pain    [name] is a 51 y.o. male.   Patient presents today with a 3-day history of worsening abdominal pain.  He reports pain is rated 8/9 on a 0-10 pain scale, generalized throughout abdomen, described as sharp, no aggravating or alleviating factors identified.  He reports associated nausea and vomiting as well as diarrhea.  Denies any melena, hematochezia, hematemesis.  He has tried Pepto-Bismol tablets without improvement.  Denies any known sick contacts, suspicious food intake, recent medication changes, antibiotic use, recent travel.  Denies history of gastrointestinal disorder.  Denies previous abdominal surgery.  He has also developed lower back pain but denies any urinary symptoms including frequency, urgency, hematuria.  He denies any fever, cough, congestion, sore throat.    Past Medical History:  Diagnosis Date   Hyperlipidemia    Hypertension     Patient Active Problem List   Diagnosis Date Noted   Discoloration of eye 11/23/2016   Palpitations 11/23/2016   Dyslipidemia 11/23/2016   Health care maintenance 10/01/2015   Right inguinal hernia 10/27/2014   Hypertriglyceridemia 10/27/2014   Pain in joint, shoulder region 10/27/2014   Essential hypertension, benign 08/26/2014   Dental caries 08/26/2014   Radicular pain in right arm 10/16/2013   Right arm pain 07/19/2013   Other and unspecified hyperlipidemia 05/06/2013    Past Surgical History:  Procedure Laterality Date   HERNIA REPAIR         Home Medications    Prior to Admission medications   Medication Sig Start Date End Date Taking? Authorizing Provider  amLODipine (NORVASC) 10 MG tablet TAKE 1 TABLET (10 MG TOTAL) BY MOUTH DAILY. 02/28/22 05/29/22 Yes Gildardo Pounds, NP  rosuvastatin (CRESTOR) 20 MG tablet TAKE  1 TABLET (20 MG TOTAL) BY MOUTH DAILY. 03/02/22 08/29/22 Yes Gildardo Pounds, NP  cyclobenzaprine (FLEXERIL) 10 MG tablet Take 1 tablet (10 mg total) by mouth at bedtime. 11/04/21   Hans Eden, NP  naproxen (NAPROSYN) 500 MG tablet Take 1 tablet (500 mg total) by mouth 2 (two) times daily with a meal as needed for pain 02/28/22 02/28/23  Gildardo Pounds, NP  omega-3 acid ethyl esters (LOVAZA) 1 g capsule Take 2 capsules (2 g total) by mouth 2 (two) times daily. 03/02/22 08/29/22  Gildardo Pounds, NP    Family History Family History  Problem Relation Age of Onset   Healthy Mother    Heart failure Father    Diabetes Sister     Social History Social History   Tobacco Use   Smoking status: Never   Smokeless tobacco: Never  Vaping Use   Vaping Use: Never used  Substance Use Topics   Alcohol use: Yes    Comment: occasionally   Drug use: No     Allergies   Shrimp flavor and Other   Review of Systems Review of Systems  Constitutional:  Positive for activity change. Negative for appetite change, fatigue and fever.  HENT:  Negative for congestion and sore throat.   Respiratory:  Negative for cough and shortness of breath.   Cardiovascular:  Negative for chest pain.  Gastrointestinal:  Positive for abdominal pain, diarrhea, nausea and vomiting.  Neurological:  Negative for dizziness, light-headedness and headaches.     Physical Exam  Triage Vital Signs ED Triage Vitals  Enc Vitals Group     BP 04/21/22 1123 (!) 142/95     Pulse Rate 04/21/22 1123 82     Resp 04/21/22 1123 20     Temp 04/21/22 1123 99 F (37.2 C)     Temp Source 04/21/22 1123 Oral     SpO2 04/21/22 1123 96 %     Weight --      Height --      Head Circumference --      Peak Flow --      Pain Score 04/21/22 1124 9     Pain Loc --      Pain Edu? --      Excl. in [place]? --    No data found.  Updated Vital Signs BP (!) 142/95   Pulse 82   Temp 99 F (37.2 C) (Oral)   Resp 20   SpO2 96%   Visual  Acuity Right Eye Distance:   Left Eye Distance:   Bilateral Distance:    Right Eye Near:   Left Eye Near:    Bilateral Near:     Physical Exam Vitals reviewed.  Constitutional:      General: He is awake.     Appearance: Normal appearance. He is well-developed. He is not ill-appearing.     Comments: Pleasant male appears stated age sitting hunched over in exam room obviously uncomfortable but in no acute distress  HENT:     Head: Normocephalic and atraumatic.  Cardiovascular:     Rate and Rhythm: Normal rate and regular rhythm.     Heart sounds: Normal heart sounds, S1 normal and S2 normal. No murmur heard. Pulmonary:     Effort: Pulmonary effort is normal.     Breath sounds: Normal breath sounds. No stridor. No wheezing, rhonchi or rales.     Comments: Clear to auscultation bilaterally Abdominal:     General: Bowel sounds are normal.     Palpations: Abdomen is soft.     Tenderness: There is abdominal tenderness. There is guarding. There is no right CVA tenderness, left CVA tenderness or rebound.     Comments: Guarding throughout abdomen  Musculoskeletal:     Cervical back: No tenderness or bony tenderness.     Thoracic back: No tenderness or bony tenderness.     Lumbar back: Tenderness present. No bony tenderness.  Neurological:     Mental Status: He is alert.  Psychiatric:        Behavior: Behavior is cooperative.      UC Treatments / Results  Labs (all labs ordered are listed, but only abnormal results are displayed) Labs Reviewed - No data to display  EKG   Radiology No results found.  Procedures Procedures (including critical care time)  Medications Ordered in UC Medications - No data to display  Initial Impression / Assessment and Plan / UC Course  I have reviewed the triage vital signs and the nursing notes.  Pertinent labs & imaging results that were available during my care of the patient were reviewed by me and considered in my medical decision  making (see chart for details).     Given worsening symptoms, severity of abdominal pain, guarding on exam recommended that he go to the emergency room for further evaluation and management since we do not have CT abilities in urgent care.  Patient is agreeable and will go directly to [facility], ER for further evaluation and management.  Vital signs are  stable at time of discharge and he was safe for private transport by private vehicle.  Final Clinical Impressions(s) / UC Diagnoses   Final diagnoses:  Abdominal guarding  Generalized abdominal pain  Nausea vomiting and diarrhea     Discharge Instructions      Please go to the emergency room for further evaluation and management.     ED Prescriptions   None    PDMP not reviewed this encounter.   Terrilee Croak, PA-C 04/21/22 1139

## 2022-04-21 NOTE — ED Triage Notes (Signed)
Patient here with complaint of abdominal pain, vomiting, and diarrhea that started three days ago. Patient denies shortness of breath and denies dizziness. Patient is alert, oriented, ambulatory, and in no apparent distress at this time.

## 2022-04-21 NOTE — ED Triage Notes (Signed)
C/O intermittent mid-abd pain over past 3 days, along with constant low back pain. Denies injury. Pt ambulates with guarded posture - states pain in abd and back worse with ambulation. C/O vomiting over past 2 days, along with diarrhea.

## 2022-04-21 NOTE — Discharge Instructions (Signed)
Please go to the emergency room for further evaluation and management. 

## 2022-04-22 ENCOUNTER — Other Ambulatory Visit: Payer: Self-pay

## 2022-05-02 ENCOUNTER — Encounter: Payer: Self-pay | Admitting: Nurse Practitioner

## 2022-05-02 ENCOUNTER — Ambulatory Visit: Payer: Self-pay | Attending: Nurse Practitioner | Admitting: Nurse Practitioner

## 2022-05-02 ENCOUNTER — Other Ambulatory Visit: Payer: Self-pay

## 2022-05-02 VITALS — BP 134/89 | HR 72 | Temp 98.4°F | Resp 16 | Wt 176.0 lb

## 2022-05-02 DIAGNOSIS — R1013 Epigastric pain: Secondary | ICD-10-CM

## 2022-05-02 MED ORDER — TRAMADOL HCL 50 MG PO TABS
50.0000 mg | ORAL_TABLET | Freq: Two times a day (BID) | ORAL | 0 refills | Status: DC | PRN
  Filled 2022-05-02: qty 30, 15d supply, fill #0

## 2022-05-02 NOTE — Progress Notes (Signed)
Assessment & Plan:  Shawn Porter was seen today for abdominal pain.  Diagnoses and all orders for this visit:  Epigastric pain -     H. pylori breath test -     traMADol (ULTRAM) 50 MG tablet; Take 1 tablet (50 mg total) by mouth every 12 (twelve) hours as needed.    Patient has been counseled on age-appropriate routine health concerns for screening and prevention. These are reviewed and up-to-date. Referrals have been placed accordingly. Immunizations are up-to-date or declined.    Subjective:   Chief Complaint  Patient presents with   Abdominal Pain   [name] 51 y.o. male presents to office today with complaints of acute abdominal pain. Accompanied by his wife.    VRI was used to communicate directly with patient for the entire encounter including providing detailed patient instructions.    Was evaluated in the ED on 04-21-2022 with complaints of several days of epigastric abdominal pain with vomiting, diarrhea and low back pain over the past 3 days prior to arrival. Denied fever, dysuria, hesitancy or urgency. CBC, liver enzymes normal. UA dip small amount of blood. T bili slightly elevated at 1.5. There was low suspicion for chronic cholecystitis and no focal tenderness over the gallbladder. Treated with IVFs, zofran and pain medication. Discharged home with bentyl, imodium, naproxen and zofran.   CT ABD/PELVIS with CONTRAST  IMPRESSION: There is no evidence of intestinal obstruction or pneumoperitoneum. Appendix is unremarkable. There is no hydronephrosis. There is mild mucosal enhancement in the stomach which may be due to incomplete distention or suggest nonspecific gastritis. There is mild wall thickening and mucosal enhancement in the distal ileum and ascending colon. Please correlate for possible enterocolitis. There are few nonobstructing left renal stones. Fatty liver. Mild wall thickening in gallbladder may be due to contracted state or suggest chronic  cholecystitis. Small hiatal hernia.  TODAY: Denies diarrhea, constipation, nausea and vomiting but still with abdominal pain. Will check for Hpylori however he has eaten today. So will defer for later today or tomorrow. Last normal BM was last night. Pain unrelated to food intake and or not eating.  Dicyclomine is not helping. He is taking naproxen with no relief.  Pain is right sided in the abdomen adjacent to the umbilicus. He feels there is a palpable "ball" there which he states was also present in the emergency room. I am unable to appreciate any masses on physical exam however there is palpable tenderness and muscle rigidity in the area.  BP Readings from Last 3 Encounters:  05/02/22 134/89  04/21/22 130/88  04/21/22 (!) 142/95    Review of Systems  Constitutional:  Negative for fever, malaise/fatigue and weight loss.  HENT: Negative.  Negative for nosebleeds.   Eyes: Negative.  Negative for blurred vision, double vision and photophobia.  Respiratory: Negative.  Negative for cough and shortness of breath.   Cardiovascular: Negative.  Negative for chest pain, palpitations and leg swelling.  Gastrointestinal:  Positive for abdominal pain. Negative for blood in stool, constipation, diarrhea, heartburn, melena, nausea and vomiting.  Musculoskeletal: Negative.  Negative for myalgias.  Neurological: Negative.  Negative for dizziness, focal weakness, seizures and headaches.  Psychiatric/Behavioral: Negative.  Negative for suicidal ideas.     Past Medical History:  Diagnosis Date   Hyperlipidemia    Hypertension     Past Surgical History:  Procedure Laterality Date   HERNIA REPAIR      Family History  Problem Relation Age of Onset  Healthy Mother    Heart failure Father    Diabetes Sister     Social History Reviewed with no changes to be made today.   Outpatient Medications Prior to Visit  Medication Sig Dispense Refill   amLODipine (NORVASC) 10 MG tablet TAKE 1 TABLET (10  MG TOTAL) BY MOUTH DAILY. 90 tablet 1   cyclobenzaprine (FLEXERIL) 10 MG tablet Take 1 tablet (10 mg total) by mouth at bedtime. 10 tablet 0   dicyclomine (BENTYL) 20 MG tablet Take 1 tablet (20 mg total) by mouth 3 (three) times daily as needed for spasms (abdominal cramping). 20 tablet 0   loperamide (IMODIUM) 2 MG capsule Take 1 capsule (2 mg total) by mouth 4 (four) times daily as needed for diarrhea or loose stools. 12 capsule 0   omega-3 acid ethyl esters (LOVAZA) 1 g capsule Take 2 capsules (2 g total) by mouth 2 (two) times daily. 360 capsule 1   ondansetron (ZOFRAN-ODT) 4 MG disintegrating tablet Take 1 tablet (4 mg total) by mouth every 8 (eight) hours as needed for nausea or vomiting. 20 tablet 0   rosuvastatin (CRESTOR) 20 MG tablet TAKE 1 TABLET (20 MG TOTAL) BY MOUTH DAILY. 90 tablet 1   naproxen (NAPROSYN) 500 MG tablet Take 1 tablet (500 mg total) by mouth 2 (two) times daily as needed for moderate pain. 14 tablet 0   No facility-administered medications prior to visit.    Allergies  Allergen Reactions   Shrimp Flavor    Other Rash       Objective:    BP 134/89 (BP Location: Left Arm, Patient Position: Sitting, Cuff Size: Normal)   Pulse 72   Temp 98.4 F (36.9 C) (Oral)   Resp 16   Wt 176 lb (79.8 kg)   SpO2 97%   BMI 29.29 kg/m  Wt Readings from Last 3 Encounters:  05/02/22 176 lb (79.8 kg)  02/28/22 177 lb 12.8 oz (80.6 kg)  10/07/21 168 lb (76.2 kg)    Physical Exam Vitals and nursing note reviewed.  Constitutional:      Appearance: He is well-developed.  HENT:     Head: Normocephalic and atraumatic.  Cardiovascular:     Rate and Rhythm: Normal rate and regular rhythm.     Heart sounds: Normal heart sounds. No murmur heard.    No friction rub. No gallop.  Pulmonary:     Effort: Pulmonary effort is normal. No tachypnea or respiratory distress.     Breath sounds: Normal breath sounds. No decreased breath sounds, wheezing, rhonchi or rales.  Chest:      Chest wall: No tenderness.  Abdominal:     General: Bowel sounds are normal.     Palpations: Abdomen is soft. There is no mass or pulsatile mass.     Tenderness: There is abdominal tenderness in the right upper quadrant, right lower quadrant and periumbilical area.    Musculoskeletal:        General: Normal range of motion.     Cervical back: Normal range of motion.  Skin:    General: Skin is warm and dry.  Neurological:     Mental Status: He is alert and oriented to person, place, and time.     Coordination: Coordination normal.  Psychiatric:        Behavior: Behavior normal. Behavior is cooperative.        Thought Content: Thought content normal.        Judgment: Judgment normal.  Patient has been counseled extensively about nutrition and exercise as well as the importance of adherence with medications and regular follow-up. The patient was given clear instructions to go to ER or return to medical center if symptoms don't improve, worsen or new problems develop. The patient verbalized understanding.   Follow-up: Return if symptoms worsen or fail to improve.   Gildardo Pounds, FNP-BC [facility] and [place], [place]   05/02/2022, 1:23 PM

## 2022-05-02 NOTE — Progress Notes (Signed)
Continuous abdominal pain.

## 2022-05-04 ENCOUNTER — Other Ambulatory Visit: Payer: Self-pay | Admitting: Nurse Practitioner

## 2022-05-04 ENCOUNTER — Other Ambulatory Visit: Payer: Self-pay

## 2022-05-04 ENCOUNTER — Ambulatory Visit: Payer: Self-pay | Attending: Nurse Practitioner

## 2022-05-04 DIAGNOSIS — R1013 Epigastric pain: Secondary | ICD-10-CM

## 2022-05-05 ENCOUNTER — Ambulatory Visit: Payer: Self-pay | Attending: Nurse Practitioner

## 2022-05-05 DIAGNOSIS — R1013 Epigastric pain: Secondary | ICD-10-CM

## 2022-05-06 LAB — H. PYLORI BREATH TEST: H pylori Breath Test: POSITIVE — AB

## 2022-05-09 ENCOUNTER — Other Ambulatory Visit: Payer: Self-pay | Admitting: Nurse Practitioner

## 2022-05-09 ENCOUNTER — Other Ambulatory Visit: Payer: Self-pay

## 2022-05-09 DIAGNOSIS — A048 Other specified bacterial intestinal infections: Secondary | ICD-10-CM

## 2022-05-09 MED ORDER — METRONIDAZOLE 500 MG PO TABS
500.0000 mg | ORAL_TABLET | Freq: Two times a day (BID) | ORAL | 0 refills | Status: AC
  Filled 2022-05-09: qty 28, 14d supply, fill #0

## 2022-05-09 MED ORDER — AMOXICILLIN 500 MG PO CAPS
1000.0000 mg | ORAL_CAPSULE | Freq: Two times a day (BID) | ORAL | 0 refills | Status: AC
  Filled 2022-05-09: qty 56, 14d supply, fill #0

## 2022-05-09 MED ORDER — CLARITHROMYCIN 500 MG PO TABS
500.0000 mg | ORAL_TABLET | Freq: Two times a day (BID) | ORAL | 0 refills | Status: AC
  Filled 2022-05-09: qty 28, 14d supply, fill #0

## 2022-05-09 MED ORDER — PANTOPRAZOLE SODIUM 40 MG PO TBEC
40.0000 mg | DELAYED_RELEASE_TABLET | Freq: Two times a day (BID) | ORAL | 0 refills | Status: DC
  Filled 2022-05-09: qty 28, 14d supply, fill #0

## 2022-05-09 NOTE — Progress Notes (Signed)
  Treating H pylori today   PPI pantoprazole 40 mg bid),   twice-daily clarithromycin (500 mg),  amoxicillin (1 g bid), and  metronidazole 500 mg bid for 10 to 14 days.

## 2022-05-26 ENCOUNTER — Other Ambulatory Visit: Payer: Self-pay

## 2022-05-30 ENCOUNTER — Other Ambulatory Visit: Payer: Self-pay

## 2022-07-01 ENCOUNTER — Other Ambulatory Visit: Payer: Self-pay

## 2022-07-01 ENCOUNTER — Ambulatory Visit: Payer: Self-pay | Attending: Nurse Practitioner | Admitting: Nurse Practitioner

## 2022-07-01 ENCOUNTER — Encounter: Payer: Self-pay | Admitting: Nurse Practitioner

## 2022-07-01 VITALS — BP 134/84 | HR 62 | Temp 98.0°F | Ht 65.0 in | Wt 175.2 lb

## 2022-07-01 DIAGNOSIS — Z1211 Encounter for screening for malignant neoplasm of colon: Secondary | ICD-10-CM

## 2022-07-01 DIAGNOSIS — E782 Mixed hyperlipidemia: Secondary | ICD-10-CM

## 2022-07-01 DIAGNOSIS — A048 Other specified bacterial intestinal infections: Secondary | ICD-10-CM

## 2022-07-01 DIAGNOSIS — I10 Essential (primary) hypertension: Secondary | ICD-10-CM

## 2022-07-01 MED ORDER — OMEGA-3-ACID ETHYL ESTERS 1 G PO CAPS
2.0000 g | ORAL_CAPSULE | Freq: Two times a day (BID) | ORAL | 1 refills | Status: DC
  Filled 2022-07-01: qty 120, 30d supply, fill #0
  Filled 2022-12-02: qty 120, 30d supply, fill #1

## 2022-07-01 MED ORDER — ROSUVASTATIN CALCIUM 20 MG PO TABS
ORAL_TABLET | Freq: Every day | ORAL | 1 refills | Status: DC
  Filled 2022-07-01: qty 30, 30d supply, fill #0
  Filled 2022-08-19: qty 30, 30d supply, fill #1
  Filled 2022-11-11: qty 30, 30d supply, fill #2
  Filled 2022-12-12: qty 90, 90d supply, fill #3

## 2022-07-01 MED ORDER — AMLODIPINE BESYLATE 10 MG PO TABS
10.0000 mg | ORAL_TABLET | Freq: Every day | ORAL | 1 refills | Status: DC
  Filled 2022-07-01 – 2022-08-19 (×2): qty 90, 90d supply, fill #0
  Filled 2022-12-12: qty 90, 90d supply, fill #1

## 2022-07-01 NOTE — Progress Notes (Signed)
Assessment & Plan:  Idus was seen today for hypertension.  Diagnoses and all orders for this visit:  Primary hypertension -     amLODipine (NORVASC) 10 MG tablet; Take 1 tablet (10 mg total) by mouth daily. Continue amlodipine as prescribed.  Reminded to bring in blood pressure log for follow  up appointment.  RECOMMENDATIONS: DASH/Mediterranean Diets are healthier choices for HTN.    Colon cancer screening -     Fecal occult blood, imunochemical  H. pylori infection -     H. pylori breath test  Mixed hypercholesterolemia and hypertriglyceridemia -     omega-3 acid ethyl esters (LOVAZA) 1 g capsule; Take 2 capsules (2 g total) by mouth 2 (two) times daily. -     rosuvastatin (CRESTOR) 20 MG tablet; TAKE 1 TABLET (20 MG TOTAL) BY MOUTH DAILY. INSTRUCTIONS: Work on a low fat, heart healthy diet and participate in regular aerobic exercise program by working out at least 150 minutes per week; 5 days a week-30 minutes per day. Avoid red meat/beef/steak,  fried foods. junk foods, sodas, sugary drinks, unhealthy snacking, alcohol and smoking.  Drink at least 80 oz of water per day and monitor your carbohydrate intake daily.      Patient has been counseled on age-appropriate routine health concerns for screening and prevention. These are reviewed and up-to-date. Referrals have been placed accordingly. Immunizations are up-to-date or declined.    Subjective:   Chief Complaint  Patient presents with   Hypertension   Hypertension Pertinent negatives include no blurred vision, chest pain, headaches, malaise/fatigue, palpitations or shortness of breath.   Shawn Porter 51 y.o. male presents to office today for follow up to HTN  He has a past medical history of Hyperlipidemia and Hypertension.   HTN Blood pressure is well controlled with amlodipine 10 mg daily.  BP Readings from Last 3 Encounters:  07/01/22 134/84  05/02/22 134/89  04/21/22 130/88     HPL Not at goal with  crestor 20 mg daily and omega 3  Lab Results  Component Value Date   LDLCALC 126 (H) 02/28/2022    Hpylori He was diagnosed with h pylori 05-05-2022. He was treated with triple therapy and endorses adherence. Will repeat h pylori breath test today   Review of Systems  Constitutional:  Negative for fever, malaise/fatigue and weight loss.  HENT: Negative.  Negative for nosebleeds.   Eyes: Negative.  Negative for blurred vision, double vision and photophobia.  Respiratory: Negative.  Negative for cough and shortness of breath.   Cardiovascular: Negative.  Negative for chest pain, palpitations and leg swelling.  Gastrointestinal: Negative.  Negative for heartburn, nausea and vomiting.  Genitourinary:  Negative for urgency.  Musculoskeletal: Negative.  Negative for myalgias.  Neurological: Negative.  Negative for dizziness, focal weakness, seizures and headaches.  Psychiatric/Behavioral: Negative.  Negative for suicidal ideas.     Past Medical History:  Diagnosis Date   Hyperlipidemia    Hypertension     Past Surgical History:  Procedure Laterality Date   HERNIA REPAIR      Family History  Problem Relation Age of Onset   Healthy Mother    Heart failure Father    Diabetes Sister     Social History Reviewed with no changes to be made today.   Outpatient Medications Prior to Visit  Medication Sig Dispense Refill   amLODipine (NORVASC) 10 MG tablet TAKE 1 TABLET (10 MG TOTAL) BY MOUTH DAILY. 90 tablet 1   omega-3 acid ethyl  esters (LOVAZA) 1 g capsule Take 2 capsules (2 g total) by mouth 2 (two) times daily. 360 capsule 1   rosuvastatin (CRESTOR) 20 MG tablet TAKE 1 TABLET (20 MG TOTAL) BY MOUTH DAILY. 90 tablet 1   cyclobenzaprine (FLEXERIL) 10 MG tablet Take 1 tablet (10 mg total) by mouth at bedtime. (Patient not taking: Reported on 07/01/2022) 10 tablet 0   dicyclomine (BENTYL) 20 MG tablet Take 1 tablet (20 mg total) by mouth 3 (three) times daily as needed for spasms  (abdominal cramping). (Patient not taking: Reported on 07/01/2022) 20 tablet 0   loperamide (IMODIUM) 2 MG capsule Take 1 capsule (2 mg total) by mouth 4 (four) times daily as needed for diarrhea or loose stools. (Patient not taking: Reported on 07/01/2022) 12 capsule 0   ondansetron (ZOFRAN-ODT) 4 MG disintegrating tablet Take 1 tablet (4 mg total) by mouth every 8 (eight) hours as needed for nausea or vomiting. (Patient not taking: Reported on 07/01/2022) 20 tablet 0   pantoprazole (PROTONIX) 40 MG tablet Take 1 tablet (40 mg total) by mouth 2 (two) times daily for 14 days. 28 tablet 0   traMADol (ULTRAM) 50 MG tablet Take 1 tablet (50 mg total) by mouth every 12 (twelve) hours as needed. (Patient not taking: Reported on 07/01/2022) 30 tablet 0   No facility-administered medications prior to visit.    Allergies  Allergen Reactions   Shrimp Flavor    Other Rash       Objective:    BP 134/84   Pulse 62   Temp 98 F (36.7 C) (Oral)   Ht '5\' 5"'$  (1.651 m)   Wt 175 lb 3.2 oz (79.5 kg)   SpO2 98%   BMI 29.15 kg/m  Wt Readings from Last 3 Encounters:  07/01/22 175 lb 3.2 oz (79.5 kg)  05/02/22 176 lb (79.8 kg)  02/28/22 177 lb 12.8 oz (80.6 kg)    Physical Exam Vitals and nursing note reviewed.  Constitutional:      Appearance: He is well-developed.  HENT:     Head: Normocephalic and atraumatic.  Cardiovascular:     Rate and Rhythm: Normal rate and regular rhythm.     Heart sounds: Normal heart sounds. No murmur heard.    No friction rub. No gallop.  Pulmonary:     Effort: Pulmonary effort is normal. No tachypnea or respiratory distress.     Breath sounds: Normal breath sounds. No decreased breath sounds, wheezing, rhonchi or rales.  Chest:     Chest wall: No tenderness.  Abdominal:     General: Bowel sounds are normal.     Palpations: Abdomen is soft.  Musculoskeletal:        General: Normal range of motion.     Cervical back: Normal range of motion.  Skin:    General: Skin  is warm and dry.  Neurological:     Mental Status: He is alert and oriented to person, place, and time.     Coordination: Coordination normal.  Psychiatric:        Behavior: Behavior normal. Behavior is cooperative.        Thought Content: Thought content normal.        Judgment: Judgment normal.          Patient has been counseled extensively about nutrition and exercise as well as the importance of adherence with medications and regular follow-up. The patient was given clear instructions to go to ER or return to medical center if symptoms don't  improve, worsen or new problems develop. The patient verbalized understanding.   Follow-up: Return in about 3 months (around 09/30/2022) for HTN.   Gildardo Pounds, FNP-BC [facility] and [place], [place]   07/01/2022, 7:46 PM

## 2022-07-02 LAB — H. PYLORI BREATH TEST: H pylori Breath Test: NEGATIVE

## 2022-07-05 ENCOUNTER — Other Ambulatory Visit: Payer: Self-pay

## 2022-08-19 ENCOUNTER — Other Ambulatory Visit: Payer: Self-pay

## 2022-10-13 ENCOUNTER — Ambulatory Visit: Payer: Self-pay

## 2022-10-13 NOTE — Telephone Encounter (Signed)
Patient experiencing boil or ball near nose causing pain.   Left message to call back about symptoms.

## 2022-10-13 NOTE — Telephone Encounter (Signed)
Third attempt to reach pt. Routing to practice for PCPs review per protocol.

## 2022-10-13 NOTE — Telephone Encounter (Signed)
Second attempt to reach pt. Left message to call back. 

## 2022-10-14 NOTE — Telephone Encounter (Signed)
Unable to reach patient by phone x2.

## 2022-11-03 ENCOUNTER — Encounter (HOSPITAL_COMMUNITY): Payer: Self-pay

## 2022-11-03 ENCOUNTER — Ambulatory Visit (HOSPITAL_COMMUNITY)
Admission: EM | Admit: 2022-11-03 | Discharge: 2022-11-03 | Disposition: A | Discharge status: Home / Self Care | Payer: Self-pay | Attending: Emergency Medicine | Admitting: Emergency Medicine

## 2022-11-03 ENCOUNTER — Other Ambulatory Visit: Payer: Self-pay

## 2022-11-03 DIAGNOSIS — J029 Acute pharyngitis, unspecified: Secondary | ICD-10-CM | POA: Insufficient documentation

## 2022-11-03 LAB — POCT RAPID STREP A, ED / UC: Streptococcus, Group A Screen (Direct): NEGATIVE

## 2022-11-03 MED ORDER — IBUPROFEN 800 MG PO TABS
800.0000 mg | ORAL_TABLET | Freq: Once | ORAL | Status: AC
Start: 2022-11-03 — End: 2022-11-03
  Administered 2022-11-03: 800 mg via ORAL

## 2022-11-03 MED ORDER — IBUPROFEN 800 MG PO TABS
800.0000 mg | ORAL_TABLET | Freq: Three times a day (TID) | ORAL | 0 refills | Status: DC
  Filled 2022-11-03: qty 21, 7d supply, fill #0

## 2022-11-03 MED ORDER — IBUPROFEN 800 MG PO TABS
ORAL_TABLET | ORAL | Status: AC
  Filled 2022-11-03: qty 1

## 2022-11-03 MED ORDER — LIDOCAINE VISCOUS HCL 2 % MT SOLN
15.0000 mL | OROMUCOSAL | 0 refills | Status: DC | PRN
  Filled 2022-11-03: qty 100, 1d supply, fill #0

## 2022-11-03 NOTE — Discharge Instructions (Addendum)
Strep test was negative twice. You likely have a virus. This can cause swelling of the lymph nodes on your neck. This is treated with symptomatic care. I recommend ibuprofen every 6 hours for the next several days. You can alternate with tylenol as well. You can gargle with the lidocaine for topical pain relief.   La prueba de estreptococo fue [employer]. Probablemente tengas un virus. Esto puede causar inflamacin de los ganglios linfticos del cuello. Esto se trata con cuidados sintomticos. Recomiendo ibuprofeno cada 6 horas durante los [employer]. Tambin puedes alternar con tylenol. Puede hacer grgaras con lidocana para [employer] tpico.

## 2022-11-03 NOTE — ED Provider Notes (Signed)
[place]    CSN: [id number] Arrival date & time: 11/03/22  4034     History   Chief Complaint Chief Complaint  Patient presents with   Sore Throat    [name] is a 52 y.o. male.  Presents with sore throat that began yesterday Reports 10/10 pain with swallowing Swollen lymph nodes No fevers No cough  No known sick contacts Took tylenol once yesterday that didn't help  Past Medical History:  Diagnosis Date   Hyperlipidemia    Hypertension     Patient Active Problem List   Diagnosis Date Noted   Discoloration of eye 11/23/2016   Palpitations 11/23/2016   Dyslipidemia 11/23/2016   Wound infection after surgery 11/06/2015   Sebaceous cyst 10/19/2015   Health care maintenance 10/01/2015   Right inguinal hernia 10/27/2014   Hypertriglyceridemia 10/27/2014   Pain in joint, shoulder region 10/27/2014   Essential hypertension, benign 08/26/2014   Dental caries 08/26/2014   Radicular pain in right arm 10/16/2013   Right arm pain 07/19/2013   Other and unspecified hyperlipidemia 05/06/2013    Past Surgical History:  Procedure Laterality Date   HERNIA REPAIR      Home Medications    Prior to Admission medications   Medication Sig Start Date End Date Taking? Authorizing Provider  amLODipine (NORVASC) 10 MG tablet Take 1 tablet (10 mg total) by mouth daily. 07/01/22  Yes Gildardo Pounds, NP  ibuprofen (ADVIL) 800 MG tablet Take 1 tablet (800 mg total) by mouth 3 (three) times daily. 11/03/22  Yes Kamareon Sciandra, PA-C  lidocaine (XYLOCAINE) 2 % solution Use as directed 15 mLs in the mouth or throat every 3 (three) hours as needed for mouth pain. 11/03/22  Yes Olivianna Higley, Wells Guiles, PA-C  omega-3 acid ethyl esters (LOVAZA) 1 g capsule Take 2 capsules (2 g total) by mouth 2 (two) times daily. 07/01/22 12/28/22 Yes Gildardo Pounds, NP  rosuvastatin (CRESTOR) 20 MG tablet TAKE 1 TABLET (20 MG TOTAL) BY MOUTH DAILY. 07/01/22 12/28/22 Yes Gildardo Pounds, NP     Family History Family History  Problem Relation Age of Onset   Healthy Mother    Heart failure Father    Diabetes Sister     Social History Social History   Tobacco Use   Smoking status: Never   Smokeless tobacco: Never  Vaping Use   Vaping Use: Never used  Substance Use Topics   Alcohol use: Yes    Comment: occasionally   Drug use: No     Allergies   Shrimp flavor and Other   Review of Systems Review of Systems As per HPI  Physical Exam Triage Vital Signs ED Triage Vitals  Enc Vitals Group     BP 11/03/22 0916 (!) 133/104     Pulse Rate 11/03/22 0916 75     Resp 11/03/22 0916 16     Temp 11/03/22 0916 98.6 F (37 C)     Temp Source 11/03/22 0916 Oral     SpO2 11/03/22 0916 98 %     Weight 11/03/22 0916 175 lb (79.4 kg)     Height 11/03/22 0916 '5\' 5"'$  (1.651 m)     Head Circumference --      Peak Flow --      Pain Score 11/03/22 0915 10     Pain Loc --      Pain Edu? --      Excl. in [place]? --    No data found.  Updated Vital Signs BP (!) 133/104 (BP Location: Left Arm)   Pulse 75   Temp 98.6 F (37 C) (Oral)   Resp 16   Ht '5\' 5"'$  (1.651 m)   Wt 175 lb (79.4 kg)   SpO2 98%   BMI 29.12 kg/m   Physical Exam Vitals and nursing note reviewed.  Constitutional:      Appearance: Normal appearance. He is not ill-appearing.  HENT:     Mouth/Throat:     Mouth: Mucous membranes are moist.     Pharynx: Oropharynx is clear. Posterior oropharyngeal erythema present.     Tonsils: No tonsillar exudate or tonsillar abscesses. 3+ on the right. 3+ on the left.  Neck:     Comments: Prominent on R. Tender  Cardiovascular:     Rate and Rhythm: Normal rate and regular rhythm.     Pulses: Normal pulses.     Heart sounds: Normal heart sounds.  Pulmonary:     Effort: Pulmonary effort is normal.     Breath sounds: Normal breath sounds.  Lymphadenopathy:     Cervical: Cervical adenopathy present.  Skin:    General: Skin is warm and dry.     Findings: No  rash.  Neurological:     Mental Status: He is alert and oriented to person, place, and time.     UC Treatments / Results  Labs (all labs ordered are listed, but only abnormal results are displayed) Labs Reviewed  CULTURE, GROUP A STREP [facility])  POCT RAPID STREP A, ED / UC  POCT RAPID STREP A, ED / UC    EKG  Radiology No results found.  Procedures Procedures   Medications Ordered in UC Medications  ibuprofen (ADVIL) tablet 800 mg (800 mg Oral Given 11/03/22 0939)    Initial Impression / Assessment and Plan / UC Course  I have reviewed the triage vital signs and the nursing notes.  Pertinent labs & imaging results that were available during my care of the patient were reviewed by me and considered in my medical decision making (see chart for details).  Strep test negative With appearance of patient, swab recollected by this provider Second result also negative. Culture pending.  Ibuprofen dose given, pain down to 7/10 Discussed viral etiology Patient wife is concerned about the lymph nodes We discussed they are reactive, in this case to a virus.  Recommend to use tylenol and ibuprofen max doses every 4-6 hours over the next several days. Can gargle with lidocaine for topical relief. Discussed return if symptoms do not improve despite care at home. Return precautions discussed. Patient agrees to plan  Final Clinical Impressions(s) / UC Diagnoses   Final diagnoses:  Viral pharyngitis     Discharge Instructions      Strep test was negative twice. You likely have a virus. This can cause swelling of the lymph nodes on your neck. This is treated with symptomatic care. I recommend ibuprofen every 6 hours for the next several days. You can alternate with tylenol as well. You can gargle with the lidocaine for topical pain relief.   La prueba de estreptococo fue [employer]. Probablemente tengas un virus. Esto puede causar inflamacin de los ganglios linfticos  del cuello. Esto se trata con cuidados sintomticos. Recomiendo ibuprofeno cada 6 horas durante los [employer]. Tambin puedes alternar con tylenol. Puede hacer grgaras con lidocana para [employer] tpico.     ED Prescriptions     Medication Sig Dispense Auth. Provider  ibuprofen (ADVIL) 800 MG tablet Take 1 tablet (800 mg total) by mouth 3 (three) times daily. 21 tablet Royelle Hinchman, PA-C   lidocaine (XYLOCAINE) 2 % solution Use as directed 15 mLs in the mouth or throat every 3 (three) hours as needed for mouth pain. 100 mL Deondrea Aguado, Wells Guiles, PA-C      PDMP not reviewed this encounter.   Enya Bureau, Wells Guiles, [place] 11/03/22 1048

## 2022-11-03 NOTE — ED Triage Notes (Signed)
Chief Complaint: Patient having sore throat, lymph node swelling, no coughing, some SOB. No fever   Onset: 2 days    Prescriptions or OTC medications tried: No    Sick exposure: No  New foods, medications, or products: No  Recent Travel: No

## 2022-11-05 ENCOUNTER — Observation Stay (HOSPITAL_BASED_OUTPATIENT_CLINIC_OR_DEPARTMENT_OTHER)
Admission: EM | Admit: 2022-11-05 | Discharge: 2022-11-06 | Disposition: A | Discharge status: Home / Self Care | Payer: Self-pay | Attending: Family Medicine | Admitting: Family Medicine

## 2022-11-05 ENCOUNTER — Encounter (HOSPITAL_BASED_OUTPATIENT_CLINIC_OR_DEPARTMENT_OTHER): Payer: Self-pay | Admitting: Emergency Medicine

## 2022-11-05 ENCOUNTER — Encounter (HOSPITAL_COMMUNITY): Payer: Self-pay

## 2022-11-05 ENCOUNTER — Emergency Department (HOSPITAL_BASED_OUTPATIENT_CLINIC_OR_DEPARTMENT_OTHER): Payer: Self-pay

## 2022-11-05 ENCOUNTER — Other Ambulatory Visit: Payer: Self-pay

## 2022-11-05 DIAGNOSIS — E785 Hyperlipidemia, unspecified: Secondary | ICD-10-CM | POA: Diagnosis present

## 2022-11-05 DIAGNOSIS — J029 Acute pharyngitis, unspecified: Secondary | ICD-10-CM

## 2022-11-05 DIAGNOSIS — J0431 Supraglottitis, unspecified, with obstruction: Principal | ICD-10-CM | POA: Insufficient documentation

## 2022-11-05 DIAGNOSIS — J039 Acute tonsillitis, unspecified: Secondary | ICD-10-CM | POA: Insufficient documentation

## 2022-11-05 DIAGNOSIS — I10 Essential (primary) hypertension: Secondary | ICD-10-CM | POA: Diagnosis present

## 2022-11-05 DIAGNOSIS — J0430 Supraglottitis, unspecified, without obstruction: Secondary | ICD-10-CM | POA: Diagnosis present

## 2022-11-05 DIAGNOSIS — Z79899 Other long term (current) drug therapy: Secondary | ICD-10-CM | POA: Insufficient documentation

## 2022-11-05 DIAGNOSIS — Z20822 Contact with and (suspected) exposure to covid-19: Secondary | ICD-10-CM | POA: Insufficient documentation

## 2022-11-05 LAB — BASIC METABOLIC PANEL
Anion gap: 10 (ref 5–15)
BUN: 14 mg/dL (ref 6–20)
CO2: 26 mmol/L (ref 22–32)
Calcium: 9.3 mg/dL (ref 8.9–10.3)
Chloride: 103 mmol/L (ref 98–111)
Creatinine, Ser: 0.77 mg/dL (ref 0.61–1.24)
GFR, Estimated: >60 mL/min (ref >60)
Glucose, Bld: 119 mg/dL — ABNORMAL HIGH (ref 70–99)
Potassium: 4 mmol/L (ref 3.5–5.1)
Sodium: 139 mmol/L (ref 135–145)

## 2022-11-05 LAB — CBC WITH DIFFERENTIAL/PLATELET
Abs Immature Granulocytes: 0.02 10*3/uL (ref 0.00–0.07)
Basophils Absolute: 0.1 10*3/uL (ref 0.0–0.1)
Basophils Relative: 1 %
Eosinophils Absolute: 0.1 10*3/uL (ref 0.0–0.5)
Eosinophils Relative: 1 %
HCT: 49.5 % (ref 39.0–52.0)
Hemoglobin: 18.2 g/dL — ABNORMAL HIGH (ref 13.0–17.0)
Immature Granulocytes: 0 %
Lymphocytes Relative: 23 %
Lymphs Abs: 1.9 10*3/uL (ref 0.7–4.0)
MCH: 33.6 pg (ref 26.0–34.0)
MCHC: 36.8 g/dL — ABNORMAL HIGH (ref 30.0–36.0)
MCV: 91.5 fL (ref 80.0–100.0)
Monocytes Absolute: 0.5 10*3/uL (ref 0.1–1.0)
Monocytes Relative: 6 %
Neutro Abs: 5.6 10*3/uL (ref 1.7–7.7)
Neutrophils Relative %: 69 %
Platelets: 201 10*3/uL (ref 150–400)
RBC: 5.41 MIL/uL (ref 4.22–5.81)
RDW: 12.4 % (ref 11.5–15.5)
WBC: 8.2 10*3/uL (ref 4.0–10.5)
nRBC: 0 % (ref 0.0–0.2)

## 2022-11-05 LAB — RESP PANEL BY RT-PCR (RSV, FLU A&B, COVID)  RVPGX2
Influenza A by PCR: NEGATIVE
Influenza B by PCR: NEGATIVE
Resp Syncytial Virus by PCR: NEGATIVE
SARS Coronavirus 2 by RT PCR: NEGATIVE

## 2022-11-05 LAB — GROUP A STREP BY PCR: Group A Strep by PCR: NOT DETECTED

## 2022-11-05 LAB — CULTURE, GROUP A STREP (THRC)

## 2022-11-05 LAB — HIV ANTIBODY (ROUTINE TESTING W REFLEX): HIV Screen 4th Generation wRfx: NONREACTIVE

## 2022-11-05 MED ORDER — DEXAMETHASONE SODIUM PHOSPHATE 10 MG/ML IJ SOLN
10.0000 mg | Freq: Once | INTRAMUSCULAR | Status: AC
  Administered 2022-11-05: 10 mg via INTRAVENOUS
  Filled 2022-11-05: qty 1

## 2022-11-05 MED ORDER — DEXAMETHASONE SODIUM PHOSPHATE 10 MG/ML IJ SOLN
10.0000 mg | INTRAMUSCULAR | Status: DC
  Administered 2022-11-06: 10 mg via INTRAVENOUS
  Filled 2022-11-05: qty 1

## 2022-11-05 MED ORDER — ROSUVASTATIN CALCIUM 20 MG PO TABS
20.0000 mg | ORAL_TABLET | Freq: Every day | ORAL | Status: DC
  Administered 2022-11-06: 20 mg via ORAL
  Filled 2022-11-05: qty 1

## 2022-11-05 MED ORDER — POLYETHYLENE GLYCOL 3350 17 G PO PACK: 17.0000 g | PACK | Freq: Every day | ORAL | Status: DC | PRN

## 2022-11-05 MED ORDER — ENOXAPARIN SODIUM 40 MG/0.4ML IJ SOSY
40.0000 mg | PREFILLED_SYRINGE | INTRAMUSCULAR | Status: DC
  Administered 2022-11-05: 40 mg via SUBCUTANEOUS
  Filled 2022-11-05: qty 0.4

## 2022-11-05 MED ORDER — ACETAMINOPHEN 650 MG RE SUPP: 650.0000 mg | Freq: Four times a day (QID) | RECTAL | Status: DC | PRN

## 2022-11-05 MED ORDER — ACETAMINOPHEN 325 MG PO TABS
650.0000 mg | ORAL_TABLET | Freq: Four times a day (QID) | ORAL | Status: DC | PRN
  Administered 2022-11-05: 650 mg via ORAL
  Filled 2022-11-05: qty 2

## 2022-11-05 MED ORDER — SODIUM CHLORIDE 0.9 % IV SOLN
3.0000 g | Freq: Once | INTRAVENOUS | Status: AC
  Administered 2022-11-05: 3 g via INTRAVENOUS

## 2022-11-05 MED ORDER — FENTANYL CITRATE PF 50 MCG/ML IJ SOSY
50.0000 ug | PREFILLED_SYRINGE | Freq: Once | INTRAMUSCULAR | Status: AC
  Administered 2022-11-05: 50 ug via INTRAVENOUS
  Filled 2022-11-05: qty 1

## 2022-11-05 MED ORDER — AMLODIPINE BESYLATE 10 MG PO TABS: 10.0000 mg | ORAL_TABLET | Freq: Every day | ORAL | Status: DC

## 2022-11-05 MED ORDER — SODIUM CHLORIDE 0.9% FLUSH
3.0000 mL | Freq: Two times a day (BID) | INTRAVENOUS | Status: DC
  Administered 2022-11-05 – 2022-11-06 (×2): 3 mL via INTRAVENOUS

## 2022-11-05 MED ORDER — IOHEXOL 300 MG/ML  SOLN
100.0000 mL | Freq: Once | INTRAMUSCULAR | Status: AC | PRN
  Administered 2022-11-05: 100 mL via INTRAVENOUS

## 2022-11-05 MED ORDER — LACTATED RINGERS IV BOLUS
1000.0000 mL | Freq: Once | INTRAVENOUS | Status: AC
  Administered 2022-11-05: 1000 mL via INTRAVENOUS

## 2022-11-05 MED ORDER — AMLODIPINE BESYLATE 10 MG PO TABS
10.0000 mg | ORAL_TABLET | Freq: Every day | ORAL | Status: DC
  Administered 2022-11-05 – 2022-11-06 (×2): 10 mg via ORAL
  Filled 2022-11-05 (×2): qty 1

## 2022-11-05 MED ORDER — KETOROLAC TROMETHAMINE 30 MG/ML IJ SOLN: 30.0000 mg | Freq: Four times a day (QID) | INTRAMUSCULAR | Status: DC | PRN

## 2022-11-05 MED ORDER — SODIUM CHLORIDE 0.9 % IV SOLN
3.0000 g | Freq: Four times a day (QID) | INTRAVENOUS | Status: DC
  Administered 2022-11-05 – 2022-11-06 (×3): 3 g via INTRAVENOUS
  Filled 2022-11-05 (×3): qty 8

## 2022-11-05 NOTE — ED Notes (Signed)
Kim at [facility] will send transport.-ABB(NS)

## 2022-11-05 NOTE — Progress Notes (Signed)
Pharmacy Antibiotic Note  Kailyn Dubie is a 52 y.o. male admitted on 11/05/2022 with  sore throat .  Pharmacy has been consulted for unasyn dosing.  CT>>supraglottitis and significant mucosal edema  Scr <1  Plan: Unasyn 3g IV q6  Height: '5\' 5"'$  (165.1 cm) Weight: 79.4 kg (175 lb) IBW/kg (Calculated) : 61.5  Temp (24hrs), Avg:98.7 F (37.1 C), Min:98.5 F (36.9 C), Max:99 F (37.2 C)  Recent Labs  Lab 11/05/22 0921  WBC 8.2  CREATININE 0.77    Estimated Creatinine Clearance: 105 mL/min (by C-G formula based on SCr of 0.77 mg/dL).    Allergies  Allergen Reactions   Shrimp Flavor    Other Rash    Antimicrobials this admission: 1/6 unasyn>>  Dose adjustments this admission:   Microbiology results:  Onnie Boer, PharmD, BCIDP, AAHIVP, CPP Infectious Disease Pharmacist 11/05/2022 5:33 PM

## 2022-11-05 NOTE — ED Triage Notes (Signed)
  Patient comes in with sore throat that has been going on for 2 days.  Patient was seen on 1/4 and diagnosed with viral pharyngitis.  Patient states the pain and size of his lymph nodes have increased.  Endorses dysphagia and neck pain.  Pain 10/10, sharp.

## 2022-11-05 NOTE — ED Provider Notes (Signed)
[facility] EMERGENCY DEPT Provider Note   CSN: [id number] Arrival date & time: 11/05/22  0606     History  Chief Complaint  Patient presents with   Sore Throat    Shawn Porter is a 52 y.o. male.   Sore Throat     52 year old male presenting to the emergency department with a chief complaint of sore throat and difficulty swallowing.  Symptoms have been ongoing for the past 5 days.  He endorses significant right-sided throat pain and swelling.  He has difficulty swallowing and sometimes feels like he is having difficulty breathing.  Pain is 10 out of 10 in severity, sharp.  He is unsure if he has been having any fevers or chills.  He was seen at urgent care on 11/03/2022 and was diagnosed with viral pharyngitis.  He had been advised Tylenol and ibuprofen and gargle lidocaine for topical relief.  His strep test was negative twice.  Throat culture collected on 1/4 has not grown out anything but remains pending with reintubation for better growth.  Home Medications Prior to Admission medications   Medication Sig Start Date End Date Taking? Authorizing Provider  amLODipine (NORVASC) 10 MG tablet Take 1 tablet (10 mg total) by mouth daily. 07/01/22   Gildardo Pounds, NP  ibuprofen (ADVIL) 800 MG tablet Take 1 tablet (800 mg total) by mouth 3 (three) times daily. 11/03/22   Rising, Rebecca, PA-C  lidocaine (XYLOCAINE) 2 % solution Use as directed 15 mLs in the mouth or throat every 3 (three) hours as needed for mouth pain. 11/03/22   Rising, Wells Guiles, PA-C  omega-3 acid ethyl esters (LOVAZA) 1 g capsule Take 2 capsules (2 g total) by mouth 2 (two) times daily. 07/01/22 12/28/22  Gildardo Pounds, NP  rosuvastatin (CRESTOR) 20 MG tablet TAKE 1 TABLET (20 MG TOTAL) BY MOUTH DAILY. 07/01/22 12/28/22  Gildardo Pounds, NP      Allergies    Shrimp flavor and Other    Review of Systems   Review of Systems  All other systems reviewed and are negative.   Physical Exam Updated Vital  Signs BP (!) 140/100   Pulse 84   Temp 98.8 F (37.1 C) (Oral)   Resp 16   Ht '5\' 5"'$  (1.651 m)   Wt 79.4 kg   SpO2 98%   BMI 29.12 kg/m  Physical Exam Vitals and nursing note reviewed.  Constitutional:      General: He is not in acute distress. HENT:     Head: Normocephalic and atraumatic.     Mouth/Throat:     Pharynx: Pharyngeal swelling and posterior oropharyngeal erythema present.     Tonsils: Tonsillar abscess present. 2+ on the right.  Eyes:     Conjunctiva/sclera: Conjunctivae normal.     Pupils: Pupils are equal, round, and reactive to light.  Cardiovascular:     Rate and Rhythm: Normal rate and regular rhythm.  Pulmonary:     Effort: Pulmonary effort is normal. No respiratory distress.  Abdominal:     General: There is no distension.     Tenderness: There is no guarding.  Musculoskeletal:        General: No deformity or signs of injury.     Cervical back: Neck supple.  Skin:    Findings: No lesion or rash.  Neurological:     General: No focal deficit present.     Mental Status: He is alert. Mental status is at baseline.     ED Results /  Procedures / Treatments   Labs (all labs ordered are listed, but only abnormal results are displayed) Labs Reviewed  CBC WITH DIFFERENTIAL/PLATELET - Abnormal; Notable for the following components:      Result Value   Hemoglobin 18.2 (*)    MCHC 36.8 (*)    All other components within normal limits  BASIC METABOLIC PANEL - Abnormal; Notable for the following components:   Glucose, Bld 119 (*)    All other components within normal limits  RESP PANEL BY RT-PCR (RSV, FLU A&B, COVID)  [id number]  GROUP A STREP BY PCR    EKG None  Radiology CT Soft Tissue Neck W Contrast  Result Date: 11/05/2022 CLINICAL DATA:  Right-sided neck swelling and pain, epiglottitis or tonsillitis suspected. Sore throat for 2 days. EXAM: CT NECK WITH CONTRAST TECHNIQUE: Multidetector CT imaging of the neck was performed using the standard protocol  following the bolus administration of intravenous contrast. RADIATION DOSE REDUCTION: This exam was performed according to the departmental dose-optimization program which includes automated exposure control, adjustment of the mA and/or kV according to patient size and/or use of iterative reconstruction technique. CONTRAST:  122m OMNIPAQUE IOHEXOL 300 MG/ML  SOLN COMPARISON:  None Available. FINDINGS: Pharynx and larynx: Thickened and hyperenhancing tonsils especially on the right where there is an ovoid hypoenhancing area measuring 16 mm in depth but seemingly not covered at the mucosal surface. Regional pharyngeal/uvula edema extending into the supraglottic larynx with asymmetric thickening of the right area epiglottic fold, narrowing of the airway. The glottis is closed at time of imaging, limiting assessment of the glottis and accentuating airway narrowing. Retropharyngeal edema without retropharyngeal organized collection. Salivary glands: No inflammation, mass, or stone. Thyroid: Normal. Lymph nodes: Expected thickening of cervical lymph nodes bilaterally. Vascular: Negative. Limited intracranial: Negative. Visualized orbits: Negative Mastoids and visualized paranasal sinuses: Clear Skeleton: No acute finding. Upper chest: No acute finding IMPRESSION: Tonsillitis with pharyngitis and supraglottitis. Purulence at the right tonsillar fossa although no clear encapsulation. Notable low-density thickening posteriorly at the aryepiglottic folds, favor very edematous mucosa over abscess. Airway narrowing. Electronically Signed   By: JJorje GuildM.D.   On: 11/05/2022 10:48    Procedures Procedures    Medications Ordered in ED Medications  Ampicillin-Sulbactam (UNASYN) 3 g in sodium chloride 0.9 % 100 mL IVPB (0 g Intravenous Stopped 11/05/22 0922)  lactated ringers bolus 1,000 mL (0 mLs Intravenous Stopped 11/05/22 0922)  fentaNYL (SUBLIMAZE) injection 50 mcg (50 mcg Intravenous Given 11/05/22 0819)  iohexol  (OMNIPAQUE) 300 MG/ML solution 100 mL (100 mLs Intravenous Contrast Given 11/05/22 1037)  dexamethasone (DECADRON) injection 10 mg (10 mg Intravenous Given 11/05/22 1103)    ED Course/ Medical Decision Making/ A&P                           Medical Decision Making Amount and/or Complexity of Data Reviewed Labs: ordered. Radiology: ordered.  Risk Prescription drug management. Decision regarding hospitalization.    52year old male presenting to the emergency department with a chief complaint of sore throat and difficulty swallowing.  Symptoms have been ongoing for the past 5 days.  He endorses significant right-sided throat pain and swelling.  He has difficulty swallowing and sometimes feels like he is having difficulty breathing.  Pain is 10 out of 10 in severity, sharp.  He is unsure if he has been having any fevers or chills.  He was seen at urgent care on 11/03/2022 and was diagnosed with viral  pharyngitis.  He had been advised Tylenol and ibuprofen and gargle lidocaine for topical relief.  His strep test was negative twice.  Throat culture collected on 1/4 has not grown out anything but remains pending with reintubation for better growth.  On arrival, the patient was afebrile, vitally stable, hypertensive BP 181/112, saturating well on room air, not tachypneic.  Physical exam significant for pharyngeal erythema with some swelling more right-sided noted concerning for possible PTA.  Patient has good range of motion of the neck, low concern for RPA.  No evidence for Ludwig's on exam.  Patient has no stridor on exam.  Laboratory evaluation significant for CBC without a leukocytosis, evidence of hemoconcentration with a hemoglobin of 18.2, BMP generally unremarkable, COVID-19, influenza, RSV PCR testing negative and group A strep PCR testing negative.  The patient was administered IV Decadron, IV Unasyn, IV fluid bolus 1 L, IV fentanyl 50 mcg.  CT Neck W: IMPRESSION:  Tonsillitis with pharyngitis  and supraglottitis. Purulence at the  right tonsillar fossa although no clear encapsulation. Notable  low-density thickening posteriorly at the aryepiglottic folds, favor  very edematous mucosa over abscess. Airway narrowing.    The patient feels symptomatically improved following these interventions however, I spoke with Dr. Janace Hoard regarding the patient's symptoms and CT read. He recommended admission for observation to continue to monitor the patient for clinical improvement after antibiotics and steroids.  Discussed the plan of care with the patient and family bedside.  Hospitalist medicine consulted for admission. Dr. Trilby Drummer accepted the patient in admission.   Final Clinical Impression(s) / ED Diagnoses Final diagnoses:  Tonsillitis  Pharyngitis, unspecified etiology  Supraglottitis with airway obstruction    Rx / DC Orders ED Discharge Orders     None         Regan Lemming, MD 11/05/22 1229

## 2022-11-05 NOTE — Consult Note (Signed)
Reason for Consult:throat swelling Referring Physician: Dr Arta Silence Shawn Porter is an 52 y.o. male.  HPI: hx of 5 days of sore htroat and decreased eating. He had some intermittant feeling like breathing problem but never any stridor. He was seen in ER today and CT scan showed supraglottic swelling and tonsillitis. He now feels substantially better. He feels like he can eat now and no breathing issues. His pain is dramatically better also.   Past Medical History:  Diagnosis Date   Hyperlipidemia    Hypertension     Past Surgical History:  Procedure Laterality Date   HERNIA REPAIR      Family History  Problem Relation Age of Onset   Healthy Mother    Heart failure Father    Diabetes Sister     Social History:  reports that he has never smoked. He has never used smokeless tobacco. He reports current alcohol use. He reports that he does not use drugs.  Allergies:  Allergies  Allergen Reactions   Shrimp Flavor    Other Rash    Medications: I have reviewed the patient's current medications.  Results for orders placed or performed during the hospital encounter of 11/05/22 (from the past 48 hour(s))  Resp panel by RT-PCR (RSV, Flu A&B, Covid) Anterior Nasal Swab     Status: None   Collection Time: 11/05/22  6:20 AM   Specimen: Anterior Nasal Swab  Result Value Ref Range   SARS Coronavirus 2 by RT PCR NEGATIVE NEGATIVE    Comment: (NOTE) SARS-CoV-2 target nucleic acids are NOT DETECTED.  The SARS-CoV-2 RNA is generally detectable in upper respiratory specimens during the acute phase of infection. The lowest concentration of SARS-CoV-2 viral copies this assay can detect is 138 copies/mL. A negative result does not preclude SARS-Cov-2 infection and should not be used as the sole basis for treatment or other patient management decisions. A negative result may occur with  improper specimen collection/handling, submission of specimen other than nasopharyngeal swab,  presence of viral mutation(s) within the areas targeted by this assay, and inadequate number of viral copies(<138 copies/mL). A negative result must be combined with clinical observations, patient history, and epidemiological information. The expected result is Negative.  Fact Sheet for Patients:  EntrepreneurPulse.com.au  Fact Sheet for Healthcare Providers:  IncredibleEmployment.be  This test is no t yet approved or cleared by the [place] FDA and  has been authorized for detection and/or diagnosis of SARS-CoV-2 by FDA under an Emergency Use Authorization (EUA). This EUA will remain  in effect (meaning this test can be used) for the duration of the COVID-19 declaration under Section 564(b)(1) of the Act, 21 U.S.C.section 360bbb-3(b)(1), unless the authorization is terminated  or revoked sooner.       Influenza A by PCR NEGATIVE NEGATIVE   Influenza B by PCR NEGATIVE NEGATIVE    Comment: (NOTE) The Xpert Xpress SARS-CoV-2/FLU/RSV plus assay is intended as an aid in the diagnosis of influenza from Nasopharyngeal swab specimens and should not be used as a sole basis for treatment. Nasal washings and aspirates are unacceptable for Xpert Xpress SARS-CoV-2/FLU/RSV testing.  Fact Sheet for Patients: EntrepreneurPulse.com.au  Fact Sheet for Healthcare Providers: IncredibleEmployment.be  This test is not yet approved or cleared by the [place] FDA and has been authorized for detection and/or diagnosis of SARS-CoV-2 by FDA under an Emergency Use Authorization (EUA). This EUA will remain in effect (meaning this test can be used) for the duration of the COVID-19 declaration under  Section 564(b)(1) of the Act, 21 U.S.C. section 360bbb-3(b)(1), unless the authorization is terminated or revoked.     Resp Syncytial Virus by PCR NEGATIVE NEGATIVE    Comment: (NOTE) Fact Sheet for  Patients: EntrepreneurPulse.com.au  Fact Sheet for Healthcare Providers: IncredibleEmployment.be  This test is not yet approved or cleared by the [place] FDA and has been authorized for detection and/or diagnosis of SARS-CoV-2 by FDA under an Emergency Use Authorization (EUA). This EUA will remain in effect (meaning this test can be used) for the duration of the COVID-19 declaration under Section 564(b)(1) of the Act, 21 U.S.C. section 360bbb-3(b)(1), unless the authorization is terminated or revoked.  Performed at [facility], [address] [place], [place] [address]   Group A Strep by PCR     Status: None   Collection Time: 11/05/22  6:20 AM   Specimen: Anterior Nasal Swab; Sterile Swab  Result Value Ref Range   Group A Strep by PCR NOT DETECTED NOT DETECTED    Comment: Performed at [facility], [address], [place], [place] [address]  CBC with Differential     Status: Abnormal   Collection Time: 11/05/22  9:21 AM  Result Value Ref Range   WBC 8.2 4.0 - 10.5 K/uL   RBC 5.41 4.22 - 5.81 MIL/uL   Hemoglobin 18.2 (H) 13.0 - 17.0 g/dL   HCT 49.5 39.0 - 52.0 %   MCV 91.5 80.0 - 100.0 fL   MCH 33.6 26.0 - 34.0 pg   MCHC 36.8 (H) 30.0 - 36.0 g/dL   RDW 12.4 11.5 - 15.5 %   Platelets 201 150 - 400 K/uL   nRBC 0.0 0.0 - 0.2 %   Neutrophils Relative % 69 %   Neutro Abs 5.6 1.7 - 7.7 K/uL   Lymphocytes Relative 23 %   Lymphs Abs 1.9 0.7 - 4.0 K/uL   Monocytes Relative 6 %   Monocytes Absolute 0.5 0.1 - 1.0 K/uL   Eosinophils Relative 1 %   Eosinophils Absolute 0.1 0.0 - 0.5 K/uL   Basophils Relative 1 %   Basophils Absolute 0.1 0.0 - 0.1 K/uL   Immature Granulocytes 0 %   Abs Immature Granulocytes 0.02 0.00 - 0.07 K/uL    Comment: Performed at [facility], [address] [place], [place] [address]  Basic metabolic panel     Status: Abnormal   Collection Time: 11/05/22   9:21 AM  Result Value Ref Range   Sodium 139 135 - 145 mmol/L   Potassium 4.0 3.5 - 5.1 mmol/L   Chloride 103 98 - 111 mmol/L   CO2 26 22 - 32 mmol/L   Glucose, Bld 119 (H) 70 - 99 mg/dL    Comment: Glucose reference range applies only to samples taken after fasting for at least 8 hours.   BUN 14 6 - 20 mg/dL   Creatinine, Ser 0.77 0.61 - 1.24 mg/dL   Calcium 9.3 8.9 - 10.3 mg/dL   GFR, Estimated >60 >60 mL/min    Comment: (NOTE) Calculated using the CKD-EPI Creatinine Equation (2021)    Anion gap 10 5 - 15    Comment: Performed at [facility], [address], [place],  [address]    CT Soft Tissue Neck W Contrast  Result Date: 11/05/2022 CLINICAL DATA:  Right-sided neck swelling and pain, epiglottitis or tonsillitis suspected. Sore throat for 2 days. EXAM: CT NECK WITH CONTRAST TECHNIQUE: Multidetector CT imaging of the neck was performed using the  standard protocol following the bolus administration of intravenous contrast. RADIATION DOSE REDUCTION: This exam was performed according to the departmental dose-optimization program which includes automated exposure control, adjustment of the mA and/or kV according to patient size and/or use of iterative reconstruction technique. CONTRAST:  137m OMNIPAQUE IOHEXOL 300 MG/ML  SOLN COMPARISON:  None Available. FINDINGS: Pharynx and larynx: Thickened and hyperenhancing tonsils especially on the right where there is an ovoid hypoenhancing area measuring 16 mm in depth but seemingly not covered at the mucosal surface. Regional pharyngeal/uvula edema extending into the supraglottic larynx with asymmetric thickening of the right area epiglottic fold, narrowing of the airway. The glottis is closed at time of imaging, limiting assessment of the glottis and accentuating airway narrowing. Retropharyngeal edema without retropharyngeal organized collection. Salivary glands: No inflammation, mass, or stone. Thyroid: Normal. Lymph  nodes: Expected thickening of cervical lymph nodes bilaterally. Vascular: Negative. Limited intracranial: Negative. Visualized orbits: Negative Mastoids and visualized paranasal sinuses: Clear Skeleton: No acute finding. Upper chest: No acute finding IMPRESSION: Tonsillitis with pharyngitis and supraglottitis. Purulence at the right tonsillar fossa although no clear encapsulation. Notable low-density thickening posteriorly at the aryepiglottic folds, favor very edematous mucosa over abscess. Airway narrowing. Electronically Signed   By: JJorje GuildM.D.   On: 11/05/2022 10:48    ROS Blood pressure (!) 151/99, pulse 91, temperature 98.5 F (36.9 C), temperature source Oral, resp. rate 16, height '5\' 5"'$  (1.651 m), weight 79.4 kg, SpO2 97 %. Physical Exam Constitutional:      Appearance: He is well-developed.  HENT:     Head: Normocephalic.     Mouth/Throat:     Mouth: Mucous membranes are moist.     Tonsils: Tonsillar exudate present. 2+ on the right. 1+ on the left.     Comments: The right tonsil is slightly more swollen than the left. The uvula looks about normall there is no posterior wall, tongue or FOM swelling. He opens mouth normally. No stridor or distress. He looks exceptionally comfortable and has normal voice.  Neurological:     Mental Status: He is alert.       Assessment/Plan: Tonsilltis/supraglottis- he appears to have resolved significant amount of the edema since his uvula looks very swollen on CT scan but now normal. He has no airway issues, is comfortably sitting up with no distress and has normal voice. I do not think a FOE is necessary at this point. Will observe over night and if he continues doing so well discharge on po antibiotics and taper steroids. He could have a regular diet if he wants.   JMelissa Montane1/04/2023, 6:42 PM

## 2022-11-05 NOTE — Plan of Care (Signed)

## 2022-11-05 NOTE — H&P (Signed)
History and Physical   Tejas Seawood ERD:[id number] DOB: 10-Nov-1970 DOA: 11/05/2022  PCP: Gildardo Pounds, NP   Patient coming from: Home  Chief Complaint: Sore throat  HPI: Shawn Porter is a 52 y.o. male with medical history significant of hyperlipidemia and hypertension presenting with worsening sore throat.  Patient has had ongoing sore throat for the past 5 days or so.  He was seen on the fourth of the month diagnosed with viral pharyngitis.  Since this is pain and degree of lymphadenopathy has been steadily increasing.  He is now been experiencing dysphagia described as a 10 out of 10 mostly right-sided neck pain.  Also reporting intermittent sensation of difficulty breathing.  He denies fevers, chills, chest pain, abdominal pain, constipation, diarrhea, nausea, vomiting.  ED Course: Vital signs in the ED significant for blood pressure in the 563J to 497W systolic.  Lab workup included BMP with glucose 119.  CBC with hemoglobin 18.2.  Rester panel flu COVID RSV negative.  Group A strep negative.  Throat culture obtained on January 4 negative as well thus far.  CT of the soft tissue of the neck showed tonsillitis with pharyngitis and supraglottitis with purulence of the right tonsillar fossa without clear Relation.  Evidence of significant mucosal edema without evidence of abscess and some airway narrowing noted as well.  Patient received Unasyn, fentanyl, Decadron, liter fluids in the ED.  With some improvement.  ENT consulted recommend observation with antibiotics and steroids and will see the patient in consultation per EDP.  Review of Systems: As per HPI otherwise all other systems reviewed and are negative.  Past Medical History:  Diagnosis Date   Hyperlipidemia    Hypertension     Past Surgical History:  Procedure Laterality Date   HERNIA REPAIR      Social History  reports that he has never smoked. He has never used smokeless tobacco. He reports current alcohol  use. He reports that he does not use drugs.  Allergies  Allergen Reactions   Shrimp Flavor    Other Rash    Family History  Problem Relation Age of Onset   Healthy Mother    Heart failure Father    Diabetes Sister   Reviewed on admission  Prior to Admission medications   Medication Sig Start Date End Date Taking? Authorizing Provider  amLODipine (NORVASC) 10 MG tablet Take 1 tablet (10 mg total) by mouth daily. 07/01/22   Gildardo Pounds, NP  ibuprofen (ADVIL) 800 MG tablet Take 1 tablet (800 mg total) by mouth 3 (three) times daily. 11/03/22   Rising, Rebecca, PA-C  lidocaine (XYLOCAINE) 2 % solution Use as directed 15 mLs in the mouth or throat every 3 (three) hours as needed for mouth pain. 11/03/22   Rising, Wells Guiles, PA-C  omega-3 acid ethyl esters (LOVAZA) 1 g capsule Take 2 capsules (2 g total) by mouth 2 (two) times daily. 07/01/22 12/28/22  Gildardo Pounds, NP  rosuvastatin (CRESTOR) 20 MG tablet TAKE 1 TABLET (20 MG TOTAL) BY MOUTH DAILY. 07/01/22 12/28/22  Gildardo Pounds, NP    Physical Exam: Vitals:   11/05/22 1111 11/05/22 1252 11/05/22 1559 11/05/22 1634  BP:   (!) 147/91 (!) 151/99  Pulse:   88 91  Resp: '16  16 16  '$ Temp:  99 F (37.2 C) 98.5 F (36.9 C) 98.5 F (36.9 C)  TempSrc:  Oral Oral Oral  SpO2:   98% 97%  Weight:      Height:  Physical Exam Constitutional:      General: He is not in acute distress.    Appearance: Normal appearance.  HENT:     Head: Normocephalic and atraumatic.     Mouth/Throat:     Mouth: Mucous membranes are moist.     Pharynx: Pharyngeal swelling present.  Eyes:     Extraocular Movements: Extraocular movements intact.     Pupils: Pupils are equal, round, and reactive to light.  Neck:     Comments: swelling Cardiovascular:     Rate and Rhythm: Normal rate and regular rhythm.     Pulses: Normal pulses.     Heart sounds: Normal heart sounds.  Pulmonary:     Effort: Pulmonary effort is normal. No respiratory distress.      Breath sounds: Normal breath sounds.  Abdominal:     General: Bowel sounds are normal. There is no distension.     Palpations: Abdomen is soft.     Tenderness: There is no abdominal tenderness.  Musculoskeletal:        General: No swelling or deformity.  Lymphadenopathy:     Cervical: Cervical adenopathy present.  Skin:    General: Skin is warm and dry.  Neurological:     General: No focal deficit present.     Mental Status: Mental status is at baseline.    Labs on Admission: I have personally reviewed following labs and imaging studies  CBC: Recent Labs  Lab 11/05/22 0921  WBC 8.2  NEUTROABS 5.6  HGB 18.2*  HCT 49.5  MCV 91.5  PLT 704    Basic Metabolic Panel: Recent Labs  Lab 11/05/22 0921  NA 139  K 4.0  CL 103  CO2 26  GLUCOSE 119*  BUN 14  CREATININE 0.77  CALCIUM 9.3    GFR: Estimated Creatinine Clearance: 105 mL/min (by C-G formula based on SCr of 0.77 mg/dL).  Liver Function Tests: No results for input(s): "AST", "ALT", "ALKPHOS", "BILITOT", "PROT", "ALBUMIN" in the last 168 hours.  Urine analysis:    Component Value Date/Time   COLORURINE YELLOW 04/21/2022 1229   APPEARANCEUR CLEAR 04/21/2022 1229   LABSPEC 1.008 04/21/2022 1229   PHURINE 6.0 04/21/2022 1229   GLUCOSEU NEGATIVE 04/21/2022 1229   HGBUR SMALL (A) 04/21/2022 1229   BILIRUBINUR NEGATIVE 04/21/2022 1229   KETONESUR NEGATIVE 04/21/2022 1229   PROTEINUR NEGATIVE 04/21/2022 1229   UROBILINOGEN 0.2 08/26/2014 1122   NITRITE NEGATIVE 04/21/2022 1229   LEUKOCYTESUR NEGATIVE 04/21/2022 1229    Radiological Exams on Admission: CT Soft Tissue Neck W Contrast  Result Date: 11/05/2022 CLINICAL DATA:  Right-sided neck swelling and pain, epiglottitis or tonsillitis suspected. Sore throat for 2 days. EXAM: CT NECK WITH CONTRAST TECHNIQUE: Multidetector CT imaging of the neck was performed using the standard protocol following the bolus administration of intravenous contrast. RADIATION DOSE  REDUCTION: This exam was performed according to the departmental dose-optimization program which includes automated exposure control, adjustment of the mA and/or kV according to patient size and/or use of iterative reconstruction technique. CONTRAST:  169m OMNIPAQUE IOHEXOL 300 MG/ML  SOLN COMPARISON:  None Available. FINDINGS: Pharynx and larynx: Thickened and hyperenhancing tonsils especially on the right where there is an ovoid hypoenhancing area measuring 16 mm in depth but seemingly not covered at the mucosal surface. Regional pharyngeal/uvula edema extending into the supraglottic larynx with asymmetric thickening of the right area epiglottic fold, narrowing of the airway. The glottis is closed at time of imaging, limiting assessment of the glottis and accentuating airway  narrowing. Retropharyngeal edema without retropharyngeal organized collection. Salivary glands: No inflammation, mass, or stone. Thyroid: Normal. Lymph nodes: Expected thickening of cervical lymph nodes bilaterally. Vascular: Negative. Limited intracranial: Negative. Visualized orbits: Negative Mastoids and visualized paranasal sinuses: Clear Skeleton: No acute finding. Upper chest: No acute finding IMPRESSION: Tonsillitis with pharyngitis and supraglottitis. Purulence at the right tonsillar fossa although no clear encapsulation. Notable low-density thickening posteriorly at the aryepiglottic folds, favor very edematous mucosa over abscess. Airway narrowing. Electronically Signed   By: Jorje Guild M.D.   On: 11/05/2022 10:48    EKG: Not performed in the emergency department  Assessment/Plan Principal Problem:   Supraglottitis Active Problems:   Essential hypertension, benign   Dyslipidemia    Supraglottitis Pharyngitis Tonsillitis > Patient with worsening pharyngitis now with CT imaging showing supraglottitis and significant mucosal edema and purulence without evidence of true abscess, but with airway narrowing. >  Reporting difficulty swallowing, worsening pain, intermittent sensation of difficulty breathing. > Started on Unasyn and Decadron in the ED.  ENT consulted and agreed with interventions and observation and per EDP will see the patient in consultation. - Monitor on telemetry - Appreciate ENT recommendations - Continue with Unasyn - Continue with daily Decadron - Trend fever curve and WBC  Hypertension - Continue home amlodipine  Hyperlipidemia - Continue home rosuvastatin  DVT prophylaxis: Lovenox Code Status:   Full, confirmed with patient Family Communication:  Updated at bedside  Disposition Plan:   Patient is from:  Home  Anticipated DC to:  Home  Anticipated DC date:  To 2 days  Anticipated DC barriers: None  Consults called:  ENT Admission status:  Observation, telemetry  Severity of Illness: The appropriate patient status for this patient is OBSERVATION. Observation status is judged to be reasonable and necessary in order to provide the required intensity of service to ensure the patient's safety. The patient's presenting symptoms, physical exam findings, and initial radiographic and laboratory data in the context of their medical condition is felt to place them at decreased risk for further clinical deterioration. Furthermore, it is anticipated that the patient will be medically stable for discharge from the hospital within 2 midnights of admission.    Marcelyn Bruins MD [facility]  How to contact the [facility] Attending or Consulting provider Reader or covering provider during after hours [place], for this patient?   Check the care team in [facility] and look for a) attending/consulting [facility] provider listed and b) the [facility] team listed Log into [email or web address] and use [place]'s universal password to access. If you do not have the password, please contact the hospital operator. Locate the [facility] provider you are looking for under [facility] and page to a number that you  can be directly reached. If you still have difficulty reaching the provider, please page the [facility] (Director on Call) for the Hospitalists listed on amion for assistance.  11/05/2022, 5:14 PM

## 2022-11-06 LAB — BASIC METABOLIC PANEL
Anion gap: 9 (ref 5–15)
BUN: 15 mg/dL (ref 6–20)
CO2: 23 mmol/L (ref 22–32)
Calcium: 9.1 mg/dL (ref 8.9–10.3)
Chloride: 104 mmol/L (ref 98–111)
Creatinine, Ser: 0.71 mg/dL (ref 0.61–1.24)
GFR, Estimated: >60 mL/min (ref >60)
Glucose, Bld: 129 mg/dL — ABNORMAL HIGH (ref 70–99)
Potassium: 3.9 mmol/L (ref 3.5–5.1)
Sodium: 136 mmol/L (ref 135–145)

## 2022-11-06 LAB — CBC
HCT: 48.3 % (ref 39.0–52.0)
Hemoglobin: 17.5 g/dL — ABNORMAL HIGH (ref 13.0–17.0)
MCH: 33.3 pg (ref 26.0–34.0)
MCHC: 36.2 g/dL — ABNORMAL HIGH (ref 30.0–36.0)
MCV: 91.8 fL (ref 80.0–100.0)
Platelets: 218 10*3/uL (ref 150–400)
RBC: 5.26 MIL/uL (ref 4.22–5.81)
RDW: 12.1 % (ref 11.5–15.5)
WBC: 10.1 10*3/uL (ref 4.0–10.5)
nRBC: 0 % (ref 0.0–0.2)

## 2022-11-06 MED ORDER — AMOXICILLIN-POT CLAVULANATE 875-125 MG PO TABS: 1.0000 | ORAL_TABLET | Freq: Two times a day (BID) | ORAL | 0 refills | Status: AC

## 2022-11-06 MED ORDER — PREDNISONE 10 MG PO TABS: ORAL_TABLET | ORAL | 0 refills | Status: DC

## 2022-11-06 NOTE — Progress Notes (Signed)
   Subjective:    Patient ID: Shawn Porter, male    DOB: 07-16-1971, 52 y.o.   MRN: [id number]  Sore Throat    He has no pain and feels back to normal. He has normal voice. He has no swallowing problem.    Review of Systems     Objective:   Physical Exam   Normal voice and looks comfortable and sitting up . No pain. Swelling of throat looks essentially resolved. Neck is soft and no tenderness.      Assessment & Plan:   He has resolved the primary issues for admission. He has no pain, no swallowing issue and normal voice. He has no breathing issues. He can go home on antibiotics and a steroid taper. Follow up [phone] if not totally resolved by 48 hours meaning any worsening of his current status which is already essentially back to normal.

## 2022-11-06 NOTE — Discharge Summary (Signed)
Physician Discharge Summary  Shawn Porter XBL:[id number] DOB: 14-Jun-1971 DOA: 11/05/2022  PCP: Gildardo Pounds, NP  Admit date: 11/05/2022 Discharge date: 11/06/2022    Admitted From: Home Disposition: Home  Recommendations for Outpatient Follow-up:  Follow up with PCP in 1-2 weeks Please obtain BMP/CBC in one week Please follow up with your PCP on the following pending results: Unresulted Labs (From admission, onward)     Start     Ordered   11/12/22 0500  Creatinine, serum  (enoxaparin (LOVENOX)    CrCl >/= 30 ml/min)  Weekly,   R     Comments: while on enoxaparin therapy   Question:  Specimen collection method  Answer:  Lab=Lab collect   11/05/22 [place]: None Equipment/Devices: None  Discharge Condition: Stable CODE STATUS: Full code Diet recommendation: Regular/cardiac  Following HPI and ED course is copied from my hospitalist colleague Dr. Tally Joe H&P. HPI: Shawn Porter is a 52 y.o. male with medical history significant of hyperlipidemia and hypertension presenting with worsening sore throat.   Patient has had ongoing sore throat for the past 5 days or so.  He was seen on the fourth of the month diagnosed with viral pharyngitis.  Since this is pain and degree of lymphadenopathy has been steadily increasing.  He is now been experiencing dysphagia described as a 10 out of 10 mostly right-sided neck pain.  Also reporting intermittent sensation of difficulty breathing.   He denies fevers, chills, chest pain, abdominal pain, constipation, diarrhea, nausea, vomiting.   ED Course: Vital signs in the ED significant for blood pressure in the 300T to 622Q systolic.  Lab workup included BMP with glucose 119.  CBC with hemoglobin 18.2.  Rester panel flu COVID RSV negative.  Group A strep negative.  Throat culture obtained on January 4 negative as well thus far.  CT of the soft tissue of the neck showed tonsillitis with pharyngitis and supraglottitis  with purulence of the right tonsillar fossa without clear Relation.  Evidence of significant mucosal edema without evidence of abscess and some airway narrowing noted as well.  Patient received Unasyn, fentanyl, Decadron, liter fluids in the ED.  With some improvement.  ENT consulted recommend observation with antibiotics and steroids and will see the patient in consultation per EDP.  Subjective: Patient seen and examined.  His wife is at the bedside.  He states that he is feeling a lot better.  He is able to speak in full sentences and his speech is fluent and he is not having any trouble with swallowing regular diet as well.  He feels comfortable going home.  Brief/Interim Summary: Patient was admitted to hospital service due to supraglottitis, pharyngitis and tonsillitis, he was started on Unasyn as well as dexamethasone.  He was evaluated by ENT and since his symptoms appeared to have resolved significantly by the time he was seen and he was able to eat the food, ENT did not feel the need of FOE and recommended observation overnight with IV antibiotics and steroids and to advance diet in the morning which we did and he is tolerating that.  ENT cleared him for discharge.  I am discharging him on tapering dose of prednisone over the course of next 12 days as well as 10 more days of oral Augmentin.  Discharge plan was discussed with patient and/or family member and they verbalized understanding and agreed with it.  Discharge Diagnoses:  Principal Problem:   Supraglottitis Active Problems:   Essential hypertension, benign   Dyslipidemia    Discharge Instructions   Allergies as of 11/06/2022       Reactions   Other Rash        Medication List     STOP taking these medications    ibuprofen 800 MG tablet Commonly known as: ADVIL       TAKE these medications    amLODipine 10 MG tablet Commonly known as: NORVASC Tome 1 tableta (10 mg en total) por va oral diariamente. (Take 1  tablet (10 mg total) by mouth daily.)   amoxicillin-clavulanate 875-125 MG tablet Commonly known as: AUGMENTIN Take 1 tablet by mouth 2 (two) times daily for 10 days.   aspirin EC 81 MG tablet Take 81 mg by mouth 3 (three) times a week. Swallow whole.   lidocaine 2 % solution Commonly known as: XYLOCAINE Use as directed 15 mLs in the mouth or throat every 3 (three) hours as needed for mouth pain.   omega-3 acid ethyl esters 1 g capsule Commonly known as: LOVAZA Take 2 capsules (2 g total) by mouth 2 (two) times daily.   predniSONE 10 MG tablet Commonly known as: DELTASONE Take 4 tablets p.o. daily for 3 days, followed by 3 tablets p.o. daily for 3 days, followed by 2 tablets p.o. daily for 3 days followed by 1 tablet p.o. daily for 3 days.   rosuvastatin 20 MG tablet Commonly known as: CRESTOR Tome 1 tableta (20 mg en total) por va oral diariamente. (TAKE 1 TABLET (20 MG TOTAL) BY MOUTH DAILY.) What changed: how much to take        Follow-up Information     Gildardo Pounds, NP. Call .   Specialty: Nurse Practitioner Contact information: [place] [address] [phone]         [facility]. Go to .   Why: As needed, If symptoms worsen Contact information: [address]  [place] [address] [phone]        Gildardo Pounds, NP Follow up in 1 week(s).   Specialty: Nurse Practitioner Contact information: [address] [place] [address] [phone]                Allergies  Allergen Reactions   Other Rash    Consultations: ENT   Procedures/Studies: CT Soft Tissue Neck W Contrast  Result Date: 11/05/2022 CLINICAL DATA:  Right-sided neck swelling and pain, epiglottitis or tonsillitis suspected. Sore throat for 2 days. EXAM: CT NECK WITH CONTRAST TECHNIQUE: Multidetector CT imaging of the neck was performed using the standard protocol following the  bolus administration of intravenous contrast. RADIATION DOSE REDUCTION: This exam was performed according to the departmental dose-optimization program which includes automated exposure control, adjustment of the mA and/or kV according to patient size and/or use of iterative reconstruction technique. CONTRAST:  136m OMNIPAQUE IOHEXOL 300 MG/ML  SOLN COMPARISON:  None Available. FINDINGS: Pharynx and larynx: Thickened and hyperenhancing tonsils especially on the right where there is an ovoid hypoenhancing area measuring 16 mm in depth but seemingly not covered at the mucosal surface. Regional pharyngeal/uvula edema extending into the supraglottic larynx with asymmetric thickening of the right area epiglottic fold, narrowing of the airway. The glottis is closed at time of imaging, limiting assessment of the glottis and accentuating airway narrowing. Retropharyngeal edema without retropharyngeal organized collection. Salivary glands: No inflammation, mass, or stone. Thyroid:  Normal. Lymph nodes: Expected thickening of cervical lymph nodes bilaterally. Vascular: Negative. Limited intracranial: Negative. Visualized orbits: Negative Mastoids and visualized paranasal sinuses: Clear Skeleton: No acute finding. Upper chest: No acute finding IMPRESSION: Tonsillitis with pharyngitis and supraglottitis. Purulence at the right tonsillar fossa although no clear encapsulation. Notable low-density thickening posteriorly at the aryepiglottic folds, favor very edematous mucosa over abscess. Airway narrowing. Electronically Signed   By: Jorje Guild M.D.   On: 11/05/2022 10:48     Discharge Exam: Vitals:   11/06/22 0532 11/06/22 0824  BP: 127/85 125/83  Pulse: 60 66  Resp: 17 16  Temp: 97.6 F (36.4 C) 97.9 F (36.6 C)  SpO2: 99% 98%   Vitals:   11/05/22 1634 11/05/22 2246 11/06/22 0532 11/06/22 0824  BP: (!) 151/99 (!) 143/85 127/85 125/83  Pulse: 91 75 60 66  Resp: '16 17 17 16  '$ Temp: 98.5 F (36.9 C) 98.6 F  (37 C) 97.6 F (36.4 C) 97.9 F (36.6 C)  TempSrc: Oral Oral Oral Oral  SpO2: 97% 98% 99% 98%  Weight:      Height:        General: Pt is alert, awake, not in acute distress Cardiovascular: RRR, S1/S2 +, no rubs, no gallops Respiratory: CTA bilaterally, no wheezing, no rhonchi Abdominal: Soft, NT, ND, bowel sounds + Extremities: no edema, no cyanosis    The results of significant diagnostics from this hospitalization (including imaging, microbiology, ancillary and laboratory) are listed below for reference.     Microbiology: Recent Results (from the past 240 hour(s))  Culture, group A strep (throat)     Status: None   Collection Time: 11/03/22  9:36 AM   Specimen: Throat  Result Value Ref Range Status   Specimen Description THROAT  Final   Special Requests NONE  Final   Culture   Final    NO GROUP A STREP (S.PYOGENES) ISOLATED Performed at [facility], [address], [place], [place] [address]    Report Status 11/05/2022 FINAL  Final  Resp panel by RT-PCR (RSV, Flu A&B, Covid) Anterior Nasal Swab     Status: None   Collection Time: 11/05/22  6:20 AM   Specimen: Anterior Nasal Swab  Result Value Ref Range Status   SARS Coronavirus 2 by RT PCR NEGATIVE NEGATIVE Final    Comment: (NOTE) SARS-CoV-2 target nucleic acids are NOT DETECTED.  The SARS-CoV-2 RNA is generally detectable in upper respiratory specimens during the acute phase of infection. The lowest concentration of SARS-CoV-2 viral copies this assay can detect is 138 copies/mL. A negative result does not preclude SARS-Cov-2 infection and should not be used as the sole basis for treatment or other patient management decisions. A negative result may occur with  improper specimen collection/handling, submission of specimen other than nasopharyngeal swab, presence of viral mutation(s) within the areas targeted by this assay, and inadequate number of viral copies(<138 copies/mL). A negative result must be  combined with clinical observations, patient history, and epidemiological information. The expected result is Negative.  Fact Sheet for Patients:  EntrepreneurPulse.com.au  Fact Sheet for Healthcare Providers:  IncredibleEmployment.be  This test is no t yet approved or cleared by the [place] FDA and  has been authorized for detection and/or diagnosis of SARS-CoV-2 by FDA under an Emergency Use Authorization (EUA). This EUA will remain  in effect (meaning this test can be used) for the duration of the COVID-19 declaration under Section 564(b)(1) of the Act, 21 U.S.C.section 360bbb-3(b)(1), unless the authorization  is terminated  or revoked sooner.       Influenza A by PCR NEGATIVE NEGATIVE Final   Influenza B by PCR NEGATIVE NEGATIVE Final    Comment: (NOTE) The Xpert Xpress SARS-CoV-2/FLU/RSV plus assay is intended as an aid in the diagnosis of influenza from Nasopharyngeal swab specimens and should not be used as a sole basis for treatment. Nasal washings and aspirates are unacceptable for Xpert Xpress SARS-CoV-2/FLU/RSV testing.  Fact Sheet for Patients: EntrepreneurPulse.com.au  Fact Sheet for Healthcare Providers: IncredibleEmployment.be  This test is not yet approved or cleared by the [place] FDA and has been authorized for detection and/or diagnosis of SARS-CoV-2 by FDA under an Emergency Use Authorization (EUA). This EUA will remain in effect (meaning this test can be used) for the duration of the COVID-19 declaration under Section 564(b)(1) of the Act, 21 U.S.C. section 360bbb-3(b)(1), unless the authorization is terminated or revoked.     Resp Syncytial Virus by PCR NEGATIVE NEGATIVE Final    Comment: (NOTE) Fact Sheet for Patients: EntrepreneurPulse.com.au  Fact Sheet for Healthcare Providers: IncredibleEmployment.be  This test is not yet  approved or cleared by the [place] FDA and has been authorized for detection and/or diagnosis of SARS-CoV-2 by FDA under an Emergency Use Authorization (EUA). This EUA will remain in effect (meaning this test can be used) for the duration of the COVID-19 declaration under Section 564(b)(1) of the Act, 21 U.S.C. section 360bbb-3(b)(1), unless the authorization is terminated or revoked.  Performed at [facility], [address], [place], [place] [address]   Group A Strep by PCR     Status: None   Collection Time: 11/05/22  6:20 AM   Specimen: Anterior Nasal Swab; Sterile Swab  Result Value Ref Range Status   Group A Strep by PCR NOT DETECTED NOT DETECTED Final    Comment: Performed at [facility], [address] [place], [place] [address]     Labs: BNP (last 3 results) No results for input(s): "BNP" in the last 8760 hours. Basic Metabolic Panel: Recent Labs  Lab 11/05/22 0921 11/06/22 0221  NA 139 136  K 4.0 3.9  CL 103 104  CO2 26 23  GLUCOSE 119* 129*  BUN 14 15  CREATININE 0.77 0.71  CALCIUM 9.3 9.1   Liver Function Tests: No results for input(s): "AST", "ALT", "ALKPHOS", "BILITOT", "PROT", "ALBUMIN" in the last 168 hours. No results for input(s): "LIPASE", "AMYLASE" in the last 168 hours. No results for input(s): "AMMONIA" in the last 168 hours. CBC: Recent Labs  Lab 11/05/22 0921 11/06/22 0221  WBC 8.2 10.1  NEUTROABS 5.6  --   HGB 18.2* 17.5*  HCT 49.5 48.3  MCV 91.5 91.8  PLT 201 218   Cardiac Enzymes: No results for input(s): "CKTOTAL", "CKMB", "CKMBINDEX", "TROPONINI" in the last 168 hours. BNP: Invalid input(s): "POCBNP" CBG: No results for input(s): "GLUCAP" in the last 168 hours. D-Dimer No results for input(s): "DDIMER" in the last 72 hours. Hgb A1c No results for input(s): "HGBA1C" in the last 72 hours. Lipid Profile No results for input(s): "CHOL", "HDL", "LDLCALC", "TRIG", "CHOLHDL",  "LDLDIRECT" in the last 72 hours. Thyroid function studies No results for input(s): "TSH", "T4TOTAL", "T3FREE", "THYROIDAB" in the last 72 hours.  Invalid input(s): "FREET3" Anemia work up No results for input(s): "VITAMINB12", "FOLATE", "FERRITIN", "TIBC", "IRON", "RETICCTPCT" in the last 72 hours. Urinalysis    Component Value Date/Time   COLORURINE YELLOW 04/21/2022 [place] 04/21/2022 1229  LABSPEC 1.008 04/21/2022 1229   PHURINE 6.0 04/21/2022 1229   GLUCOSEU NEGATIVE 04/21/2022 1229   HGBUR SMALL (A) 04/21/2022 1229   BILIRUBINUR NEGATIVE 04/21/2022 1229   KETONESUR NEGATIVE 04/21/2022 1229   PROTEINUR NEGATIVE 04/21/2022 1229   UROBILINOGEN 0.2 08/26/2014 1122   NITRITE NEGATIVE 04/21/2022 1229   LEUKOCYTESUR NEGATIVE 04/21/2022 1229   Sepsis Labs Recent Labs  Lab 11/05/22 0921 11/06/22 0221  WBC 8.2 10.1   Microbiology Recent Results (from the past 240 hour(s))  Culture, group A strep (throat)     Status: None   Collection Time: 11/03/22  9:36 AM   Specimen: Throat  Result Value Ref Range Status   Specimen Description THROAT  Final   Special Requests NONE  Final   Culture   Final    NO GROUP A STREP (S.PYOGENES) ISOLATED Performed at [facility], [address] [place], [place] [address]    Report Status 11/05/2022 FINAL  Final  Resp panel by RT-PCR (RSV, Flu A&B, Covid) Anterior Nasal Swab     Status: None   Collection Time: 11/05/22  6:20 AM   Specimen: Anterior Nasal Swab  Result Value Ref Range Status   SARS Coronavirus 2 by RT PCR NEGATIVE NEGATIVE Final    Comment: (NOTE) SARS-CoV-2 target nucleic acids are NOT DETECTED.  The SARS-CoV-2 RNA is generally detectable in upper respiratory specimens during the acute phase of infection. The lowest concentration of SARS-CoV-2 viral copies this assay can detect is 138 copies/mL. A negative result does not preclude SARS-Cov-2 infection and should not be used as the sole basis for  treatment or other patient management decisions. A negative result may occur with  improper specimen collection/handling, submission of specimen other than nasopharyngeal swab, presence of viral mutation(s) within the areas targeted by this assay, and inadequate number of viral copies(<138 copies/mL). A negative result must be combined with clinical observations, patient history, and epidemiological information. The expected result is Negative.  Fact Sheet for Patients:  EntrepreneurPulse.com.au  Fact Sheet for Healthcare Providers:  IncredibleEmployment.be  This test is no t yet approved or cleared by the [place] FDA and  has been authorized for detection and/or diagnosis of SARS-CoV-2 by FDA under an Emergency Use Authorization (EUA). This EUA will remain  in effect (meaning this test can be used) for the duration of the COVID-19 declaration under Section 564(b)(1) of the Act, 21 U.S.C.section 360bbb-3(b)(1), unless the authorization is terminated  or revoked sooner.       Influenza A by PCR NEGATIVE NEGATIVE Final   Influenza B by PCR NEGATIVE NEGATIVE Final    Comment: (NOTE) The Xpert Xpress SARS-CoV-2/FLU/RSV plus assay is intended as an aid in the diagnosis of influenza from Nasopharyngeal swab specimens and should not be used as a sole basis for treatment. Nasal washings and aspirates are unacceptable for Xpert Xpress SARS-CoV-2/FLU/RSV testing.  Fact Sheet for Patients: EntrepreneurPulse.com.au  Fact Sheet for Healthcare Providers: IncredibleEmployment.be  This test is not yet approved or cleared by the [place] FDA and has been authorized for detection and/or diagnosis of SARS-CoV-2 by FDA under an Emergency Use Authorization (EUA). This EUA will remain in effect (meaning this test can be used) for the duration of the COVID-19 declaration under Section 564(b)(1) of the Act, 21  U.S.C. section 360bbb-3(b)(1), unless the authorization is terminated or revoked.     Resp Syncytial Virus by PCR NEGATIVE NEGATIVE Final    Comment: (NOTE) Fact Sheet for Patients: EntrepreneurPulse.com.au  Fact Sheet  for Healthcare Providers: IncredibleEmployment.be  This test is not yet approved or cleared by the [place] and has been authorized for detection and/or diagnosis of SARS-CoV-2 by FDA under an Emergency Use Authorization (EUA). This EUA will remain in effect (meaning this test can be used) for the duration of the COVID-19 declaration under Section 564(b)(1) of the Act, 21 U.S.C. section 360bbb-3(b)(1), unless the authorization is terminated or revoked.  Performed at [facility], [address], [place], [place] [address]   Group A Strep by PCR     Status: None   Collection Time: 11/05/22  6:20 AM   Specimen: Anterior Nasal Swab; Sterile Swab  Result Value Ref Range Status   Group A Strep by PCR NOT DETECTED NOT DETECTED Final    Comment: Performed at [facility], [address], [place], [place] [address]     Time coordinating discharge: Over 30 minutes  SIGNED:   Darliss Cheney, MD  [facility] 11/06/2022, 10:07 AM *Please note that this is a verbal dictation therefore any spelling or grammatical errors are due to the "[employer]" system interpretation. If 7PM-7AM, please contact night-coverage [email or web address]

## 2022-11-06 NOTE — Plan of Care (Signed)
  Problem: Clinical Measurements: Goal: Diagnostic test results will improve Outcome: Not Progressing   Problem: Nutrition: Goal: Adequate nutrition will be maintained Outcome: Not Progressing   Problem: Safety: Goal: Ability to remain free from injury will improve Outcome: Not Progressing   

## 2022-11-07 ENCOUNTER — Telehealth: Payer: Self-pay

## 2022-11-07 NOTE — Telephone Encounter (Signed)
Transition Care Management Follow-up Telephone Call  Call completed with assistance of Spanish Interpreter [id number]/[employer]  Date of discharge and from where: 11/06/2022, [facility] How have you been since you were released from the hospital? He stated he is feeling better.  He explained that he had hiccups last night and first thing this morning; but they are gone now.   Any questions or concerns? No  Items Reviewed: Did the pt receive and understand the discharge instructions provided? Yes  Medications obtained and verified? Yes - he said he has all of his medications and did not have any questions about the med regime   Other? No  Any new allergies since your discharge? No  Dietary orders reviewed? Yes- he said he is tolerating a regular diet without any problems.  Do you have support at home? Yes , his wife  [place] and Equipment/Supplies: Were home health services ordered? no If so, what is the name of the agency? N/a  Has the agency set up a time to come to the patient's home? not applicable Were any new equipment or medical supplies ordered?  No What is the name of the medical supply agency? N/a Were you able to get the supplies/equipment? not applicable Do you have any questions related to the use of the equipment or supplies? No  Functional Questionnaire: (I = Independent and D = Dependent) ADLs: independent  Follow up appointments reviewed:  PCP Hospital f/u appt confirmed? Yes  Scheduled to see Geryl Rankins, NP - 12/02/2022.   [facility] f/u appt confirmed?  None scheduled at this time.    Are transportation arrangements needed? No  If their condition worsens, is the pt aware to call PCP or go to the Emergency Dept.? Yes Was the patient provided with contact information for the PCP's office or ED? Yes Was to pt encouraged to call back with questions or concerns? Yes

## 2022-11-11 ENCOUNTER — Other Ambulatory Visit: Payer: Self-pay

## 2022-12-02 ENCOUNTER — Other Ambulatory Visit: Payer: Self-pay

## 2022-12-02 ENCOUNTER — Ambulatory Visit: Payer: Self-pay | Attending: Nurse Practitioner | Admitting: Nurse Practitioner

## 2022-12-02 ENCOUNTER — Encounter: Payer: Self-pay | Admitting: Nurse Practitioner

## 2022-12-02 VITALS — BP 128/79 | HR 71 | Ht 65.0 in | Wt 177.2 lb

## 2022-12-02 DIAGNOSIS — Z09 Encounter for follow-up examination after completed treatment for conditions other than malignant neoplasm: Secondary | ICD-10-CM

## 2022-12-02 DIAGNOSIS — J0430 Supraglottitis, unspecified, without obstruction: Secondary | ICD-10-CM

## 2022-12-02 NOTE — Progress Notes (Signed)
Assessment & Plan:  Shawn Porter was seen today for hospitalization follow-up.  Diagnoses and all orders for this visit:  Hospital discharge follow-up All symptoms currently resolved.  Supraglottitis without airway obstruction -     CMP14+EGFR -     CBC with Differential    Patient has been counseled on age-appropriate routine health concerns for screening and prevention. These are reviewed and up-to-date. Referrals have been placed accordingly. Immunizations are up-to-date or declined.    Subjective:   Chief Complaint  Patient presents with   Hospitalization Follow-up   [name] 52 y.o. male presents to office today for HFU  He has a past medical history of hyperlipidemia and hypertension  HFU Presented to the hospital on November 05, 2022 with ongoing sore throat for the past 5 days.  CT of the soft tissue of neck showed tonsillitis with pharyngitis and supraglottitis with purulence of the right tonsillar fossa.  There is also evidence of significant mucosal edema.  He was treated with Unasyn, fentanyl, Decadron and IV fluids.  ENT was consulted and patient had improved at that time.  He was discharged on tapering dose of prednisone for 12 days as well as 10 days of oral Augmentin.  Today he is feeling good and denies any sore throat or fever.  He has completed all medications as prescribed.  Will obtain a CBC and CMP today    Review of Systems  Constitutional:  Negative for fever, malaise/fatigue and weight loss.  HENT: Negative.  Negative for nosebleeds.   Eyes: Negative.  Negative for blurred vision, double vision and photophobia.  Respiratory: Negative.  Negative for cough and shortness of breath.   Cardiovascular: Negative.  Negative for chest pain, palpitations and leg swelling.  Gastrointestinal: Negative.  Negative for heartburn, nausea and vomiting.  Musculoskeletal: Negative.  Negative for myalgias.  Neurological: Negative.  Negative for dizziness, focal  weakness, seizures and headaches.  Psychiatric/Behavioral: Negative.  Negative for suicidal ideas.     Past Medical History:  Diagnosis Date   Hyperlipidemia    Hypertension     Past Surgical History:  Procedure Laterality Date   HERNIA REPAIR      Family History  Problem Relation Age of Onset   Healthy Mother    Heart failure Father    Diabetes Sister     Social History Reviewed with no changes to be made today.   Outpatient Medications Prior to Visit  Medication Sig Dispense Refill   amLODipine (NORVASC) 10 MG tablet Take 1 tablet (10 mg total) by mouth daily. 90 tablet 1   aspirin EC 81 MG tablet Take 81 mg by mouth 3 (three) times a week. Swallow whole.     rosuvastatin (CRESTOR) 20 MG tablet TAKE 1 TABLET (20 MG TOTAL) BY MOUTH DAILY. (Patient taking differently: Take 20 mg by mouth daily.) 90 tablet 1   lidocaine (XYLOCAINE) 2 % solution Use as directed 15 mLs in the mouth or throat every 3 (three) hours as needed for mouth pain. (Patient not taking: Reported on 12/02/2022) 100 mL 0   omega-3 acid ethyl esters (LOVAZA) 1 g capsule Take 2 capsules (2 g total) by mouth 2 (two) times daily. (Patient not taking: Reported on 12/02/2022) 360 capsule 1   predniSONE (DELTASONE) 10 MG tablet Take 4 tablets p.o. daily for 3 days, followed by 3 tablets p.o. daily for 3 days, followed by 2 tablets p.o. daily for 3 days followed by 1 tablet p.o. daily for 3 days. (  Patient not taking: Reported on 12/02/2022) 30 tablet 0   No facility-administered medications prior to visit.    Allergies  Allergen Reactions   Other Rash       Objective:    BP 128/79   Pulse 71   Ht '5\' 5"'$  (1.651 m)   Wt 177 lb 3.2 oz (80.4 kg)   SpO2 95%   BMI 29.49 kg/m  Wt Readings from Last 3 Encounters:  12/02/22 177 lb 3.2 oz (80.4 kg)  11/05/22 175 lb (79.4 kg)  11/03/22 175 lb (79.4 kg)    Physical Exam Vitals and nursing note reviewed.  Constitutional:      Appearance: He is well-developed.  HENT:      Head: Normocephalic and atraumatic.  Cardiovascular:     Rate and Rhythm: Normal rate and regular rhythm.     Heart sounds: Normal heart sounds. No murmur heard.    No friction rub. No gallop.  Pulmonary:     Effort: Pulmonary effort is normal. No tachypnea or respiratory distress.     Breath sounds: Normal breath sounds. No decreased breath sounds, wheezing, rhonchi or rales.  Chest:     Chest wall: No tenderness.  Abdominal:     General: Bowel sounds are normal.     Palpations: Abdomen is soft.  Musculoskeletal:        General: Normal range of motion.     Cervical back: Normal range of motion.  Skin:    General: Skin is warm and dry.  Neurological:     Mental Status: He is alert and oriented to person, place, and time.     Coordination: Coordination normal.  Psychiatric:        Behavior: Behavior normal. Behavior is cooperative.        Thought Content: Thought content normal.        Judgment: Judgment normal.          Patient has been counseled extensively about nutrition and exercise as well as the importance of adherence with medications and regular follow-up. The patient was given clear instructions to go to ER or return to medical center if symptoms don't improve, worsen or new problems develop. The patient verbalized understanding.   Follow-up: Return in about 3 months (around 03/02/2023) for HTN.   Gildardo Pounds, FNP-BC [facility] and [place], [place]   12/02/2022, 3:16 PM

## 2022-12-03 LAB — CMP14+EGFR
ALT: 58 IU/L — ABNORMAL HIGH (ref 0–44)
AST: 29 IU/L (ref 0–40)
Albumin/Globulin Ratio: 2.7 — ABNORMAL HIGH (ref 1.2–2.2)
Albumin: 4.8 g/dL (ref 3.8–4.9)
Alkaline Phosphatase: 78 IU/L (ref 44–121)
BUN/Creatinine Ratio: 14 (ref 9–20)
BUN: 12 mg/dL (ref 6–24)
Bilirubin Total: 1.5 mg/dL — ABNORMAL HIGH (ref 0.0–1.2)
CO2: 24 mmol/L (ref 20–29)
Calcium: 9.4 mg/dL (ref 8.7–10.2)
Chloride: 103 mmol/L (ref 96–106)
Creatinine, Ser: 0.85 mg/dL (ref 0.76–1.27)
Globulin, Total: 1.8 g/dL (ref 1.5–4.5)
Glucose: 95 mg/dL (ref 70–99)
Potassium: 4.5 mmol/L (ref 3.5–5.2)
Sodium: 140 mmol/L (ref 134–144)
Total Protein: 6.6 g/dL (ref 6.0–8.5)
eGFR: 105 mL/min/{1.73_m2} (ref >59)

## 2022-12-03 LAB — CBC WITH DIFFERENTIAL/PLATELET
Basophils Absolute: 0.1 10*3/uL (ref 0.0–0.2)
Basos: 1 %
EOS (ABSOLUTE): 0.2 10*3/uL (ref 0.0–0.4)
Eos: 3 %
Hematocrit: 49.6 % (ref 37.5–51.0)
Hemoglobin: 17.6 g/dL (ref 13.0–17.7)
Immature Grans (Abs): 0 10*3/uL (ref 0.0–0.1)
Immature Granulocytes: 0 %
Lymphocytes Absolute: 1.7 10*3/uL (ref 0.7–3.1)
Lymphs: 39 %
MCH: 33.5 pg — ABNORMAL HIGH (ref 26.6–33.0)
MCHC: 35.5 g/dL (ref 31.5–35.7)
MCV: 95 fL (ref 79–97)
Monocytes Absolute: 0.4 10*3/uL (ref 0.1–0.9)
Monocytes: 9 %
Neutrophils Absolute: 2.1 10*3/uL (ref 1.4–7.0)
Neutrophils: 48 %
Platelets: 212 10*3/uL (ref 150–450)
RBC: 5.25 x10E6/uL (ref 4.14–5.80)
RDW: 12.5 % (ref 11.6–15.4)
WBC: 4.4 10*3/uL (ref 3.4–10.8)

## 2022-12-06 ENCOUNTER — Ambulatory Visit: Payer: Self-pay

## 2022-12-06 NOTE — Telephone Encounter (Signed)
Call placed to patient unable to reach message left on VM.  

## 2022-12-06 NOTE — Telephone Encounter (Signed)
    Chief Complaint: BP 147/94 with mild headache.Currently out of town working. Asking to be worked in Thursday afternoon or Friday.                                                                                                                                                                                                                                                             Symptoms: Above Frequency: Weekend Pertinent Negatives: Patient denies any other symptoms. Disposition: '[]'$ ED /'[]'$ Urgent Care (no appt availability in office) / '[]'$ Appointment(In office/virtual)/ '[]'$  [place] [facility]/ '[]'$ Home Care/ '[]'$ Refused Recommended Disposition /'[]'$ [place] [facility]/ '[x]'$  Follow-up with PCP Additional Notes: Please advise pt. Thanks.  Answer Assessment - Initial Assessment Questions 1. BLOOD PRESSURE: "What is the blood pressure?" "Did you take at least two measurements 5 minutes apart?"     147/94   14/96 2. ONSET: "When did you take your blood pressure?"     Yesterday 3. HOW: "How did you take your blood pressure?" (e.g., automatic home BP monitor, visiting nurse)     Home cuff 4. HISTORY: "Do you have a history of high blood pressure?"     Yes 5. MEDICINES: "Are you taking any medicines for blood pressure?" "Have you missed any doses recently?"     Yes 6. OTHER SYMPTOMS: "Do you have any symptoms?" (e.g., blurred vision, chest pain, difficulty breathing, headache, weakness)     Headache 7. PREGNANCY: "Is there any chance you are pregnant?" "When was your last menstrual period?"     N/a  Protocols used: Blood Pressure - High-A-AH

## 2022-12-09 ENCOUNTER — Ambulatory Visit: Payer: Self-pay

## 2022-12-09 ENCOUNTER — Telehealth: Payer: Self-pay | Admitting: Nurse Practitioner

## 2022-12-09 NOTE — Telephone Encounter (Signed)
t stated his BP yesterday was 145/107 and today while on the phone with me it is 142/92. Stated he was told by a nurse he had an appointment today but could not remember the time. No appointments are scheduled for today; no appointments are available to schedule. Pt is concerned about his blood pressure fluctuating.   Pt seeking clinical advice.   Chief Complaint: BP 142/93. Asking to be worked in [employer]. Symptoms: Headache Frequency:  Pertinent Negatives: Patient denies any other symptoms Disposition: []$ ED /[]$ Urgent Care (no appt availability in office) / []$ Appointment(In office/virtual)/ []$  [facility]/ []$ Home Care/ []$ Refused Recommended Disposition /[]$ [place] [facility]/ [x]$  Follow-up with PCP Additional Notes: Please advise pt.  Answer Assessment - Initial Assessment Questions 1. BLOOD PRESSURE: "What is the blood pressure?" "Did you take at least two measurements 5 minutes apart?"     142/93 2. ONSET: "When did you take your blood pressure?"     Today 3. HOW: "How did you take your blood pressure?" (e.g., automatic home BP monitor, visiting nurse)     Home cuff 4. HISTORY: "Do you have a history of high blood pressure?"     yes 5. MEDICINES: "Are you taking any medicines for blood pressure?" "Have you missed any doses recently?"     No missed doses 6. OTHER SYMPTOMS: "Do you have any symptoms?" (e.g., blurred vision, chest pain, difficulty breathing, headache, weakness)     Headache 7. PREGNANCY: "Is there any chance you are pregnant?" "When was your last menstrual period?"     N/a  Protocols used: Blood Pressure - High-A-AH

## 2022-12-09 NOTE — Telephone Encounter (Signed)
Patient requested appointment for Monday. Scheduled at primary care for Monday.

## 2022-12-09 NOTE — Telephone Encounter (Signed)
Copied from [place]. Topic: Appointment Scheduling - Scheduling Inquiry for Clinic >> Dec 09, 2022 10:41 AM Shawn Porter wrote: Reason for CRM: Pt calling and stated he was told by a nurse he had an appointment today. He just can't remember the appointment time. No appointment are scheduled for pt today.   Please advise.

## 2022-12-12 ENCOUNTER — Other Ambulatory Visit: Payer: Self-pay

## 2022-12-12 NOTE — Progress Notes (Unsigned)
Patient ID: Shawn Porter, male    DOB: 08/25/1971  MRN: [id number]  CC: Blood Pressure Check   Subjective: Shawn Porter is a 52 y.o. male who presents for blood pressure check.  His concerns today include:  Last appt 12/02/22 with Meredeth Ide, NP   Amlodipine, last appt for bp on 07/01/2022 with Meredeth Ide, NP   2/9/224 per triage call note: Reason for CRM: Pt calling and stated he was told by a nurse he had an appointment today. He just can't remember the appointment time. No appointment are scheduled for pt today. Please advise.  12/09/2022 per triage RN call: t stated his BP yesterday was 145/107 and today while on the phone with me it is 142/92. Stated he was told by a nurse he had an appointment today but could not remember the time. No appointments are scheduled for today; no appointments are available to schedule. Pt is concerned about his blood pressure fluctuating.   Pt seeking clinical advice.    Chief Complaint: BP 142/93. Asking to be worked in [employer] Symptoms: Headache Frequency:  Pertinent Negatives: Patient denies any other symptoms Disposition: [] ED /[] Urgent Care (no appt availability in office) / [] Appointment(In office/virtual)/ []  [place] [facility]/ [] Home Care/ [] Refused Recommended Disposition /[] [place] [facility]/ [x]  Follow-up with PCP Additional Notes: Please advise pt.  Answer Assessment - Initial Assessment Questions 1. BLOOD PRESSURE: "What is the blood pressure?" "Did you take at least two measurements 5 minutes apart?"     142/93 2. ONSET: "When did you take your blood pressure?"     Today 3. HOW: "How did you take your blood pressure?" (e.g., automatic home BP monitor, visiting nurse)     Home cuff 4. HISTORY: "Do you have a history of high blood pressure?"     yes 5. MEDICINES: "Are you taking any medicines for blood pressure?" "Have you missed any doses recently?"     No missed doses 6. OTHER SYMPTOMS: "Do you have any symptoms?"  (e.g., blurred vision, chest pain, difficulty breathing, headache, weakness)     Headache 7. PREGNANCY: "Is there any chance you are pregnant?" "When was your last menstrual period?"     N/a     Today's visit 12/13/2022:  Patient Active Problem List   Diagnosis Date Noted   Supraglottitis 11/05/2022   Discoloration of eye 11/23/2016   Palpitations 11/23/2016   Dyslipidemia 11/23/2016   Wound infection after surgery 11/06/2015   Sebaceous cyst 10/19/2015   Health care maintenance 10/01/2015   Right inguinal hernia 10/27/2014   Hypertriglyceridemia 10/27/2014   Pain in joint, shoulder region 10/27/2014   Essential hypertension, benign 08/26/2014   Dental caries 08/26/2014   Radicular pain in right arm 10/16/2013   Right arm pain 07/19/2013     Current Outpatient Medications on File Prior to Visit  Medication Sig Dispense Refill   amLODipine (NORVASC) 10 MG tablet Take 1 tablet (10 mg total) by mouth daily. 90 tablet 1   aspirin EC 81 MG tablet Take 81 mg by mouth 3 (three) times a week. Swallow whole.     omega-3 acid ethyl esters (LOVAZA) 1 g capsule Take 2 capsules (2 g total) by mouth 2 (two) times daily. (Patient not taking: Reported on 12/02/2022) 360 capsule 1   rosuvastatin (CRESTOR) 20 MG tablet TAKE 1 TABLET (20 MG TOTAL) BY MOUTH DAILY. (Patient taking differently: Take 20 mg by mouth daily.) 90 tablet 1   No current facility-administered medications on file prior to  visit.    Allergies  Allergen Reactions   Other Rash    Social History   Socioeconomic History   Marital status: Married    Spouse name: Not on file   Number of children: Not on file   Years of education: Not on file   Highest education level: Not on file  Occupational History   Not on file  Tobacco Use   Smoking status: Never   Smokeless tobacco: Never  Vaping Use   Vaping Use: Never used  Substance and Sexual Activity   Alcohol use: Not Currently    Comment: occasionally   Drug use: No    Sexual activity: Not on file  Other Topics Concern   Not on file  Social History Narrative   Not on file   Social Determinants of Health   Financial Resource Strain: Not on file  Food Insecurity: No Food Insecurity (11/05/2022)   Hunger Vital Sign    Worried About Running Out of Food in the Last Year: Never true    Ran Out of Food in the Last Year: Never true  Transportation Needs: No Transportation Needs (11/05/2022)   PRAPARE - Administrator, Civil Service (Medical): No    Lack of Transportation (Non-Medical): No  Physical Activity: Not on file  Stress: Not on file  Social Connections: Not on file  Intimate Partner Violence: Not At Risk (11/05/2022)   Humiliation, Afraid, Rape, and Kick questionnaire    Fear of Current or Ex-Partner: No    Emotionally Abused: No    Physically Abused: No    Sexually Abused: No    Family History  Problem Relation Age of Onset   Healthy Mother    Heart failure Father    Diabetes Sister     Past Surgical History:  Procedure Laterality Date   HERNIA REPAIR      ROS: Review of Systems Negative except as stated above  PHYSICAL EXAM: There were no vitals taken for this visit.  Physical Exam  {male adult master:310786} {male adult master:310785}     Latest Ref Rng & Units 12/02/2022    3:12 PM 11/06/2022    2:21 AM 11/05/2022    9:21 AM  CMP  Glucose 70 - 99 mg/dL 95  161  096   BUN 6 - 24 mg/dL 12  15  14    Creatinine 0.76 - 1.27 mg/dL 0.45  4.09  8.11   Sodium 134 - 144 mmol/L 140  136  139   Potassium 3.5 - 5.2 mmol/L 4.5  3.9  4.0   Chloride 96 - 106 mmol/L 103  104  103   CO2 20 - 29 mmol/L 24  23  26    Calcium 8.7 - 10.2 mg/dL 9.4  9.1  9.3   Total Protein 6.0 - 8.5 g/dL 6.6     Total Bilirubin 0.0 - 1.2 mg/dL 1.5     Alkaline Phos 44 - 121 IU/L 78     AST 0 - 40 IU/L 29     ALT 0 - 44 IU/L 58      Lipid Panel     Component Value Date/Time   CHOL 219 (H) 02/28/2022 1008   TRIG 301 (H) 02/28/2022 1008   HDL  40 02/28/2022 1008   CHOLHDL 5.5 (H) 02/28/2022 1008   CHOLHDL 6.1 (H) 10/01/2015 1203   VLDL 46 (H) 10/01/2015 1203   LDLCALC 126 (H) 02/28/2022 1008    CBC    Component Value  Date/Time   WBC 4.4 12/02/2022 1512   WBC 10.1 11/06/2022 0221   RBC 5.25 12/02/2022 1512   RBC 5.26 11/06/2022 0221   HGB 17.6 12/02/2022 1512   HCT 49.6 12/02/2022 1512   PLT 212 12/02/2022 1512   MCV 95 12/02/2022 1512   MCH 33.5 (H) 12/02/2022 1512   MCH 33.3 11/06/2022 0221   MCHC 35.5 12/02/2022 1512   MCHC 36.2 (H) 11/06/2022 0221   RDW 12.5 12/02/2022 1512   LYMPHSABS 1.7 12/02/2022 1512   MONOABS 0.5 11/05/2022 0921   EOSABS 0.2 12/02/2022 1512   BASOSABS 0.1 12/02/2022 1512    ASSESSMENT AND PLAN:  There are no diagnoses linked to this encounter.   Patient was given the opportunity to ask questions.  Patient verbalized understanding of the plan and was able to repeat key elements of the plan. Patient was given clear instructions to go to Emergency Department or return to medical center if symptoms don't improve, worsen, or new problems develop.The patient verbalized understanding.   No orders of the defined types were placed in this encounter.    Requested Prescriptions    No prescriptions requested or ordered in this encounter    No follow-ups on file.  Rema Fendt, NP

## 2022-12-13 ENCOUNTER — Other Ambulatory Visit: Payer: Self-pay

## 2022-12-13 ENCOUNTER — Encounter: Payer: Self-pay | Admitting: Family

## 2022-12-13 DIAGNOSIS — I10 Essential (primary) hypertension: Secondary | ICD-10-CM

## 2022-12-14 NOTE — Progress Notes (Signed)
Erroneous encounter-disregard

## 2022-12-16 ENCOUNTER — Encounter: Payer: Self-pay | Admitting: Family

## 2022-12-29 ENCOUNTER — Emergency Department (HOSPITAL_BASED_OUTPATIENT_CLINIC_OR_DEPARTMENT_OTHER): Payer: Self-pay

## 2022-12-29 ENCOUNTER — Other Ambulatory Visit: Payer: Self-pay

## 2022-12-29 ENCOUNTER — Emergency Department (HOSPITAL_BASED_OUTPATIENT_CLINIC_OR_DEPARTMENT_OTHER)
Admission: EM | Admit: 2022-12-29 | Discharge: 2022-12-29 | Disposition: A | Discharge status: Home / Self Care | Payer: Self-pay | Attending: Emergency Medicine | Admitting: Emergency Medicine

## 2022-12-29 ENCOUNTER — Encounter (HOSPITAL_BASED_OUTPATIENT_CLINIC_OR_DEPARTMENT_OTHER): Payer: Self-pay | Admitting: Emergency Medicine

## 2022-12-29 DIAGNOSIS — R519 Headache, unspecified: Secondary | ICD-10-CM | POA: Insufficient documentation

## 2022-12-29 DIAGNOSIS — Z79899 Other long term (current) drug therapy: Secondary | ICD-10-CM | POA: Insufficient documentation

## 2022-12-29 DIAGNOSIS — R5383 Other fatigue: Secondary | ICD-10-CM | POA: Insufficient documentation

## 2022-12-29 DIAGNOSIS — Z7982 Long term (current) use of aspirin: Secondary | ICD-10-CM | POA: Insufficient documentation

## 2022-12-29 DIAGNOSIS — I10 Essential (primary) hypertension: Secondary | ICD-10-CM | POA: Insufficient documentation

## 2022-12-29 LAB — COMPREHENSIVE METABOLIC PANEL
ALT: 43 U/L (ref 0–44)
AST: 31 U/L (ref 15–41)
Albumin: 4.5 g/dL (ref 3.5–5.0)
Alkaline Phosphatase: 63 U/L (ref 38–126)
Anion gap: 9 (ref 5–15)
BUN: 13 mg/dL (ref 6–20)
CO2: 23 mmol/L (ref 22–32)
Calcium: 9.3 mg/dL (ref 8.9–10.3)
Chloride: 107 mmol/L (ref 98–111)
Creatinine, Ser: 0.65 mg/dL (ref 0.61–1.24)
GFR, Estimated: >60 mL/min (ref >60)
Glucose, Bld: 126 mg/dL — ABNORMAL HIGH (ref 70–99)
Potassium: 3.9 mmol/L (ref 3.5–5.1)
Sodium: 139 mmol/L (ref 135–145)
Total Bilirubin: 2.2 mg/dL — ABNORMAL HIGH (ref 0.3–1.2)
Total Protein: 6.7 g/dL (ref 6.5–8.1)

## 2022-12-29 LAB — URINALYSIS, ROUTINE W REFLEX MICROSCOPIC
Bilirubin Urine: NEGATIVE
Glucose, UA: NEGATIVE mg/dL
Hgb urine dipstick: NEGATIVE
Ketones, ur: NEGATIVE mg/dL
Leukocytes,Ua: NEGATIVE
Nitrite: NEGATIVE
Protein, ur: NEGATIVE mg/dL
Specific Gravity, Urine: 1.008 (ref 1.005–1.030)
pH: 6.5 (ref 5.0–8.0)

## 2022-12-29 LAB — CBC WITH DIFFERENTIAL/PLATELET
Abs Immature Granulocytes: 0.01 10*3/uL (ref 0.00–0.07)
Basophils Absolute: 0.1 10*3/uL (ref 0.0–0.1)
Basophils Relative: 1 %
Eosinophils Absolute: 0.2 10*3/uL (ref 0.0–0.5)
Eosinophils Relative: 5 %
HCT: 47 % (ref 39.0–52.0)
Hemoglobin: 17.3 g/dL — ABNORMAL HIGH (ref 13.0–17.0)
Immature Granulocytes: 0 %
Lymphocytes Relative: 38 %
Lymphs Abs: 1.9 10*3/uL (ref 0.7–4.0)
MCH: 32.5 pg (ref 26.0–34.0)
MCHC: 36.8 g/dL — ABNORMAL HIGH (ref 30.0–36.0)
MCV: 88.3 fL (ref 80.0–100.0)
Monocytes Absolute: 0.4 10*3/uL (ref 0.1–1.0)
Monocytes Relative: 8 %
Neutro Abs: 2.3 10*3/uL (ref 1.7–7.7)
Neutrophils Relative %: 48 %
Platelets: 230 10*3/uL (ref 150–400)
RBC: 5.32 MIL/uL (ref 4.22–5.81)
RDW: 11.8 % (ref 11.5–15.5)
WBC: 4.9 10*3/uL (ref 4.0–10.5)
nRBC: 0 % (ref 0.0–0.2)

## 2022-12-29 LAB — LIPASE, BLOOD: Lipase: 13 U/L (ref 11–51)

## 2022-12-29 MED ORDER — KETOROLAC TROMETHAMINE 15 MG/ML IJ SOLN
15.0000 mg | Freq: Once | INTRAMUSCULAR | Status: AC
  Administered 2022-12-29: 15 mg via INTRAVENOUS
  Filled 2022-12-29: qty 1

## 2022-12-29 NOTE — Discharge Instructions (Addendum)
Tome Tylenol y Motrin para sus dolores de [place]. Puede tomar 1000 mg de Tylenol cada 6 horas y 600 mg de ibuprofeno cada 6 horas segn sea necesario para sus sntomas. Puede tomar estos medicamentos juntos segn sea necesario, al mismo tiempo o alternndolos cada 3 horas.  Please take Tylenol and Motrin for your headaches.  You can take 1000 mg of Tylenol every 6 hours and 600 mg of ibuprofen every 6 hours as needed for your symptoms.  You can take these medicines together as needed, either at the same time, or alternating every 3 hours.

## 2022-12-29 NOTE — ED Triage Notes (Signed)
Pt arrives to ED with c/o headache, hypertension, yellow-tinged eyes, and fatigue x1 month.

## 2022-12-29 NOTE — ED Provider Notes (Signed)
[facility] Provider Note  CSN: [id number] Arrival date & time: 12/29/22 1215  Chief Complaint(s) Headache  [name] is a 52 y.o. male with history of hypertension, hyperlipidemia presenting to the emergency department with headache.  Patient reports around 1 month of headache.  He denies any other symptoms such as numbness or tingling, weakness, facial droop, vision changes.  He denies hitting his head.  He also believes that his eyes have turned yellow recently.  He reports generalized fatigue.  No abdominal pain, nausea or vomiting.  No pale stools or diarrhea or melena.  No swelling.  Symptoms mild.  He is also concerned about his blood pressure because it was 0000000 systolic  Qualified spanish healthcare interpreter used  Past Medical History Past Medical History:  Diagnosis Date   Hyperlipidemia    Hypertension    Patient Active Problem List   Diagnosis Date Noted   Supraglottitis 11/05/2022   Discoloration of eye 11/23/2016   Palpitations 11/23/2016   Dyslipidemia 11/23/2016   Wound infection after surgery 11/06/2015   Sebaceous cyst 10/19/2015   Health care maintenance 10/01/2015   Right inguinal hernia 10/27/2014   Hypertriglyceridemia 10/27/2014   Pain in joint, shoulder region 10/27/2014   Essential hypertension, benign 08/26/2014   Dental caries 08/26/2014   Radicular pain in right arm 10/16/2013   Right arm pain 07/19/2013   Home Medication(s) Prior to Admission medications   Medication Sig Start Date End Date Taking? Authorizing Provider  amLODipine (NORVASC) 10 MG tablet Take 1 tablet (10 mg total) by mouth daily. 07/01/22   Gildardo Pounds, NP  aspirin EC 81 MG tablet Take 81 mg by mouth 3 (three) times a week. Swallow whole.    [provider]  omega-3 acid ethyl esters (LOVAZA) 1 g capsule Take 2 capsules (2 g total) by mouth 2 (two) times daily. Patient not taking: Reported on 12/02/2022 07/01/22  01/01/23  Gildardo Pounds, NP  rosuvastatin (CRESTOR) 20 MG tablet TAKE 1 TABLET (20 MG TOTAL) BY MOUTH DAILY. Patient taking differently: Take 20 mg by mouth daily. 07/01/22 03/13/23  Gildardo Pounds, NP                                                                                                                                    Past Surgical History Past Surgical History:  Procedure Laterality Date   HERNIA REPAIR     Family History Family History  Problem Relation Age of Onset   Healthy Mother    Heart failure Father    Diabetes Sister     Social History Social History   Tobacco Use   Smoking status: Never   Smokeless tobacco: Never  Vaping Use   Vaping Use: Never used  Substance Use Topics   Alcohol use: Not Currently    Comment: occasionally   Drug use: No   Allergies Other  Review of Systems Review of Systems  All other systems reviewed and are negative.   Physical Exam Vital Signs  I have reviewed the triage vital signs BP 128/82   Pulse 72   Temp 98.4 F (36.9 C)   Resp 18   Ht '5\' 5"'$  (1.651 m)   Wt 80.4 kg   SpO2 100%   BMI 29.50 kg/m  Physical Exam Vitals and nursing note reviewed.  Constitutional:      General: He is not in acute distress.    Appearance: Normal appearance.  HENT:     Mouth/Throat:     Mouth: Mucous membranes are moist.  Eyes:     General: Scleral icterus: ?minimally icteric?Marland Kitchen     Conjunctiva/sclera: Conjunctivae normal.  Cardiovascular:     Rate and Rhythm: Normal rate and regular rhythm.  Pulmonary:     Effort: Pulmonary effort is normal. No respiratory distress.     Breath sounds: Normal breath sounds.  Abdominal:     General: Abdomen is flat.     Palpations: Abdomen is soft.     Tenderness: There is no abdominal tenderness.  Musculoskeletal:     Right lower leg: No edema.     Left lower leg: No edema.  Skin:    General: Skin is warm and dry.     Capillary Refill: Capillary refill takes less than 2 seconds.   Neurological:     Mental Status: He is alert and oriented to person, place, and time. Mental status is at baseline.     Comments: Cranial nerves II through XII intact, strength 5 out of 5 in the bilateral upper and lower extremities, no sensory deficit to light touch, no dysmetria on finger-nose-finger testing  Psychiatric:        Mood and Affect: Mood normal.        Behavior: Behavior normal.     ED Results and Treatments Labs (all labs ordered are listed, but only abnormal results are displayed) Labs Reviewed  COMPREHENSIVE METABOLIC PANEL - Abnormal; Notable for the following components:      Result Value   Glucose, Bld 126 (*)    Total Bilirubin 2.2 (*)    All other components within normal limits  CBC WITH DIFFERENTIAL/PLATELET - Abnormal; Notable for the following components:   Hemoglobin 17.3 (*)    MCHC 36.8 (*)    All other components within normal limits  LIPASE, BLOOD  URINALYSIS, ROUTINE W REFLEX MICROSCOPIC                                                                                                                          Radiology CT Head Wo Contrast  Result Date: 12/29/2022 CLINICAL DATA:  Headache EXAM: CT HEAD WITHOUT CONTRAST TECHNIQUE: Contiguous axial images were obtained from the base of the skull through the vertex without intravenous contrast. RADIATION DOSE REDUCTION: This exam was performed according to the departmental dose-optimization program which includes automated exposure control, adjustment of the mA  and/or kV according to patient size and/or use of iterative reconstruction technique. COMPARISON:  07/12/2013 FINDINGS: Brain: No evidence of acute infarction, hemorrhage, hydrocephalus, extra-axial collection or mass lesion/mass effect. Vascular: No hyperdense vessel or unexpected calcification. Skull: Normal. Negative for fracture or focal lesion. Sinuses/Orbits: No acute finding. Other: None. IMPRESSION: No acute intracranial pathology.  Electronically Signed   By: Delanna Ahmadi M.D.   On: 12/29/2022 14:14    Pertinent labs & imaging results that were available during my care of the patient were reviewed by me and considered in my medical decision making (see MDM for details).  Medications Ordered in ED Medications  ketorolac (TORADOL) 15 MG/ML injection 15 mg (15 mg Intravenous Given 12/29/22 1348)                                                                                                                                     Procedures Procedures  (including critical care time)  Medical Decision Making / ED Course   MDM:  52 year old male presenting to the emergency department with headaches.  Patient well-appearing, neurologic exam is reassuring.  Patient denies any history of headache, given age will obtain CT scan to evaluate for acute intracranial process or developing intracranial process such as tumor or mass effect.  Doubt subarachnoid hemorrhage given length of symptoms.  Denies risk factors for carbon monoxide poisoning and no one else at home is headaches.  Patient also concerning that his eyes are turning yellow, on my evaluation may be slightly icteric but hard to say for sure.  Will check labs including CMP to evaluate for jaundice.  He has no abdominal pain to suggest acute intra-abdominal process and no abdominal tenderness especially not in his right upper quadrant to suggest hepatitis or cholecystitis.  Will reassess.  Will treat headaches.  Patient concerned about his blood pressure but only mildly elevated in the emergency department.  Clinical Course as of 12/29/22 1549  Thu Dec 29, 2022  1547 CT head negative. Labs reassuring, does show very mild hyperbilirubinemia which appears chronic without signs of LFT abnormalities. Patient may have benign process such as gilbert's syndrome. Headache is resolved. Discussed self care for headaches and advised f/u with PMD. Will discharge patient to home. All  questions answered. Patient comfortable with plan of discharge. Return precautions discussed with patient and specified on the after visit summary. [WS]    Clinical Course User Index [WS] Cristie Hem, MD     Additional history obtained: -Additional history obtained from spouse -External records from outside source obtained and reviewed including: Chart review including previous notes, labs, imaging, consultation notes including admission 11/05/22   Lab Tests: -I ordered, reviewed, and interpreted labs.   The pertinent results include:   Labs Reviewed  COMPREHENSIVE METABOLIC PANEL - Abnormal; Notable for the following components:      Result Value   Glucose, Bld 126 (*)    Total Bilirubin  2.2 (*)    All other components within normal limits  CBC WITH DIFFERENTIAL/PLATELET - Abnormal; Notable for the following components:   Hemoglobin 17.3 (*)    MCHC 36.8 (*)    All other components within normal limits  LIPASE, BLOOD  URINALYSIS, ROUTINE W REFLEX MICROSCOPIC    Notable for minimal elevation in bilirubin   Imaging Studies ordered: I ordered imaging studies including CT Head On my interpretation imaging demonstrates no acute process I independently visualized and interpreted imaging. I agree with the radiologist interpretation   Medicines ordered and prescription drug management: Meds ordered this encounter  Medications   ketorolac (TORADOL) 15 MG/ML injection 15 mg    -I have reviewed the patients home medicines and have made adjustments as needed  Reevaluation: After the interventions noted above, I reevaluated the patient and found that their symptoms have resolved  Co morbidities that complicate the patient evaluation  Past Medical History:  Diagnosis Date   Hyperlipidemia    Hypertension       Dispostion: Disposition decision including need for hospitalization was considered, and patient discharged from emergency department.    Final Clinical  Impression(s) / ED Diagnoses Final diagnoses:  Nonintractable headache, unspecified chronicity pattern, unspecified headache type     This chart was dictated using voice recognition software.  Despite best efforts to proofread,  errors can occur which can change the documentation meaning.    Cristie Hem, MD 12/29/22 1550

## 2023-01-09 ENCOUNTER — Ambulatory Visit: Payer: Self-pay | Admitting: Physician Assistant

## 2023-01-12 ENCOUNTER — Other Ambulatory Visit: Payer: Self-pay

## 2023-01-12 ENCOUNTER — Ambulatory Visit: Payer: Self-pay | Attending: Physician Assistant | Admitting: Physician Assistant

## 2023-01-12 ENCOUNTER — Encounter: Payer: Self-pay | Admitting: Physician Assistant

## 2023-01-12 VITALS — BP 129/81 | HR 73 | Wt 174.6 lb

## 2023-01-12 DIAGNOSIS — R739 Hyperglycemia, unspecified: Secondary | ICD-10-CM

## 2023-01-12 DIAGNOSIS — Z09 Encounter for follow-up examination after completed treatment for conditions other than malignant neoplasm: Secondary | ICD-10-CM

## 2023-01-12 DIAGNOSIS — E785 Hyperlipidemia, unspecified: Secondary | ICD-10-CM

## 2023-01-12 DIAGNOSIS — I10 Essential (primary) hypertension: Secondary | ICD-10-CM

## 2023-01-12 MED ORDER — AMLODIPINE BESYLATE 10 MG PO TABS
10.0000 mg | ORAL_TABLET | Freq: Every day | ORAL | 1 refills | Status: DC
  Filled 2023-01-12: qty 90, 90d supply, fill #0

## 2023-01-12 MED ORDER — ROSUVASTATIN CALCIUM 20 MG PO TABS
20.0000 mg | ORAL_TABLET | Freq: Every day | ORAL | 1 refills | Status: DC
  Filled 2023-01-12: qty 90, 90d supply, fill #0

## 2023-01-12 NOTE — Progress Notes (Signed)
Patient ID: Shawn Porter, male   DOB: Sep 24, 1971, 51 y.o.   MRN: [id number]     Shawn Porter, is a 52 y.o. male  [id number]  [id number]  DOB - 1971/04/18  No chief complaint on file.      Subjective:   Shawn Porter is a 52 y.o. male here today for a follow up Seen at [facility] ED 12/29/2022 for HA and concerns with blood pressure.  Hasn't been having more HAs.  BP is most of the time good but he is concerned bc occasionally he gets a reading of 140/90.  CT was negative for acute pathology.  No CP/dizziness.  He is compliant with amlodipine.    From ED note: Shawn Porter is a 52 y.o. male with history of hypertension, hyperlipidemia presenting to the emergency department with headache.  Patient reports around 1 month of headache.  He denies any other symptoms such as numbness or tingling, weakness, facial droop, vision changes.  He denies hitting his head.  He also believes that his eyes have turned yellow recently.  He reports generalized fatigue.  No abdominal pain, nausea or vomiting.  No pale stools or diarrhea or melena.  No swelling.  Symptoms mild.  He is also concerned about his blood pressure because it was 0000000 systolic   CT results: FINDINGS: Brain: No evidence of acute infarction, hemorrhage, hydrocephalus, extra-axial collection or mass lesion/mass effect. Vascular: No hyperdense vessel or unexpected calcification. Skull: Normal. Negative for fracture or focal lesion. Sinuses/Orbits: No acute finding. Other: None. IMPRESSION: No acute intracranial pathology.   52 year old male presenting to the emergency department with headaches.   Patient well-appearing, neurologic exam is reassuring.  Patient denies any history of headache, given age will obtain CT scan to evaluate for acute intracranial process or developing intracranial process such as tumor or mass effect.  Doubt subarachnoid hemorrhage given length of symptoms.  Denies risk factors for carbon  monoxide poisoning and no one else at home is headaches.  Patient also concerning that his eyes are turning yellow, on my evaluation may be slightly icteric but hard to say for sure.  Will check labs including CMP to evaluate for jaundice.  He has no abdominal pain to suggest acute intra-abdominal process and no abdominal tenderness especially not in his right upper quadrant to suggest hepatitis or cholecystitis.  Will reassess.  Will treat headaches.  Patient concerned about his blood pressure but only mildly elevated in the emergency department.  No problems updated.  ALLERGIES: Allergies  Allergen Reactions   Other Rash    PAST MEDICAL HISTORY: Past Medical History:  Diagnosis Date   Hyperlipidemia    Hypertension     MEDICATIONS AT HOME: Prior to Admission medications   Medication Sig Start Date End Date Taking? Authorizing Provider  amLODipine (NORVASC) 10 MG tablet Take 1 tablet (10 mg total) by mouth daily. 01/12/23   Argentina Donovan, PA-C  aspirin EC 81 MG tablet Take 81 mg by mouth 3 (three) times a week. Swallow whole.    [provider]  omega-3 acid ethyl esters (LOVAZA) 1 g capsule Take 2 capsules (2 g total) by mouth 2 (two) times daily. Patient not taking: Reported on 12/02/2022 07/01/22 01/01/23  Gildardo Pounds, NP  rosuvastatin (CRESTOR) 20 MG tablet Take 1 tablet (20 mg total) by mouth daily. 01/12/23   Koda Defrank, Dionne Bucy, PA-C    ROS: Neg HEENT Neg resp Neg cardiac Neg GI Neg GU Neg MS Neg psych  Neg neuro  Objective:   Vitals:   01/12/23 1524  BP: 129/81  Pulse: 73  SpO2: 96%  Weight: 174 lb 9.6 oz (79.2 kg)   Exam General appearance : Awake, alert, not in any distress. Speech Clear. Not toxic looking HEENT: Atraumatic and Normocephalic Neck: Supple, no JVD. No cervical lymphadenopathy.  Chest: Good air entry bilaterally, CTAB.  No rales/rhonchi/wheezing CVS: S1 S2 regular, no murmurs.  Extremities: B/L Lower Ext shows no edema, both legs are  warm to touch Neurology: Awake alert, and oriented X 3, CN II-XII intact, Non focal Skin: No Rash  Data Review Lab Results  Component Value Date   HGBA1C 5.2 02/28/2022   HGBA1C 5.1 10/07/2021   HGBA1C 5.1 01/08/2020    Assessment & Plan   1. Primary hypertension controlled - amLODipine (NORVASC) 10 MG tablet; Take 1 tablet (10 mg total) by mouth daily.  Dispense: 90 tablet; Refill: 1  2. High blood sugar I have had a lengthy discussion and provided education about insulin resistance and the intake of too much sugar/refined carbohydrates.  I have advised the patient to work at a goal of eliminating sugary drinks, candy, desserts, sweets, refined sugars, processed foods, and white carbohydrates.  The patient expresses understanding.    3. Dyslipidemia - amLODipine (NORVASC) 10 MG tablet; Take 1 tablet (10 mg total) by mouth daily.  Dispense: 90 tablet; Refill: 1 - rosuvastatin (CRESTOR) 20 MG tablet; Take 1 tablet (20 mg total) by mouth daily.  Dispense: 90 tablet; Refill: 1  4. Encounter for examination following treatment at hospital    Return in about 3 months (around 04/14/2023) for PCP([facility] for chronic conditions).  The patient was given clear instructions to go to ER or return to medical center if symptoms don't improve, worsen or new problems develop. The patient verbalized understanding. The patient was told to call to get lab results if they haven't heard anything in the next week.      Shawn Caldron, PA-C [facility] [place], [place]   01/12/2023, 3:37 PM

## 2023-01-12 NOTE — Patient Instructions (Addendum)
-  sintese quieto y en silencio durante 5 minutos mientras respira lenta y profundamente antes de controlar su presin arterial. Anote las lecturas de presin arterial y trigalas a su prxima visita.   Beba 80-100 onzas de agua al [employer].   Limite el consumo de azcar/carbohidratos refinados para reducir el riesgo de desarrollar diabetes. trabajar con el objetivo de eliminar bebidas azucaradas, dulces, postres, dulces, azcares refinados, alimentos procesados y carbohidratos blancos.

## 2023-02-13 ENCOUNTER — Ambulatory Visit (INDEPENDENT_AMBULATORY_CARE_PROVIDER_SITE_OTHER): Payer: Self-pay

## 2023-02-13 ENCOUNTER — Ambulatory Visit (HOSPITAL_COMMUNITY)
Admission: EM | Admit: 2023-02-13 | Discharge: 2023-02-13 | Disposition: A | Discharge status: Home / Self Care | Payer: Self-pay | Attending: Physician Assistant | Admitting: Physician Assistant

## 2023-02-13 ENCOUNTER — Other Ambulatory Visit: Payer: Self-pay

## 2023-02-13 ENCOUNTER — Encounter (HOSPITAL_COMMUNITY): Payer: Self-pay | Admitting: Emergency Medicine

## 2023-02-13 DIAGNOSIS — M79675 Pain in left toe(s): Secondary | ICD-10-CM

## 2023-02-13 DIAGNOSIS — M79672 Pain in left foot: Secondary | ICD-10-CM

## 2023-02-13 MED ORDER — IBUPROFEN 600 MG PO TABS
600.0000 mg | ORAL_TABLET | Freq: Four times a day (QID) | ORAL | 0 refills | Status: DC | PRN
  Filled 2023-02-13: qty 30, 8d supply, fill #0

## 2023-02-13 NOTE — Discharge Instructions (Addendum)
Advised take ibuprofen 600 mg every 6 hours with food on a regular basis to help decrease pain and swelling. Advised to use good cushioned shoes to help provide padding to the area. Advised to use Epsom salt soaks, 10 minutes, 3-4 times throughout the day to help reduce pain and swelling.  Advised follow-up PCP return to urgent care if symptoms fail to improve.

## 2023-02-13 NOTE — ED Provider Notes (Signed)
[facility]    CSN: [id number] Arrival date & time: 02/13/23  0857      History   Chief Complaint Chief Complaint  Patient presents with   Foot Pain    [name] is a 52 y.o. male.   52 year old male presents with left foot pain.  Patient indicates for the past 3 days he has had a persistent and progressive pain along the left fifth MTP area.  Patient indicates that the pain is worse when he tries to walk or bear weight on the area.  Patient indicates he has not taken any medicine for pain relief.  The pain is localized at the site, without history of trauma to the area.  Patient also indicates that he has not been "bitten by any insects".  Patient indicates that he has not done any unusual activity that would put stress on the area over the past couple weeks.   Foot Pain    Past Medical History:  Diagnosis Date   Hyperlipidemia    Hypertension     Patient Active Problem List   Diagnosis Date Noted   Supraglottitis 11/05/2022   Discoloration of eye 11/23/2016   Palpitations 11/23/2016   Dyslipidemia 11/23/2016   Wound infection after surgery 11/06/2015   Sebaceous cyst 10/19/2015   Health care maintenance 10/01/2015   Right inguinal hernia 10/27/2014   Hypertriglyceridemia 10/27/2014   Pain in joint, shoulder region 10/27/2014   Essential hypertension, benign 08/26/2014   Dental caries 08/26/2014   Radicular pain in right arm 10/16/2013   Right arm pain 07/19/2013    Past Surgical History:  Procedure Laterality Date   HERNIA REPAIR         Home Medications    Prior to Admission medications   Medication Sig Start Date End Date Taking? Authorizing Provider  amLODipine (NORVASC) 10 MG tablet Take 1 tablet (10 mg total) by mouth daily. 01/12/23  Yes Anders Simmonds, PA-C  aspirin EC 81 MG tablet Take 81 mg by mouth 3 (three) times a week. Swallow whole.   Yes [provider]  ibuprofen (ADVIL) 600 MG tablet Take 1 tablet  (600 mg total) by mouth every 6 (six) hours as needed. 02/13/23  Yes Ellsworth Lennox, PA-C  rosuvastatin (CRESTOR) 20 MG tablet Take 1 tablet (20 mg total) by mouth daily. 01/12/23  Yes Georgian Co M, PA-C  omega-3 acid ethyl esters (LOVAZA) 1 g capsule Take 2 capsules (2 g total) by mouth 2 (two) times daily. Patient not taking: Reported on 12/02/2022 07/01/22 01/01/23  Claiborne Rigg, NP    Family History Family History  Problem Relation Age of Onset   Healthy Mother    Heart failure Father    Diabetes Sister     Social History Social History   Tobacco Use   Smoking status: Never   Smokeless tobacco: Never  Vaping Use   Vaping Use: Never used  Substance Use Topics   Alcohol use: Not Currently    Comment: occasionally   Drug use: No     Allergies   Other   Review of Systems Review of Systems  Musculoskeletal:  Positive for gait problem (left foot pain, 5th digit mtp area.).     Physical Exam Triage Vital Signs ED Triage Vitals  Enc Vitals Group     BP 02/13/23 1102 (!) 156/100     Pulse Rate 02/13/23 1102 61     Resp 02/13/23 1102 20     Temp 02/13/23  1102 97.7 F (36.5 C)     Temp Source 02/13/23 1102 Oral     SpO2 02/13/23 1102 98 %     Weight --      Height --      Head Circumference --      Peak Flow --      Pain Score 02/13/23 1101 8     Pain Loc --      Pain Edu? --      Excl. in GC? --    No data found.  Updated Vital Signs BP (!) 156/100 (BP Location: Left Arm) Comment: has not taken blood presssure medicine today  Pulse 61   Temp 97.7 F (36.5 C) (Oral)   Resp 20   SpO2 98%   Visual Acuity Right Eye Distance:   Left Eye Distance:   Bilateral Distance:    Right Eye Near:   Left Eye Near:    Bilateral Near:     Physical Exam Constitutional:      Appearance: Normal appearance.  Musculoskeletal:       Legs:     Comments: Left foot: Pain is palpated at the lateral aspect along the distal MTP and the first phalanx area.  There is no  unusual swelling, redness, and no evidence of any break in the skin or insect bite.  There there is no evidence of a plantars wart being present.  Full range of motion is normal  Neurological:     Mental Status: He is alert.      UC Treatments / Results  Labs (all labs ordered are listed, but only abnormal results are displayed) Labs Reviewed - No data to display  EKG   Radiology DG Toe 5th Left  Result Date: 02/13/2023 CLINICAL DATA:  Left fifth toe pain for the past 3 days.  No injury. EXAM: DG TOE 5TH LEFT COMPARISON:  None Available. FINDINGS: There is no evidence of fracture or dislocation. There is no evidence of arthropathy or other focal bone abnormality. Suspected soft tissue swelling lateral to the fifth metatarsal head. IMPRESSION: 1. Suspected soft tissue swelling lateral to the fifth metatarsal head. No acute osseous abnormality. Electronically Signed   By: Obie Dredge M.D.   On: 02/13/2023 12:20    Procedures Procedures (including critical care time)  Medications Ordered in UC Medications - No data to display  Initial Impression / Assessment and Plan / UC Course  I have reviewed the triage vital signs and the nursing notes.  Pertinent labs & imaging results that were available during my care of the patient were reviewed by me and considered in my medical decision making (see chart for details).    Plan: The diagnosis be treated with the following: 1.  Left foot pain: A.  Advised to use good cushioned shoes. B.  Advised Epsom salt soaks, 10 minutes, 3-4 times throughout the day to help reduce pain and swelling. C.  Advised take ibuprofen 600 mg every 6 hours with food on a regular basis to help decrease pain. 2.  Advised follow-up PCP return to urgent care as needed. Final Clinical Impressions(s) / UC Diagnoses   Final diagnoses:  Foot pain, left     Discharge Instructions      Advised take ibuprofen 600 mg every 6 hours with food on a regular basis  to help decrease pain and swelling. Advised to use good cushioned shoes to help provide padding to the area. Advised to use Epsom salt soaks, 10 minutes, 3-4 times  throughout the day to help reduce pain and swelling.  Advised follow-up PCP return to urgent care if symptoms fail to improve.     ED Prescriptions     Medication Sig Dispense Auth. Provider   ibuprofen (ADVIL) 600 MG tablet Take 1 tablet (600 mg total) by mouth every 6 (six) hours as needed. 30 tablet Ellsworth Lennox, PA-C      PDMP not reviewed this encounter.   Ellsworth Lennox, PA-C 02/13/23 1223

## 2023-02-13 NOTE — ED Triage Notes (Signed)
Left foot pain started 3 days ago.  No known injury.  Patient is not taking any medications

## 2023-03-03 ENCOUNTER — Ambulatory Visit: Payer: Self-pay | Attending: Nurse Practitioner | Admitting: Nurse Practitioner

## 2023-05-19 ENCOUNTER — Ambulatory Visit: Payer: Self-pay

## 2023-05-19 NOTE — Telephone Encounter (Signed)
  Chief Complaint: HA Symptoms: Head at [facility] and top of head Frequency: 2 days Pertinent Negatives: Patient denies Fever, injury Disposition: [] ED /[x] Urgent Care (no appt availability in office) / [] Appointment(In office/virtual)/ []  [place] [facility]/ [] Home Care/ [] Refused Recommended Disposition /[] [place] [facility]/ []  Follow-up with PCP Additional Notes: Pt has had HA for 2 days. Pt states pain is 8/10. Pt states that pain is constant. Pt has not tried any otc medications. Pt has a hx of HTN, but has not missed any medication. Pt will go to [facility] to use automatic BP machine to check bp. Pt states that he will go to UC if needed. Pt did state that HA is a little better than it was, after drinking water.    Summary: Headaches for two days   Headaches for two day...  first available appointment is August 8.  ( Patient will need an interpreter)     Reason for Disposition  [1] MODERATE headache (e.g., interferes with normal activities) AND [2] present > 24 hours AND [3] unexplained  (Exceptions: analgesics not tried, typical migraine, or headache part of viral illness)  Answer Assessment - Initial Assessment Questions 1. LOCATION: "Where does it hurt?"      On temple and up. Not drinking water 2. ONSET: "When did the headache start?" (Minutes, hours or days)       2 days 3. PATTERN: "Does the pain come and go, or has it been constant since it started?"     Constant 4. SEVERITY: "How bad is the pain?" and "What does it keep you from doing?"  (e.g., Scale 1-10; mild, moderate, or severe)   - MILD (1-3): doesn't interfere with normal activities    - MODERATE (4-7): interferes with normal activities or awakens from sleep    - SEVERE (8-10): excruciating pain, unable to do any normal activities        8/10 5. RECURRENT SYMPTOM: "Have you ever had headaches before?" If Yes, ask: "When was the last time?" and "What happened that time?"      no 6. CAUSE: "What do you  think is causing the headache?"     Not drinking enough water 7. MIGRAINE: "Have you been diagnosed with migraine headaches?" If Yes, ask: "Is this headache similar?"      no 8. HEAD INJURY: "Has there been any recent injury to the head?"      no 9. OTHER SYMPTOMS: "Do you have any other symptoms?" (fever, stiff neck, eye pain, sore throat, cold symptoms)     no  Protocols used: Headache-A-AH

## 2023-05-19 NOTE — Telephone Encounter (Signed)
Using [facility] [id number].   Patient called, left VM to return the call to the office to speak to the NT.   Summary: Headaches for two days   Headaches for two day...  first available appointment is August 8.  ( Patient will need an interpreter)

## 2023-05-22 NOTE — Telephone Encounter (Signed)
Note:

## 2023-05-24 NOTE — Telephone Encounter (Signed)
  Chief Complaint: States headache is a little "better." Did not go UC. Has an appointment 06/08/23 Symptoms: Above Frequency:  Pertinent Negatives: Patient denies any other symptoms Disposition: [] ED /[] Urgent Care (no appt availability in office) / [] Appointment(In office/virtual)/ []  [place] [facility]/ [] Home Care/ [] Refused Recommended Disposition /[] [place] [facility]/ [x]  Follow-up with PCP Additional Notes: [facility] agent made appointment. Pt. States he feels better and will call back for worsening of symptoms.

## 2023-06-08 ENCOUNTER — Other Ambulatory Visit: Payer: Self-pay

## 2023-06-08 ENCOUNTER — Encounter: Payer: Self-pay | Admitting: Physician Assistant

## 2023-06-08 ENCOUNTER — Ambulatory Visit: Payer: Self-pay | Attending: Physician Assistant | Admitting: Physician Assistant

## 2023-06-08 VITALS — BP 131/87 | HR 62 | Temp 98.6°F | Resp 12 | Ht 65.0 in | Wt 179.8 lb

## 2023-06-08 DIAGNOSIS — M792 Neuralgia and neuritis, unspecified: Secondary | ICD-10-CM

## 2023-06-08 DIAGNOSIS — R7303 Prediabetes: Secondary | ICD-10-CM

## 2023-06-08 DIAGNOSIS — I10 Essential (primary) hypertension: Secondary | ICD-10-CM

## 2023-06-08 DIAGNOSIS — E782 Mixed hyperlipidemia: Secondary | ICD-10-CM

## 2023-06-08 DIAGNOSIS — R5383 Other fatigue: Secondary | ICD-10-CM

## 2023-06-08 DIAGNOSIS — E785 Hyperlipidemia, unspecified: Secondary | ICD-10-CM

## 2023-06-08 MED ORDER — OMEGA-3-ACID ETHYL ESTERS 1 G PO CAPS
2.0000 g | ORAL_CAPSULE | Freq: Two times a day (BID) | ORAL | 1 refills | Status: DC
  Filled 2023-06-08: qty 360, 90d supply, fill #0

## 2023-06-08 MED ORDER — IBUPROFEN 600 MG PO TABS
600.0000 mg | ORAL_TABLET | Freq: Four times a day (QID) | ORAL | 1 refills | Status: DC | PRN
  Filled 2023-06-08: qty 60, 15d supply, fill #0
  Filled 2023-09-30: qty 60, 15d supply, fill #1

## 2023-06-08 MED ORDER — ROSUVASTATIN CALCIUM 20 MG PO TABS
20.0000 mg | ORAL_TABLET | Freq: Every day | ORAL | 1 refills | Status: DC
  Filled 2023-06-08: qty 90, 90d supply, fill #0

## 2023-06-08 MED ORDER — AMLODIPINE BESYLATE 10 MG PO TABS
10.0000 mg | ORAL_TABLET | Freq: Every day | ORAL | 1 refills | Status: DC
  Filled 2023-06-08: qty 90, 90d supply, fill #0

## 2023-06-08 NOTE — Patient Instructions (Signed)
Drink 80 ounces water daily °

## 2023-06-08 NOTE — Progress Notes (Signed)
Pt is here for fatigue   Complaining of increased fatigue for X1 month ago

## 2023-06-08 NOTE — Progress Notes (Signed)
Patient ID: Shawn Porter, male   DOB: 11/26/1970, 52 y.o.   MRN: [id number]   Shawn Porter, is a 52 y.o. male  [id number]  HQI:[id number]  DOB - 1971/05/19  Chief Complaint  Patient presents with   Fatigue       Subjective:   Shawn Porter is a 52 y.o. male here today formed Rf and complaints of fatigue for 1 month.  He speaks and understands and does not require an interpreter. Sleep is normal.  No new or different activities.  Does not drink much water.  Diet is not super healthy. No constitutional s/sx.    No problems updated.  ALLERGIES: Allergies  Allergen Reactions   Other Rash    PAST MEDICAL HISTORY: Past Medical History:  Diagnosis Date   Hyperlipidemia    Hypertension     MEDICATIONS AT HOME: Prior to Admission medications   Medication Sig Start Date End Date Taking? Authorizing Provider  aspirin EC 81 MG tablet Take 81 mg by mouth 3 (three) times a week. Swallow whole.   Yes [provider]  amLODipine (NORVASC) 10 MG tablet Take 1 tablet (10 mg total) by mouth daily. 06/08/23   Shawn Simmonds, PA-C  ibuprofen (ADVIL) 600 MG tablet Take 1 tablet (600 mg total) by mouth every 6 (six) hours as needed. 06/08/23   Shawn Simmonds, PA-C  omega-3 acid ethyl esters (LOVAZA) 1 g capsule Take 2 capsules (2 g total) by mouth 2 (two) times daily. 06/08/23 12/05/23  Shawn Simmonds, PA-C  rosuvastatin (CRESTOR) 20 MG tablet Take 1 tablet (20 mg total) by mouth daily. 06/08/23   , Shawn Schlein, PA-C    ROS: Neg HEENT Neg resp Neg cardiac Neg GI Neg GU Neg MS Neg psych Neg neuro  Objective:   Vitals:   06/08/23 0954  BP: 131/87  Pulse: 62  Resp: 12  Temp: 98.6 F (37 C)  SpO2: 99%  Weight: 179 lb 12.8 oz (81.6 kg)  Height: 5\' 5"  (1.651 m)   Exam General appearance : Awake, alert, not in any distress. Speech Clear. Not toxic looking HEENT: Atraumatic and Normocephalic Neck: Supple, no JVD. No cervical lymphadenopathy. No  thyromegaly Chest: Good air entry bilaterally, CTAB.  No rales/rhonchi/wheezing CVS: S1 S2 regular, no murmurs.  Extremities: B/L Lower Ext shows no edema, both legs are warm to touch Neurology: Awake alert, and oriented X 3, CN II-XII intact, Non focal Skin: No Rash  Data Review Lab Results  Component Value Date   HGBA1C 5.2 02/28/2022   HGBA1C 5.1 10/07/2021   HGBA1C 5.1 01/08/2020    Assessment & Plan   1. Prediabetes I have had a lengthy discussion and provided education about insulin resistance and the intake of too much sugar/refined carbohydrates.  I have advised the patient to work at a goal of eliminating sugary drinks, candy, desserts, sweets, refined sugars, processed foods, and white carbohydrates.  The patient expresses understanding.  - Hemoglobin A1c  2. Primary hypertension Suboptimal control. DASH diet, weight loss, exercise recommended - amLODipine (NORVASC) 10 MG tablet; Take 1 tablet (10 mg total) by mouth daily.  Dispense: 90 tablet; Refill: 1  3. Dyslipidemia - rosuvastatin (CRESTOR) 20 MG tablet; Take 1 tablet (20 mg total) by mouth daily.  Dispense: 90 tablet; Refill: 1  4. Mixed hypercholesterolemia and hypertriglyceridemia - omega-3 acid ethyl esters (LOVAZA) 1 g capsule; Take 2 capsules (2 g total) by mouth 2 (two) times daily.  Dispense: 360 capsule; Refill:  1  5. Other fatigue - TSH  6. Radicular pain in right arm Stable and uses ibu prn - ibuprofen (ADVIL) 600 MG tablet; Take 1 tablet (600 mg total) by mouth every 6 (six) hours as needed.  Dispense: 60 tablet; Refill: 1    Return in about 6 months (around 12/09/2023) for PCP/Zelda for chronic conditions.  The patient was given clear instructions to go to ER or return to medical center if symptoms don't improve, worsen or new problems develop. The patient verbalized understanding. The patient was told to call to get lab results if they haven't heard anything in the next week.      Shawn Co, PA-C [facility] and [facility] [place], [place] [phone]   06/08/2023, 10:25 AM

## 2023-06-14 ENCOUNTER — Telehealth: Payer: Self-pay

## 2023-06-14 NOTE — Telephone Encounter (Signed)
-----   Message from Georgian Co sent at 06/09/2023 11:42 AM EDT ----- Thyroid is normal and you do not have diabetes.  Thanks, Georgian Co, PA-C

## 2023-06-14 NOTE — Telephone Encounter (Signed)
Pt was called and is aware of results, DOB was confirmed.  Interpreter id # [phone]

## 2023-09-30 ENCOUNTER — Other Ambulatory Visit: Payer: Self-pay

## 2023-10-03 ENCOUNTER — Other Ambulatory Visit: Payer: Self-pay

## 2023-11-10 ENCOUNTER — Ambulatory Visit: Payer: Self-pay | Admitting: *Deleted

## 2023-11-10 NOTE — Telephone Encounter (Signed)
  Chief Complaint: numbness right hand thumb and index finger per pt son not with patient now.  Symptoms: constant numbness right hand thumb and index finger, left arm at times. Wife reports pt c/o blurred vision . Takes OTC tylenol / ibuprofen . Has not missed any BP medications and unsure of blood pressure.  Frequency: 2-3 weeks Pertinent Negatives: Patient denies chest pain no difficulty breathing no weakness on one side of body no numbness in face, no dizziness. Disposition: [] ED /[] Urgent Care (no appt availability in office) / [x] Appointment(In office/virtual)/ []  [place] [facility]/ [] Home Care/ [] Refused Recommended Disposition /[] [place] [facility]/ []  Follow-up with PCP Additional Notes:    Recommended patient check BP when he gets home, call back if 160 /100 or greater or any sx headache dizziness blurred vision continues. If sx worsen over weekend go to ED. Patient son verbalized understanding .  Appt scheduled for 11/30/23. Please advise if earlier appt available .  Please advise.       Reason for Disposition  [1] Numbness or tingling in one or both hands AND [2] is a chronic symptom (recurrent or ongoing AND present > 4 weeks)  Answer Assessment - Initial Assessment Questions 1. SYMPTOM: What is the main symptom you are concerned about? (e.g., weakness, numbness)     Numbness thumb and index finger per patient's son not with patient now.  2. ONSET: When did this start? (minutes, hours, days; while sleeping)     2-3 weeks  3. LAST NORMAL: When was the last time you (the patient) were normal (no symptoms)?     2 weeks ago  4. PATTERN Does this come and go, or has it been constant since it started?  Is it present now?     Constant  5. CARDIAC SYMPTOMS: Have you had any of the following symptoms: chest pain, difficulty breathing, palpitations?     No  6. NEUROLOGIC SYMPTOMS: Have you had any of the following symptoms: headache, dizziness, vision loss, double  vision, changes in speech, unsteady on your feet?     Blurred vision, shoulder pain , numbness left arm and right hand thumb and index finger 7. OTHER SYMPTOMS: Do you have any other symptoms?     Numbness right thumb and index finger 8. PREGNANCY: Is there any chance you are pregnant? When was your last menstrual period?     na  Protocols used: Neurologic Deficit-A-AH

## 2023-11-13 NOTE — Telephone Encounter (Signed)
 Noted no earlier appointments available at this time.

## 2023-11-30 ENCOUNTER — Encounter: Payer: Self-pay | Admitting: Physician Assistant

## 2023-11-30 ENCOUNTER — Other Ambulatory Visit: Payer: Self-pay

## 2023-11-30 ENCOUNTER — Ambulatory Visit: Payer: Self-pay | Attending: Physician Assistant | Admitting: Physician Assistant

## 2023-11-30 VITALS — BP 158/78 | HR 64 | Wt 181.2 lb

## 2023-11-30 DIAGNOSIS — G5601 Carpal tunnel syndrome, right upper limb: Secondary | ICD-10-CM

## 2023-11-30 MED ORDER — MELOXICAM 15 MG PO TABS
15.0000 mg | ORAL_TABLET | Freq: Every day | ORAL | 1 refills | Status: DC
  Filled 2023-11-30: qty 30, 30d supply, fill #0

## 2023-11-30 NOTE — Progress Notes (Signed)
Patient ID: Shawn Porter, male   DOB: 02-11-1971, 53 y.o.   MRN: [id number]   Shawn Porter, is a 53 y.o. male  WUJ:[id number]  [id number]:[id number]  DOB - 1971-06-29  Chief Complaint  Patient presents with   Hand Pain    1       Subjective:   [name] is a 53 y.o. male here today for R wrist pain and numbness esp at night.  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  This pain has been intermittent but has become more consistent recently.  He is R hand dominant.  NKI.    No problems updated.  ALLERGIES: Allergies  Allergen Reactions   Other Rash    PAST MEDICAL HISTORY: Past Medical History:  Diagnosis Date   Hyperlipidemia    Hypertension     MEDICATIONS AT HOME: Prior to Admission medications   Medication Sig Start Date End Date Taking? Authorizing Provider  amLODipine (NORVASC) 10 MG tablet Take 1 tablet (10 mg total) by mouth daily. 06/08/23  Yes Florine Sprenkle, Marzella Schlein, PA-C  meloxicam (MOBIC) 15 MG tablet Take 1 tablet (15 mg total) by mouth daily. Prn pain 11/30/23  Yes Anders Simmonds, PA-C  rosuvastatin (CRESTOR) 20 MG tablet Take 1 tablet (20 mg total) by mouth daily. 06/08/23  Yes Anders Simmonds, PA-C  aspirin EC 81 MG tablet Take 81 mg by mouth 3 (three) times a week. Swallow whole. Patient not taking: Reported on 11/30/2023    [provider]  omega-3 acid ethyl esters (LOVAZA) 1 g capsule Take 2 capsules (2 g total) by mouth 2 (two) times daily. Patient not taking: Reported on 11/30/2023 06/08/23 12/05/23  Anders Simmonds, PA-C    ROS: Neg HEENT Neg resp Neg cardiac Neg GI Neg GU Neg psych Neg neuro  Objective:   Vitals:   11/30/23 0938 11/30/23 0940  BP:  (!) 158/78  Pulse:  64  SpO2:  96%  Weight: 181 lb 3.2 oz (82.2 kg)    Exam General appearance : Awake, alert, not in any distress. Speech Clear. Not toxic looking HEENT: Atraumatic and Normocephalic B hands and wrists examined.  +phalen's neg tinel's.   Normal grip Neurology: Awake alert, and oriented X 3, CN II-XII intact, Non focal Skin: No Rash  Data Review Lab Results  Component Value Date   HGBA1C 5.2 06/08/2023   HGBA1C 5.2 02/28/2022   HGBA1C 5.1 10/07/2021    Assessment & Plan   1. 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 Prn pain  Dispense: 30 tablet; Refill: 1 - Ambulatory referral to Hand Surgery   Return for appt with Zelda/PCP in february.  The patient was given clear instructions to go to ER or return to medical center if symptoms don't improve, worsen or new problems develop. The patient verbalized understanding. The patient was told to call to get lab results if they haven't heard anything in the next week.      Georgian Co, PA-C [facility] and [facility] [place], [place] [phone]   11/30/2023, 10:09 AM

## 2023-11-30 NOTE — Patient Instructions (Signed)
Pinzamiento del nervio de la mueca (sndrome del tnel carpiano): Qu debe saber Pinched Nerve in the Wrist (Carpal Tunnel Syndrome): What to Know  El pinzamiento del nervio de la [place] (sndrome del tnel carpiano o STC) es un problema nervioso que Passenger transport manager, entumecimiento y debilidad en la [place], la mano y los dedos. El tnel carpiano es un espacio estrecho que se encuentra en el lado palmar de la [place]. Los movimientos repetitivos de la mueca o determinadas enfermedades pueden causar hinchazn en el tnel. Esta hinchazn puede comprimir el nervio principal de la mueca (el nervio mediano). Cules son las causas? Las causas del STC pueden ser las siguientes: Mover la mano y la [place] y otra vez mientras realiza una tarea. Lastimarse la [place]. Artritis. Una bolsa llena de lquido (quiste) o un crecimiento (tumor) en el tnel carpiano. Acumulacin de lquido Academic librarian. Uso de herramientas que vibran. En algunos casos, se desconoce la causa del STC. Qu incrementa el riesgo? Es ms probable que tenga el STC si: Tiene un trabajo en el que deba hacer estas cosas: Mover la mano con firmeza una y [place]. Trabajar con herramientas que vibran, como taladros o lijadoras. Es mujer. Tiene diabetes, obesidad, problemas de tiroides o insuficiencia renal. Cules son los signos o sntomas? Los sntomas de esta afeccin incluyen: Sensacin de hormigueo en los dedos. Puede sentir [employer], el dedo ndice o el dedo corazn. Hormigueo o prdida de la sensibilidad de Engineer, site. Dolor en todo el brazo. Este dolor puede empeorar al flexionar la [place] y el codo durante [place]. Dolor en la mueca que sube por el brazo hasta el hombro. Dolor que baja hasta la palma de la mano o los dedos. Debilidad en las manos. Puede resultarle difcil tomar y Personal assistant. Los sntomas pueden empeorar durante la noche. Cmo se diagnostica? El STC se diagnostica mediante  la historia clnica y un examen fsico. Tambin pueden hacerle pruebas y estudios de diagnstico por imgenes para: Revisar las Catering manager que los nervios les envan a los msculos. Determinar si las seales elctricas pasan correctamente por los nervios. Determinar las posibles causas del STC. Estos incluyen radiografas, ecografa y Health visitor (RM). Cmo se trata? El STC puede tratarse de las siguientes maneras: Cambios en el estilo de vida. Se le pedir que deje o cambie la actividad que caus el problema. Fisioterapia. Puede incluir: Ejercicios que estiran y [employer] y tendones de la mueca y la [place]. Ejercicios de deslizamiento o movilizacin de los nervios. Estos ayudan a que los nervios se muevan fluidamente dentro de los tejidos [employer] rodean. Terapia ocupacional. Aprender a usar la mano nuevamente. Medicamentos para Chief Technology Officer y la hinchazn. Es posible que le apliquen inyecciones en la [place]. Una frula o un dispositivo ortopdico para la [place]. [place]. Siga estas indicaciones en su casa: Si tiene una frula o un dispositivo ortopdico: Use la frula o el dispositivo ortopdico como se lo hayan indicado. Quteselos solo si el mdico lo autoriza. Controle [employer] piel a su alrededor. Informe al mdico si observa problemas. Afloje la frula o el dispositivo ortopdico si los dedos se le entumecen, siente hormigueo o se le enfran y se tornan de Research officer, trade union. Mantenga la frula o el dispositivo ortopdico limpios y secos. Si la frula o el dispositivo ortopdico no son impermeables: No deje que se mojen. Cbralos para ducharse o baarse. Use una cubierta que no permita que [place]. Control  del dolor, la rigidez y la hinchazn  Use hielo o una bolsa de hielo como se lo hayan indicado. Si tiene una frula o un dispositivo ortopdico que se pueden Development worker, community, quteselos como se lo hayan indicado. Coloque una toalla entre la piel y el  hielo. Coloque el hielo durante 20 minutos, 2 a 3 veces por da. Si la piel se le pone de color rojo, quite el hielo de inmediato para evitar daos en la piel. El [place] de dao es mayor si no puede sentir dolor, Airline pilot o fro. Mueva los dedos de la mano con frecuencia para reducir la rigidez y la hinchazn. Indicaciones generales Use los medicamentos nicamente segn las indicaciones. Descanse la [place] y la mano de cualquier actividad que le cause dolor. Si la causa del STC es algo que hace en el [place], hable con su empleador sobre hacer cambios. Por ejemplo, es posible que necesite usar una almohadilla para la mueca mientras escribe en el teclado. Haga ejercicio segn las indicaciones. Siga las instrucciones para hacer los ejercicios de deslizamiento o movilizacin de los nervios. Estos ayudan a que los nervios se muevan fluidamente dentro de los tejidos [employer] rodean. Concurra a todas las visitas de seguimiento. Esto es importante. Dnde obtener ms informacin [place] [employer] of Orthopedic Surgeons (Academia Estadounidense de Cirujanos Ortopdicos): orthoinfo.aaos.[employer] of Neurological Disorders and Stroke (Instituto Nacional de Trastornos Neurolgicos y Accidentes Cerebrovasculares): BasicFM.no Comunquese con un mdico si: Aparecen nuevos sntomas. El dolor no se alivia con los [employer]. Los sntomas empeoran. Solicite ayuda de inmediato si: La mano o la mueca se le adormecen o siente hormigueos, y los sntomas se hacen muy intensos. Esta informacin no tiene Theme park manager el consejo del mdico. Asegrese de hacerle al mdico cualquier pregunta que tenga. Document Revised: 07/04/2023 Document Reviewed: 07/04/2023 Elsevier Patient Education  2024 [employer].

## 2023-12-11 NOTE — Progress Notes (Signed)
Office Visit Note   Patient: Shawn Porter           Date of Birth: 06/25/1971           MRN: [id number] Visit Date: 12/12/2023              Requested by: Anders Simmonds, PA-C [address] [place],  [place] [address] PCP: Claiborne Rigg, NP   Assessment & Plan: Visit Diagnoses:  1. Right carpal tunnel syndrome     Plan: Patient is a 53 year old gentleman with right hand numbness possible carpal tunnel syndrome.  Will provide a carpal tunnel splint to wear at night.  Will order EMGs to further.  Follow-up after the EMGs.  Follow-Up Instructions: No follow-ups on file.   Orders:  No orders of the defined types were placed in this encounter.  No orders of the defined types were placed in this encounter.     Procedures: No procedures performed   Clinical Data: No additional findings.   Subjective: Chief Complaint  Patient presents with   Right Wrist - Pain    HPI Patient is a 53 year old gentleman here for evaluation of pain and numbness to the right hand for about a month.  Cannot sleep at night due to the symptoms.  Right-hand-dominant.  Meloxicam prescribed by PCP does not help.  Review of Systems  Constitutional: Negative.   HENT: Negative.    Eyes: Negative.   Respiratory: Negative.    Cardiovascular: Negative.   Gastrointestinal: Negative.   Endocrine: Negative.   Genitourinary: Negative.   Skin: Negative.   Allergic/Immunologic: Negative.   Neurological: Negative.   Hematological: Negative.   Psychiatric/Behavioral: Negative.    All other systems reviewed and are negative.    Objective: Vital Signs: There were no vitals taken for this visit.  Physical Exam Vitals and nursing note reviewed.  Constitutional:      Appearance: He is well-developed.  HENT:     Head: Normocephalic and atraumatic.  Eyes:     Pupils: Pupils are equal, round, and reactive to light.  Pulmonary:     Effort: Pulmonary effort is normal.  Abdominal:      Palpations: Abdomen is soft.  Musculoskeletal:        General: Normal range of motion.     Cervical back: Neck supple.  Skin:    General: Skin is warm.  Neurological:     Mental Status: He is alert and oriented to person, place, and time.  Psychiatric:        Behavior: Behavior normal.        Thought Content: Thought content normal.        Judgment: Judgment normal.     Ortho Exam Examination of the right hand shows no muscle atrophy.  Full composite fist.  Negative carpal tunnel compressive signs. Specialty Comments:  No specialty comments available.  Imaging: No results found.   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/2022   Discoloration of eye 11/23/2016   Palpitations 11/23/2016   Dyslipidemia 11/23/2016   Wound infection after surgery 11/06/2015   Sebaceous cyst 10/19/2015   Health care maintenance 10/01/2015   Right inguinal hernia 10/27/2014   Hypertriglyceridemia 10/27/2014   Pain in joint, shoulder region 10/27/2014   Essential hypertension, benign 08/26/2014   Dental caries 08/26/2014   Radicular pain in right arm 10/16/2013   Right arm pain 07/19/2013   Past Medical History:  Diagnosis Date  Hyperlipidemia    Hypertension     Family History  Problem Relation Age of Onset   Healthy Mother    Heart failure Father    Diabetes Sister     Past Surgical History:  Procedure Laterality Date   HERNIA REPAIR     Social History   Occupational History   Not on file  Tobacco Use   Smoking status: Never   Smokeless tobacco: Never  Vaping Use   Vaping status: Never Used  Substance and Sexual Activity   Alcohol use: Not Currently    Comment: occasionally   Drug use: No   Sexual activity: Not on file

## 2023-12-12 ENCOUNTER — Ambulatory Visit: Payer: Self-pay | Attending: Nurse Practitioner | Admitting: Nurse Practitioner

## 2023-12-12 ENCOUNTER — Ambulatory Visit (INDEPENDENT_AMBULATORY_CARE_PROVIDER_SITE_OTHER): Payer: Self-pay | Admitting: Orthopaedic Surgery

## 2023-12-12 ENCOUNTER — Other Ambulatory Visit: Payer: Self-pay

## 2023-12-12 ENCOUNTER — Encounter: Payer: Self-pay | Admitting: Nurse Practitioner

## 2023-12-12 VITALS — BP 122/72 | HR 60 | Ht 65.0 in | Wt 180.4 lb

## 2023-12-12 DIAGNOSIS — G5601 Carpal tunnel syndrome, right upper limb: Secondary | ICD-10-CM

## 2023-12-12 DIAGNOSIS — I10 Essential (primary) hypertension: Secondary | ICD-10-CM

## 2023-12-12 DIAGNOSIS — E782 Mixed hyperlipidemia: Secondary | ICD-10-CM

## 2023-12-12 DIAGNOSIS — D582 Other hemoglobinopathies: Secondary | ICD-10-CM

## 2023-12-12 MED ORDER — GABAPENTIN 300 MG PO CAPS
300.0000 mg | ORAL_CAPSULE | Freq: Three times a day (TID) | ORAL | 3 refills | Status: DC
  Filled 2023-12-12: qty 90, 30d supply, fill #0
  Filled 2024-01-09: qty 90, 30d supply, fill #1

## 2023-12-12 MED ORDER — ROSUVASTATIN CALCIUM 20 MG PO TABS
20.0000 mg | ORAL_TABLET | Freq: Every day | ORAL | 1 refills | Status: DC
  Filled 2023-12-12: qty 90, 90d supply, fill #0

## 2023-12-12 MED ORDER — OMEGA-3-ACID ETHYL ESTERS 1 G PO CAPS
2.0000 g | ORAL_CAPSULE | Freq: Two times a day (BID) | ORAL | 1 refills | Status: DC
  Filled 2023-12-12: qty 360, 90d supply, fill #0

## 2023-12-12 MED ORDER — AMLODIPINE BESYLATE 10 MG PO TABS
10.0000 mg | ORAL_TABLET | Freq: Every day | ORAL | 1 refills | Status: DC
  Filled 2023-12-12: qty 90, 90d supply, fill #0
  Filled 2024-03-14 – 2024-07-15 (×3): qty 90, 90d supply, fill #1

## 2023-12-12 NOTE — Progress Notes (Signed)
Assessment & Plan:  [name] was seen today for hypertension.  Diagnoses and all orders for this visit:  Primary hypertension -     CMP14+EGFR -     amLODipine (NORVASC) 10 MG tablet; Take 1 tablet (10 mg total) by mouth daily. Continue all antihypertensives as prescribed.  Reminded to bring in blood pressure log for follow  up appointment.  RECOMMENDATIONS: DASH/Mediterranean Diets are healthier choices for HTN.    Carpal tunnel syndrome of right wrist -     gabapentin (NEURONTIN) 300 MG capsule; Take 1 capsule (300 mg total) by mouth 3 (three) times daily.  Elevated hemoglobin (HCC) -     CBC with Differential  Mixed hypercholesterolemia and hypertriglyceridemia -     rosuvastatin (CRESTOR) 20 MG tablet; Take 1 tablet (20 mg total) by mouth daily. -     omega-3 acid ethyl esters (LOVAZA) 1 g capsule; Take 2 capsules (2 g total) by mouth 2 (two) times daily. INSTRUCTIONS: Work on a low fat, heart healthy diet and participate in regular aerobic exercise program by working out at least 150 minutes per week; 5 days a week-30 minutes per day. Avoid red meat/beef/steak,  fried foods. junk foods, sodas, sugary drinks, unhealthy snacking, alcohol and smoking.  Drink at least 80 oz of water per day and monitor your carbohydrate intake daily.      Patient has been counseled on age-appropriate routine health concerns for screening and prevention. These are reviewed and up-to-date. Referrals have been placed accordingly. Immunizations are up-to-date or declined.    Subjective:   Chief Complaint  Patient presents with   Hypertension    Shawn Porter 53 y.o. male presents to office today for follow up to HTN  VRI was used to communicate directly with patient for the entire encounter including providing detailed patient instructions.     HTN Blood pressure is well controlled. He is taking amlodipine 10 mg daily as prescribed.   BP Readings from Last 3 Encounters:  12/12/23 122/72   11/30/23 (!) 158/78  06/08/23 131/87    He is currently being followed by ortho for carpal tunnel symptoms. He states the meloxicam does not provide relief of his right hand pain. He is wearing his right wrist splint today in office   Lab Results  Component Value Date   CHOL 219 (H) 02/28/2022   CHOL 198 03/31/2021   CHOL 169 01/08/2020   Lab Results  Component Value Date   HDL 40 02/28/2022   HDL 43 03/31/2021   HDL 42 01/08/2020   Lab Results  Component Value Date   LDLCALC 126 (H) 02/28/2022   LDLCALC 105 (H) 03/31/2021   LDLCALC 105 (H) 01/08/2020   Lab Results  Component Value Date   TRIG 301 (H) 02/28/2022   TRIG 293 (H) 03/31/2021   TRIG 120 01/08/2020   Lab Results  Component Value Date   CHOLHDL 5.5 (H) 02/28/2022   CHOLHDL 4.6 03/31/2021   CHOLHDL 4.0 01/08/2020   No results found for: "LDLDIRECT"  Review of Systems  Constitutional:  Negative for fever, malaise/fatigue and weight loss.  HENT: Negative.  Negative for nosebleeds.   Eyes: Negative.  Negative for blurred vision, double vision and photophobia.  Respiratory: Negative.  Negative for cough and shortness of breath.   Cardiovascular: Negative.  Negative for chest pain, palpitations and leg swelling.  Gastrointestinal: Negative.  Negative for heartburn, nausea and vomiting.  Musculoskeletal:  Positive for joint pain. Negative for myalgias.  Neurological: Negative.  Negative for dizziness, focal weakness, seizures and headaches.  Psychiatric/Behavioral: Negative.  Negative for suicidal ideas.     Past Medical History:  Diagnosis Date   Hyperlipidemia    Hypertension     Past Surgical History:  Procedure Laterality Date   HERNIA REPAIR      Family History  Problem Relation Age of Onset   Healthy Mother    Heart failure Father    Diabetes Sister     Social History Reviewed with no changes to be made today.   Outpatient Medications Prior to Visit  Medication Sig Dispense Refill    aspirin EC 81 MG tablet Take 81 mg by mouth 3 (three) times a week. Swallow whole.     meloxicam (MOBIC) 15 MG tablet Take 1 tablet (15 mg total) by mouth daily. Prn pain 30 tablet 1   amLODipine (NORVASC) 10 MG tablet Take 1 tablet (10 mg total) by mouth daily. 90 tablet 1   rosuvastatin (CRESTOR) 20 MG tablet Take 1 tablet (20 mg total) by mouth daily. 90 tablet 1   omega-3 acid ethyl esters (LOVAZA) 1 g capsule Take 2 capsules (2 g total) by mouth 2 (two) times daily. (Patient not taking: Reported on 11/30/2023) 360 capsule 1   No facility-administered medications prior to visit.    Allergies  Allergen Reactions   Other Rash       Objective:    BP 122/72 (BP Location: Left Arm, Patient Position: Sitting, Cuff Size: Normal)   Pulse 60   Ht 5\' 5"  (1.651 m)   Wt 180 lb 6.4 oz (81.8 kg)   SpO2 99%   BMI 30.02 kg/m  Wt Readings from Last 3 Encounters:  12/12/23 180 lb 6.4 oz (81.8 kg)  11/30/23 181 lb 3.2 oz (82.2 kg)  06/08/23 179 lb 12.8 oz (81.6 kg)    Physical Exam Vitals and nursing note reviewed.  Constitutional:      Appearance: He is well-developed.  HENT:     Head: Normocephalic and atraumatic.  Cardiovascular:     Rate and Rhythm: Normal rate and regular rhythm.     Heart sounds: Normal heart sounds. No murmur heard.    No friction rub. No gallop.  Pulmonary:     Effort: Pulmonary effort is normal. No tachypnea or respiratory distress.     Breath sounds: Normal breath sounds. No decreased breath sounds, wheezing, rhonchi or rales.  Chest:     Chest wall: No tenderness.  Abdominal:     General: Bowel sounds are normal.     Palpations: Abdomen is soft.  Musculoskeletal:        General: Normal range of motion.     Cervical back: Normal range of motion.  Skin:    General: Skin is warm and dry.  Neurological:     Mental Status: He is alert and oriented to person, place, and time.     Coordination: Coordination normal.  Psychiatric:        Behavior: Behavior  normal. Behavior is cooperative.        Thought Content: Thought content normal.        Judgment: Judgment normal.          Patient has been counseled extensively about nutrition and exercise as well as the importance of adherence with medications and regular follow-up. The patient was given clear instructions to go to ER or return to medical center if symptoms don't improve, worsen or new problems develop. The patient verbalized understanding.  Follow-up: Return in about 3 months (around 03/10/2024).   Claiborne Rigg, FNP-BC [facility] [place], [place] [phone]   12/12/2023, 10:25 AM

## 2023-12-13 LAB — CMP14+EGFR
ALT: 82 [IU]/L — ABNORMAL HIGH (ref 0–44)
AST: 38 [IU]/L (ref 0–40)
Albumin: 4.7 g/dL (ref 3.8–4.9)
Alkaline Phosphatase: 85 [IU]/L (ref 44–121)
BUN/Creatinine Ratio: 21 — ABNORMAL HIGH (ref 9–20)
BUN: 16 mg/dL (ref 6–24)
Bilirubin Total: 1.6 mg/dL — ABNORMAL HIGH (ref 0.0–1.2)
CO2: 22 mmol/L (ref 20–29)
Calcium: 9.9 mg/dL (ref 8.7–10.2)
Chloride: 104 mmol/L (ref 96–106)
Creatinine, Ser: 0.75 mg/dL — ABNORMAL LOW (ref 0.76–1.27)
Globulin, Total: 1.8 g/dL (ref 1.5–4.5)
Glucose: 103 mg/dL — ABNORMAL HIGH (ref 70–99)
Potassium: 4.6 mmol/L (ref 3.5–5.2)
Sodium: 142 mmol/L (ref 134–144)
Total Protein: 6.5 g/dL (ref 6.0–8.5)
eGFR: 108 mL/min/{1.73_m2} (ref >59)

## 2023-12-13 LAB — CBC WITH DIFFERENTIAL/PLATELET
Basophils Absolute: 0.1 10*3/uL (ref 0.0–0.2)
Basos: 1 %
EOS (ABSOLUTE): 0.2 10*3/uL (ref 0.0–0.4)
Eos: 4 %
Hematocrit: 51.1 % — ABNORMAL HIGH (ref 37.5–51.0)
Hemoglobin: 18 g/dL — ABNORMAL HIGH (ref 13.0–17.7)
Immature Grans (Abs): 0 10*3/uL (ref 0.0–0.1)
Immature Granulocytes: 0 %
Lymphocytes Absolute: 2.3 10*3/uL (ref 0.7–3.1)
Lymphs: 48 %
MCH: 33.5 pg — ABNORMAL HIGH (ref 26.6–33.0)
MCHC: 35.2 g/dL (ref 31.5–35.7)
MCV: 95 fL (ref 79–97)
Monocytes Absolute: 0.4 10*3/uL (ref 0.1–0.9)
Monocytes: 8 %
Neutrophils Absolute: 1.9 10*3/uL (ref 1.4–7.0)
Neutrophils: 39 %
Platelets: 203 10*3/uL (ref 150–450)
RBC: 5.37 x10E6/uL (ref 4.14–5.80)
RDW: 12.5 % (ref 11.6–15.4)
WBC: 4.9 10*3/uL (ref 3.4–10.8)

## 2023-12-17 ENCOUNTER — Encounter: Payer: Self-pay | Admitting: Nurse Practitioner

## 2023-12-18 ENCOUNTER — Other Ambulatory Visit: Payer: Self-pay

## 2024-01-02 ENCOUNTER — Ambulatory Visit (INDEPENDENT_AMBULATORY_CARE_PROVIDER_SITE_OTHER): Payer: Self-pay | Admitting: Physical Medicine and Rehabilitation

## 2024-01-02 DIAGNOSIS — R531 Weakness: Secondary | ICD-10-CM

## 2024-01-02 DIAGNOSIS — M25531 Pain in right wrist: Secondary | ICD-10-CM

## 2024-01-02 DIAGNOSIS — R202 Paresthesia of skin: Secondary | ICD-10-CM

## 2024-01-02 DIAGNOSIS — M79601 Pain in right arm: Secondary | ICD-10-CM

## 2024-01-02 NOTE — Progress Notes (Unsigned)
 Pain Score---8 Patient advises he is right hand dominate Patient advised he has tingling and numbness, without any weakness.

## 2024-01-02 NOTE — Progress Notes (Unsigned)
   Shawn Porter - 53 y.o. male MRN [id number]  Date of birth: [birth year]  Office Visit Note: Visit Date: 01/02/2024 PCP: Claiborne Rigg, NP Referred by: Tarry Kos, MD  Subjective: Chief Complaint  Patient presents with   Right Hand - Numbness, Pain   HPI: Shawn Porter is a 53 y.o. male who comes in today at the request of Dr. Glee Arvin for evaluation and management of chronic, worsening and severe pain, numbness and tingling in the Right upper extremities.  Patient is Right hand dominant.  pain and numbness to the right hand for about a month.  Cannot sleep at night due to the symptoms.  Right-hand-dominant.  Meloxicam prescribed by PCP does not help.       ROS Otherwise per HPI.  Assessment & Plan: Visit Diagnoses:    ICD-10-CM   1. Paresthesia of skin  R20.2 NCV with EMG (electromyography)       Plan: No additional findings.   Meds & Orders: No orders of the defined types were placed in this encounter.   Orders Placed This Encounter  Procedures   NCV with EMG (electromyography)    Follow-up: No follow-ups on file.   Procedures: No procedures performed      Clinical History: No specialty comments available.   He reports that he has never smoked. He has never used smokeless tobacco.  Recent Labs    06/08/23 1110  HGBA1C 5.2    Objective:  VS:  HT:    WT:   BMI:     BP:   HR: bpm  TEMP: ( )  RESP:  Physical Exam  Ortho Exam  Imaging: No results found.  Past Medical/Family/Surgical/Social History: Medications & Allergies reviewed per EMR, new medications updated. Patient Active Problem List   Diagnosis Date Noted   Supraglottitis 11/05/2022   Discoloration of eye 11/23/2016   Palpitations 11/23/2016   Dyslipidemia 11/23/2016   Wound infection after surgery 11/06/2015   Sebaceous cyst 10/19/2015   Health care maintenance 10/01/2015   Right inguinal hernia 10/27/2014   Hypertriglyceridemia 10/27/2014   Pain in joint, shoulder  region 10/27/2014   Essential hypertension, benign 08/26/2014   Dental caries 08/26/2014   Radicular pain in right arm 10/16/2013   Right arm pain 07/19/2013   Past Medical History:  Diagnosis Date   Hyperlipidemia    Hypertension    Family History  Problem Relation Age of Onset   Healthy Mother    Heart failure Father    Diabetes Sister    Past Surgical History:  Procedure Laterality Date   HERNIA REPAIR     Social History   Occupational History   Not on file  Tobacco Use   Smoking status: Never   Smokeless tobacco: Never  Vaping Use   Vaping status: Never Used  Substance and Sexual Activity   Alcohol use: Not Currently    Comment: occasionally   Drug use: No   Sexual activity: Not on file

## 2024-01-03 ENCOUNTER — Encounter: Payer: Self-pay | Admitting: Physical Medicine and Rehabilitation

## 2024-01-03 NOTE — Procedures (Signed)
 EMG & NCV Findings: Evaluation of the right median motor nerve showed prolonged distal onset latency (6.7 ms), reduced amplitude (1.2 mV), and decreased conduction velocity (Elbow-Wrist, 44 m/s).  The right median (across palm) sensory nerve showed prolonged distal peak latency (Wrist, 7.3 ms), reduced amplitude (8.3 V), and prolonged distal peak latency (Palm, 5.7 ms).  All remaining nerves (as indicated in the following tables) were within normal limits.    All examined muscles (as indicated in the following table) showed no evidence of electrical instability.    Impression: The above electrodiagnostic study is ABNORMAL and reveals evidence of a severe right median nerve entrapment at the wrist (carpal tunnel syndrome) affecting sensory and motor components.   There is no significant electrodiagnostic evidence of any other focal nerve entrapment, brachial plexopathy or cervical radiculopathy.   Recommendations: 1.  Follow-up with referring physician. 2.  Continue current management of symptoms. 3.  Suggest surgical evaluation.  ___________________________ Naaman Plummer FAAPMR Board Certified, American Board of Physical Medicine and Rehabilitation    Nerve Conduction Studies Anti Sensory Summary Table   Stim Site NR Peak (ms) Norm Peak (ms) P-T Amp (V) Norm P-T Amp Site1 Site2 Delta-P (ms) Dist (cm) Vel (m/s) Norm Vel (m/s)  Right Median Acr Palm Anti Sensory (2nd Digit)  31.1C  Wrist    *7.3 <3.6 *8.3 >10 Wrist Palm 1.6 0.0    Palm    *5.7 <2.0 4.1         Right Radial Anti Sensory (Base 1st Digit)  30.9C  Wrist    2.1 <3.1 27.1  Wrist Base 1st Digit 2.1 0.0    Right Ulnar Anti Sensory (5th Digit)  31.3C  Wrist    3.2 <3.7 17.5 >15.0 Wrist 5th Digit 3.2 14.0 44 >38   Motor Summary Table   Stim Site NR Onset (ms) Norm Onset (ms) O-P Amp (mV) Norm O-P Amp Site1 Site2 Delta-0 (ms) Dist (cm) Vel (m/s) Norm Vel (m/s)  Right Median Motor (Abd Poll Brev)  31.1C  Wrist    *6.7 <4.2  *1.2 >5 Elbow Wrist 4.8 21.0 *44 >50  Elbow    11.5  1.2         Right Ulnar Motor (Abd Dig Min)  31.3C  Wrist    2.8 <4.2 11.6 >3 B Elbow Wrist 3.2 19.0 59 >53  B Elbow    6.0  11.2  A Elbow B Elbow 1.2 10.0 83 >53  A Elbow    7.2  11.0          EMG   Side Muscle Nerve Root Ins Act Fibs Psw Amp Dur Poly Recrt Int Dennie Bible Comment  Right Abd Poll Brev Median C8-T1 Nml Nml Nml Nml Nml 0 Nml Nml   Right 1stDorInt Ulnar C8-T1 Nml Nml Nml Nml Nml 0 Nml Nml   Right PronatorTeres Median C6-7 Nml Nml Nml Nml Nml 0 Nml Nml   Right Biceps Musculocut C5-6 Nml Nml Nml Nml Nml 0 Nml Nml   Right Deltoid Axillary C5-6 Nml Nml Nml Nml Nml 0 Nml Nml     Nerve Conduction Studies Anti Sensory Left/Right Comparison   Stim Site L Lat (ms) R Lat (ms) L-R Lat (ms) L Amp (V) R Amp (V) L-R Amp (%) Site1 Site2 L Vel (m/s) R Vel (m/s) L-R Vel (m/s)  Median Acr Palm Anti Sensory (2nd Digit)  31.1C  Wrist  *7.3   *8.3  Wrist Palm     Palm  *5.7   4.1  Radial Anti Sensory (Base 1st Digit)  30.9C  Wrist  2.1   27.1  Wrist Base 1st Digit     Ulnar Anti Sensory (5th Digit)  31.3C  Wrist  3.2   17.5  Wrist 5th Digit  44    Motor Left/Right Comparison   Stim Site L Lat (ms) R Lat (ms) L-R Lat (ms) L Amp (mV) R Amp (mV) L-R Amp (%) Site1 Site2 L Vel (m/s) R Vel (m/s) L-R Vel (m/s)  Median Motor (Abd Poll Brev)  31.1C  Wrist  *6.7   *1.2  Elbow Wrist  *44   Elbow  11.5   1.2        Ulnar Motor (Abd Dig Min)  31.3C  Wrist  2.8   11.6  B Elbow Wrist  59   B Elbow  6.0   11.2  A Elbow B Elbow  83   A Elbow  7.2   11.0           Waveforms:

## 2024-01-09 ENCOUNTER — Other Ambulatory Visit: Payer: Self-pay

## 2024-01-10 ENCOUNTER — Other Ambulatory Visit: Payer: Self-pay

## 2024-01-15 IMAGING — CT CT ABD-PELV W/ CM
2 of 5 series · 15 of 46 positions shown, 17 images · IV contrast (agent unspecified)
Comparison: None Available.

CLINICAL DATA: Epigastric pain x3 days

EXAM:
CT ABDOMEN AND PELVIS WITH CONTRAST
TECHNIQUE: Multidetector CT imaging of the abdomen and pelvis was performed
using the standard protocol following bolus administration of
intravenous contrast.

[Series 3: abdomen 5.0 · axial · 0.83mm/px · z∈[+902,+1322]mm · 12 of 98 slices shown, 14 images]
[im 7/98  soft-tissue]
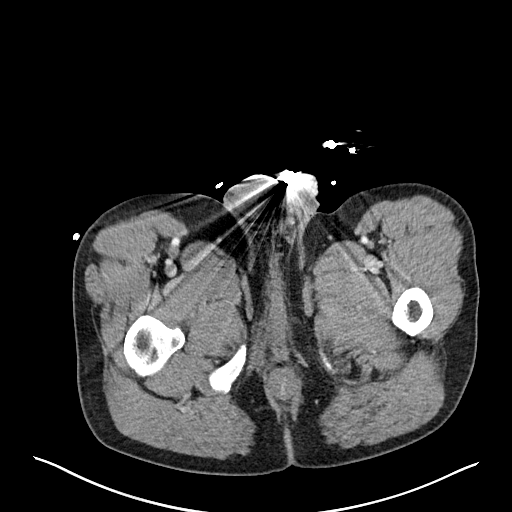
[im 7/98  bone]
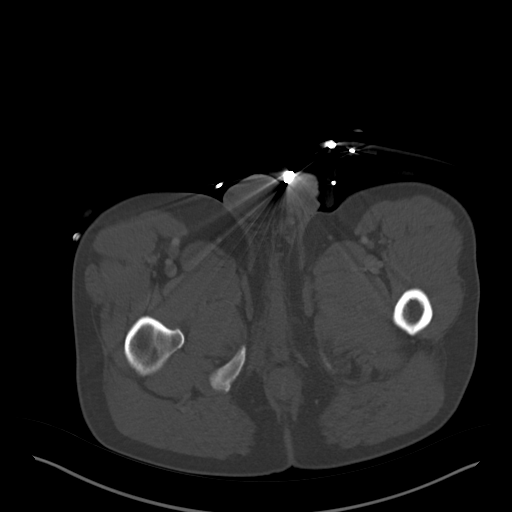
[im 13/98  soft-tissue]
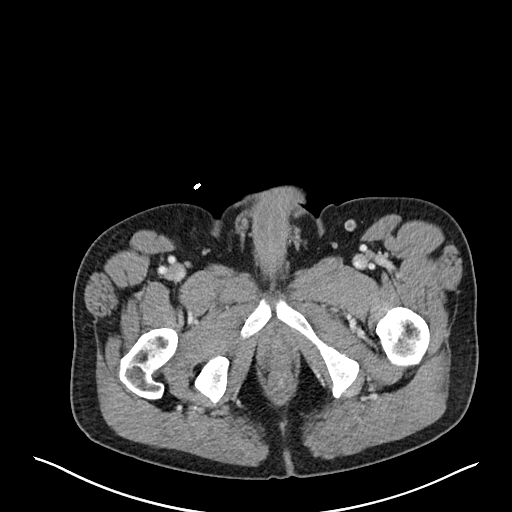
[im 20/98  soft-tissue]
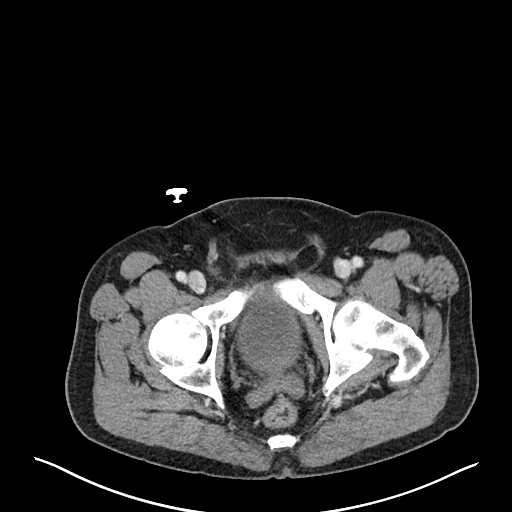
[im 33/98  soft-tissue]
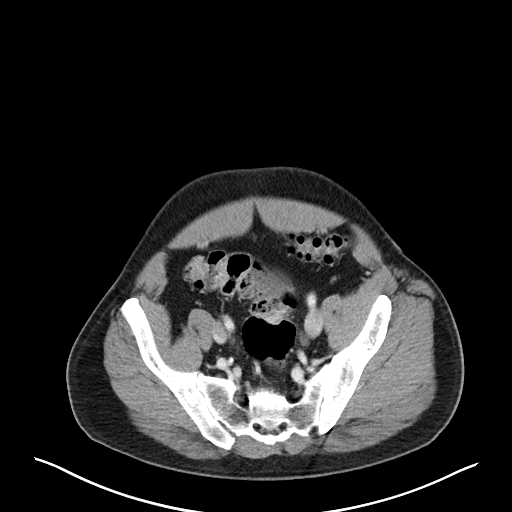
[im 39/98  soft-tissue]
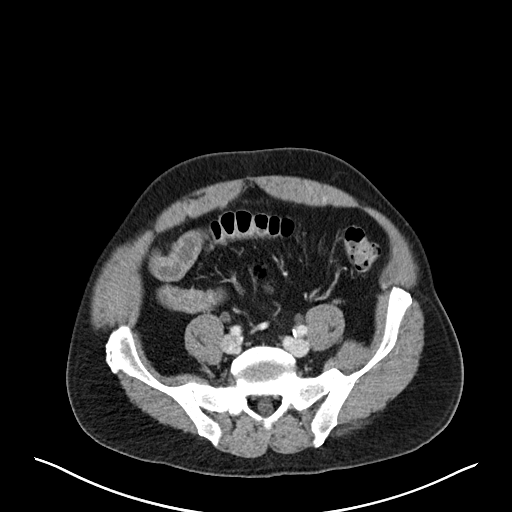
[im 46/98  soft-tissue]
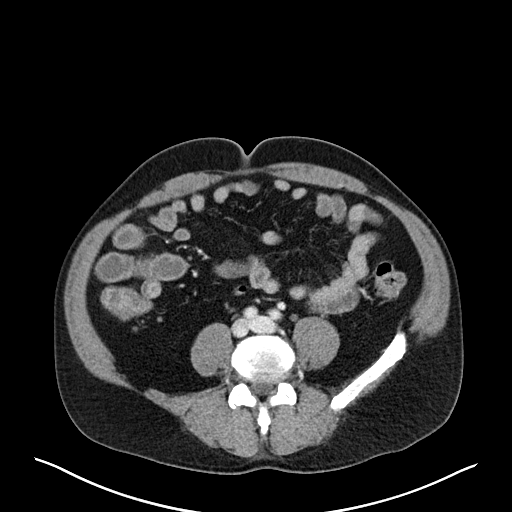
[im 52/98  soft-tissue]
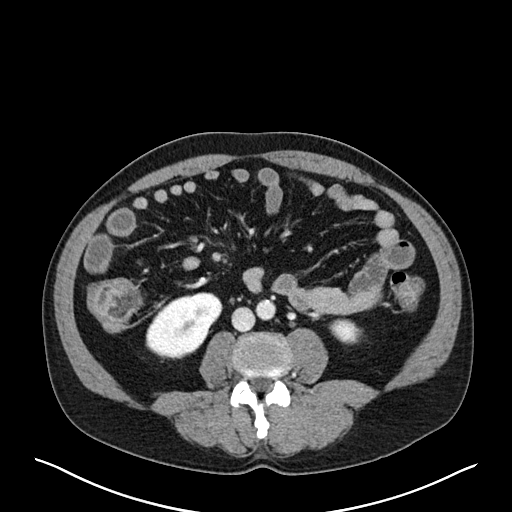
[im 59/98  soft-tissue]
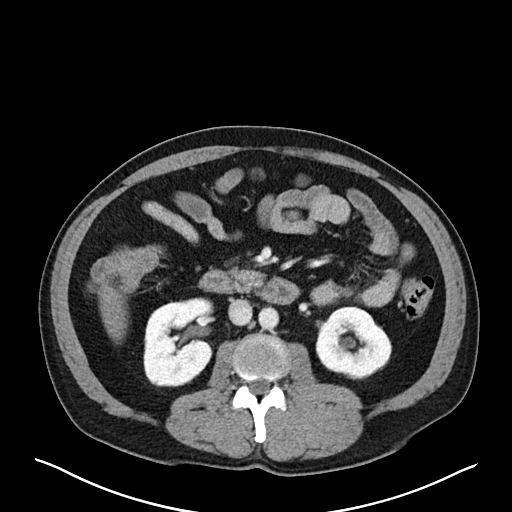
[im 65/98  soft-tissue]
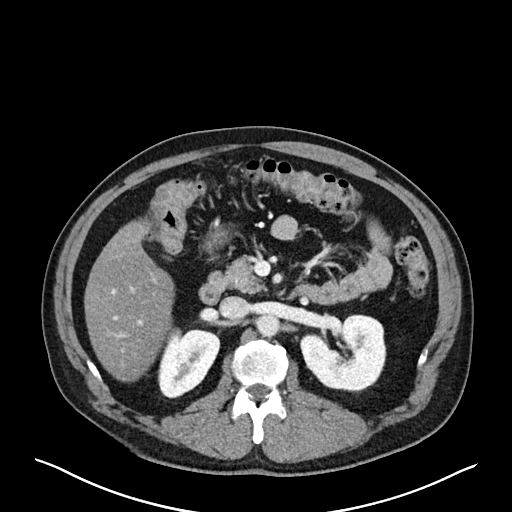
[im 65/98  bone]
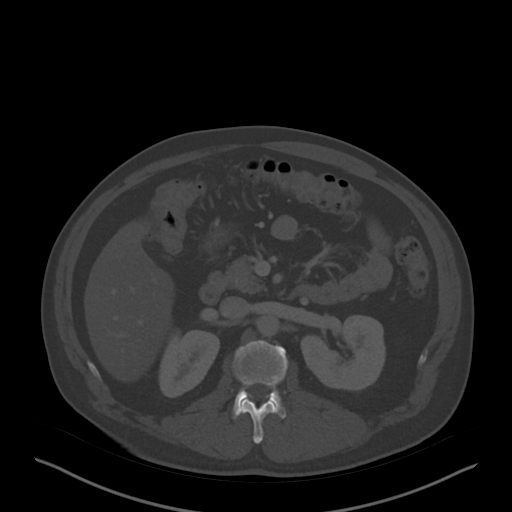
[im 78/98  soft-tissue]
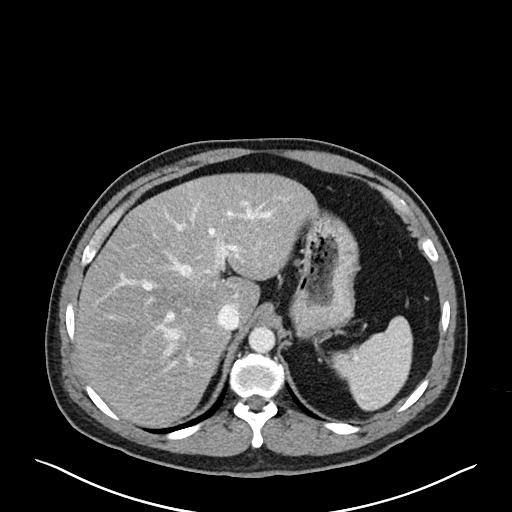
[im 85/98  soft-tissue]
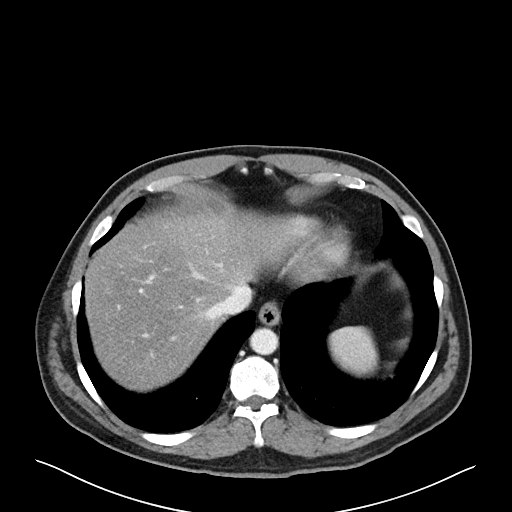
[im 91/98  soft-tissue]
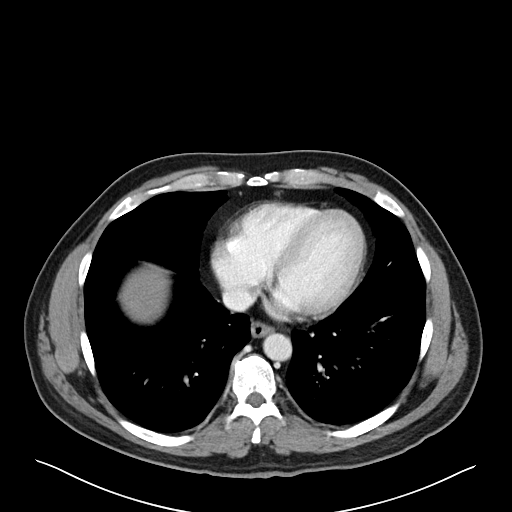

[Series 6: abdomen 3.0 mpr cor · coronal · 0.78mm/px · 3 of 100 slices shown]
[im 34/100  soft-tissue]
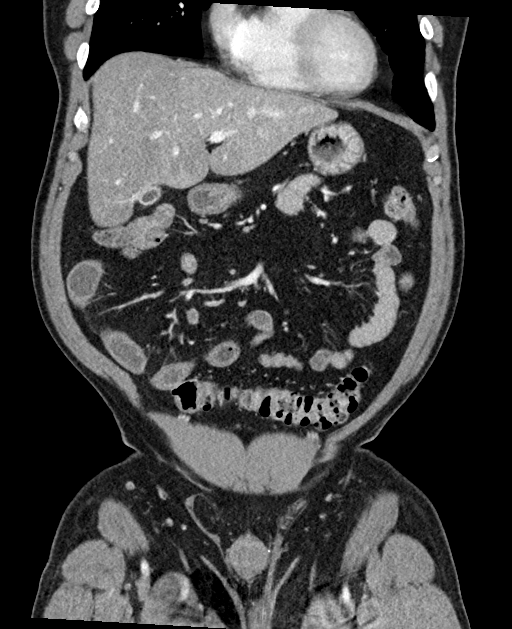
[im 45/100  soft-tissue]
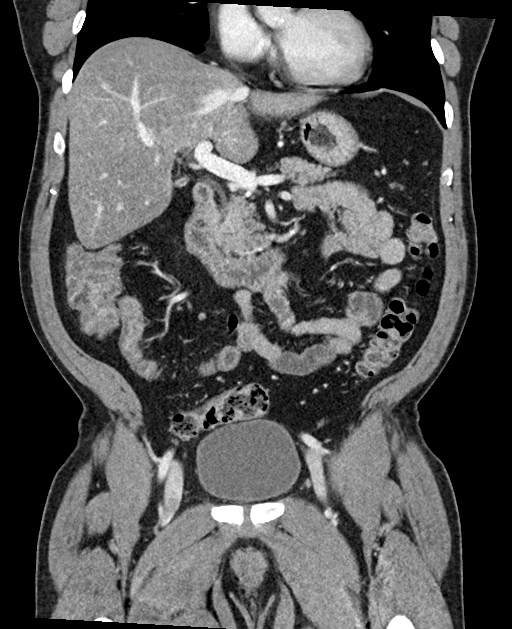
[im 56/100  soft-tissue]
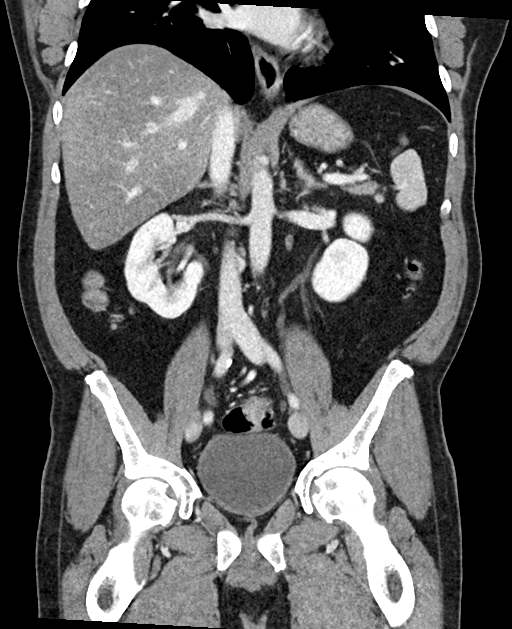

[15 of 46 positions shown; findings below may reference images not displayed]

RADIATION DOSE REDUCTION: This exam was performed according to the
departmental dose-optimization program which includes automated
exposure control, adjustment of the mA and/or kV according to
patient size and/or use of iterative reconstruction technique.

CONTRAST:  100mL OMNIPAQUE IOHEXOL 350 MG/ML SOLN
FINDINGS: Lower chest: No significant abnormality is seen.

Hepatobiliary: There is fatty infiltration in the liver. There is no
dilation of bile ducts. Gallbladder is contracted. Mild wall
thickening in gallbladder may be due to contracted state. There is
no fluid around the gallbladder.

Pancreas: No focal abnormality is seen.

Spleen: Unremarkable.

Adrenals/Urinary Tract: Adrenals are not enlarged. There is 6 mm
calculus in the upper pole of left kidney. There is 3 mm calculus in
the lower pole of left kidney. There is no hydronephrosis. Ureters
are not dilated. There is possible tiny cyst in the midportion left
kidney.

Stomach/Bowel: Stomach is not distended. There is mild mucosal
enhancement in the stomach. Small hiatal hernia is seen. Small bowel
loops are not dilated. Appendix is unremarkable. There is mild wall
thickening and mucosal enhancement in the distal ileum. There is
mild wall thickening and mucosal enhancement in the ascending colon.
There is no focal wall thickening in the colon. There is no
pericolic stranding.

Vascular/Lymphatic: Scattered arterial calcifications are seen.

Reproductive: There are coarse calcifications in the prostate.

Other: There is no ascites or pneumoperitoneum.

Musculoskeletal: No acute findings are seen.
IMPRESSION: There is no evidence of intestinal obstruction or pneumoperitoneum.
Appendix is unremarkable. There is no hydronephrosis.

There is mild mucosal enhancement in the stomach which may be due to
incomplete distention or suggest nonspecific gastritis. There is
mild wall thickening and mucosal enhancement in the distal ileum and
ascending colon. Please correlate for possible enterocolitis.

There are few nonobstructing left renal stones. Fatty liver. Mild
wall thickening in gallbladder may be due to contracted state or
suggest chronic cholecystitis. Small hiatal hernia.

## 2024-01-16 ENCOUNTER — Encounter: Payer: Self-pay | Admitting: Orthopaedic Surgery

## 2024-01-16 ENCOUNTER — Ambulatory Visit (INDEPENDENT_AMBULATORY_CARE_PROVIDER_SITE_OTHER): Payer: Self-pay | Admitting: Orthopaedic Surgery

## 2024-01-16 DIAGNOSIS — G5601 Carpal tunnel syndrome, right upper limb: Secondary | ICD-10-CM

## 2024-01-16 DIAGNOSIS — G5602 Carpal tunnel syndrome, left upper limb: Secondary | ICD-10-CM

## 2024-01-16 NOTE — Progress Notes (Signed)
 Office Visit Note   Patient: Shawn Porter           Date of Birth: 11-09-1970           MRN: [id number] Visit Date: 01/16/2024              Requested by: Claiborne Rigg, NP [address] [place],  [place] [address] PCP: Claiborne Rigg, NP   Assessment & Plan: Visit Diagnoses:  1. Right carpal tunnel syndrome   2. Left carpal tunnel syndrome     Plan: Patient is a 53 year old gentleman with severe right carpal tunnel syndrome and symptoms consistent with a left carpal tunnel syndrome.  For the right hand I have recommended surgery.  Risk benefits prognosis reviewed.  [name] will call him to confirm surgery date.  For the left hand we will provide him with brace to wear at nighttime.  He requested an injection in the left hand while he is in the operating theater.  Follow-Up Instructions: No follow-ups on file.   Orders:  No orders of the defined types were placed in this encounter.  No orders of the defined types were placed in this encounter.     Procedures: No procedures performed   Clinical Data: No additional findings.   Subjective: Chief Complaint  Patient presents with   Right Hand - Follow-up    EMG review    HPI Patient returns today with his wife and interpreter to review nerve conduction studies.  These shows severe right carpal tunnel syndrome.  He is reporting some mild symptoms reminiscent of carpal tunnel syndrome in the left hand. Review of Systems  Constitutional: Negative.   HENT: Negative.    Eyes: Negative.   Respiratory: Negative.    Cardiovascular: Negative.   Gastrointestinal: Negative.   Endocrine: Negative.   Genitourinary: Negative.   Skin: Negative.   Allergic/Immunologic: Negative.   Neurological: Negative.   Hematological: Negative.   Psychiatric/Behavioral: Negative.    All other systems reviewed and are negative.    Objective: Vital Signs: There were no vitals taken for this visit.  Physical  Exam Vitals and nursing note reviewed.  Constitutional:      Appearance: He is well-developed.  Pulmonary:     Effort: Pulmonary effort is normal.  Abdominal:     Palpations: Abdomen is soft.  Skin:    General: Skin is warm.  Neurological:     Mental Status: He is alert and oriented to person, place, and time.  Psychiatric:        Behavior: Behavior normal.        Thought Content: Thought content normal.        Judgment: Judgment normal.     Ortho Exam Examinations of bilateral hands are unchanged from prior visit. Specialty Comments:  No specialty comments available.  Imaging: No results found.   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/2022   Discoloration of eye 11/23/2016   Palpitations 11/23/2016   Dyslipidemia 11/23/2016   Wound infection after surgery 11/06/2015   Sebaceous cyst 10/19/2015   Health care maintenance 10/01/2015   Right inguinal hernia 10/27/2014   Hypertriglyceridemia 10/27/2014   Pain in joint, shoulder region 10/27/2014   Essential hypertension, benign 08/26/2014   Dental caries 08/26/2014   Radicular pain in right arm 10/16/2013   Right arm pain 07/19/2013   Past Medical History:  Diagnosis Date   Hyperlipidemia    Hypertension  Family History  Problem Relation Age of Onset   Healthy Mother    Heart failure Father    Diabetes Sister     Past Surgical History:  Procedure Laterality Date   HERNIA REPAIR     Social History   Occupational History   Not on file  Tobacco Use   Smoking status: Never   Smokeless tobacco: Never  Vaping Use   Vaping status: Never Used  Substance and Sexual Activity   Alcohol use: Not Currently    Comment: occasionally   Drug use: No   Sexual activity: Not on file

## 2024-02-14 ENCOUNTER — Other Ambulatory Visit: Payer: Self-pay

## 2024-02-14 ENCOUNTER — Encounter (HOSPITAL_BASED_OUTPATIENT_CLINIC_OR_DEPARTMENT_OTHER): Payer: Self-pay | Admitting: Orthopaedic Surgery

## 2024-02-19 ENCOUNTER — Encounter (HOSPITAL_BASED_OUTPATIENT_CLINIC_OR_DEPARTMENT_OTHER)
Admission: RE | Admit: 2024-02-19 | Discharge: 2024-02-19 | Disposition: A | Discharge status: Home / Self Care | Payer: Self-pay | Source: Ambulatory Visit | Attending: Orthopaedic Surgery | Admitting: Orthopaedic Surgery

## 2024-02-19 ENCOUNTER — Other Ambulatory Visit: Payer: Self-pay

## 2024-02-19 ENCOUNTER — Other Ambulatory Visit: Payer: Self-pay | Admitting: Physician Assistant

## 2024-02-19 DIAGNOSIS — Z01812 Encounter for preprocedural laboratory examination: Secondary | ICD-10-CM | POA: Insufficient documentation

## 2024-02-19 MED ORDER — HYDROCODONE-ACETAMINOPHEN 5-325 MG PO TABS
1.0000 | ORAL_TABLET | Freq: Three times a day (TID) | ORAL | 0 refills | Status: DC | PRN
  Filled 2024-02-19: qty 21, 7d supply, fill #0

## 2024-02-19 MED ORDER — ONDANSETRON HCL 4 MG PO TABS
4.0000 mg | ORAL_TABLET | Freq: Three times a day (TID) | ORAL | 0 refills | Status: DC | PRN
  Filled 2024-02-19: qty 40, 14d supply, fill #0

## 2024-02-19 MED ORDER — TRAMADOL HCL 50 MG PO TABS
50.0000 mg | ORAL_TABLET | Freq: Three times a day (TID) | ORAL | 0 refills | Status: DC | PRN
  Filled 2024-02-19: qty 21, 7d supply, fill #0

## 2024-02-19 NOTE — Progress Notes (Signed)

## 2024-02-21 ENCOUNTER — Ambulatory Visit (HOSPITAL_BASED_OUTPATIENT_CLINIC_OR_DEPARTMENT_OTHER): Payer: Self-pay | Admitting: Anesthesiology

## 2024-02-21 ENCOUNTER — Encounter (HOSPITAL_BASED_OUTPATIENT_CLINIC_OR_DEPARTMENT_OTHER)
Admission: RE | Disposition: A | Discharge status: Home / Self Care | Payer: Self-pay | Source: Home / Self Care | Attending: Orthopaedic Surgery

## 2024-02-21 ENCOUNTER — Encounter (HOSPITAL_BASED_OUTPATIENT_CLINIC_OR_DEPARTMENT_OTHER): Payer: Self-pay | Admitting: Orthopaedic Surgery

## 2024-02-21 ENCOUNTER — Ambulatory Visit (HOSPITAL_BASED_OUTPATIENT_CLINIC_OR_DEPARTMENT_OTHER)
Admission: RE | Admit: 2024-02-21 | Discharge: 2024-02-21 | Disposition: A | Discharge status: Home / Self Care | Payer: Self-pay | Attending: Orthopaedic Surgery | Admitting: Orthopaedic Surgery

## 2024-02-21 ENCOUNTER — Other Ambulatory Visit: Payer: Self-pay

## 2024-02-21 DIAGNOSIS — G5601 Carpal tunnel syndrome, right upper limb: Secondary | ICD-10-CM | POA: Insufficient documentation

## 2024-02-21 DIAGNOSIS — Z79899 Other long term (current) drug therapy: Secondary | ICD-10-CM | POA: Insufficient documentation

## 2024-02-21 DIAGNOSIS — G5603 Carpal tunnel syndrome, bilateral upper limbs: Secondary | ICD-10-CM

## 2024-02-21 DIAGNOSIS — I10 Essential (primary) hypertension: Secondary | ICD-10-CM | POA: Insufficient documentation

## 2024-02-21 DIAGNOSIS — G5602 Carpal tunnel syndrome, left upper limb: Secondary | ICD-10-CM | POA: Insufficient documentation

## 2024-02-21 HISTORY — DX: Carpal tunnel syndrome, unspecified upper limb: G56.00

## 2024-02-21 SURGERY — CARPAL TUNNEL RELEASE
Anesthesia: Monitor Anesthesia Care | Site: Wrist | Laterality: Right

## 2024-02-21 MED ORDER — LIDOCAINE-EPINEPHRINE (PF) 1 %-1:200000 IJ SOLN
INTRAMUSCULAR | Status: DC | PRN
  Administered 2024-02-21: 10 mL

## 2024-02-21 MED ORDER — FENTANYL CITRATE (PF) 100 MCG/2ML IJ SOLN
INTRAMUSCULAR | Status: DC | PRN
Start: 2024-02-21 — End: 2024-02-21
  Administered 2024-02-21: 25 ug via INTRAVENOUS

## 2024-02-21 MED ORDER — PROPOFOL 10 MG/ML IV BOLUS
INTRAVENOUS | Status: DC | PRN
  Administered 2024-02-21 (×3): 10 mg via INTRAVENOUS

## 2024-02-21 MED ORDER — METHYLPREDNISOLONE ACETATE 40 MG/ML IJ SUSP
INTRAMUSCULAR | Status: DC | PRN
  Administered 2024-02-21: 40 mg via INTRA_ARTICULAR

## 2024-02-21 MED ORDER — CEFAZOLIN SODIUM-DEXTROSE 2-4 GM/100ML-% IV SOLN
INTRAVENOUS | Status: AC
  Filled 2024-02-21: qty 100

## 2024-02-21 MED ORDER — METHYLPREDNISOLONE ACETATE 40 MG/ML IJ SUSP
INTRAMUSCULAR | Status: AC
  Filled 2024-02-21: qty 1

## 2024-02-21 MED ORDER — DEXMEDETOMIDINE HCL IN NACL 80 MCG/20ML IV SOLN
INTRAVENOUS | Status: DC | PRN
  Administered 2024-02-21 (×2): 4 ug via INTRAVENOUS

## 2024-02-21 MED ORDER — PROPOFOL 500 MG/50ML IV EMUL
INTRAVENOUS | Status: DC | PRN
  Administered 2024-02-21: 125 ug/kg/min via INTRAVENOUS

## 2024-02-21 MED ORDER — LACTATED RINGERS IV SOLN: INTRAVENOUS | Status: DC

## 2024-02-21 MED ORDER — MIDAZOLAM HCL 2 MG/2ML IJ SOLN
INTRAMUSCULAR | Status: AC
  Filled 2024-02-21: qty 2

## 2024-02-21 MED ORDER — ACETAMINOPHEN 500 MG PO TABS
ORAL_TABLET | ORAL | Status: AC
  Filled 2024-02-21: qty 2

## 2024-02-21 MED ORDER — OXYCODONE HCL 5 MG/5ML PO SOLN: 5.0000 mg | Freq: Once | ORAL | Status: DC | PRN

## 2024-02-21 MED ORDER — KETOROLAC TROMETHAMINE 30 MG/ML IJ SOLN
INTRAMUSCULAR | Status: DC | PRN
  Administered 2024-02-21: 30 mg via INTRAVENOUS

## 2024-02-21 MED ORDER — BUPIVACAINE-EPINEPHRINE (PF) 0.5% -1:200000 IJ SOLN: INTRAMUSCULAR | Status: DC | PRN

## 2024-02-21 MED ORDER — FENTANYL CITRATE (PF) 100 MCG/2ML IJ SOLN
INTRAMUSCULAR | Status: AC
  Filled 2024-02-21: qty 2

## 2024-02-21 MED ORDER — LIDOCAINE HCL (CARDIAC) PF 100 MG/5ML IV SOSY
PREFILLED_SYRINGE | INTRAVENOUS | Status: DC | PRN
  Administered 2024-02-21: 20 mg via INTRAVENOUS

## 2024-02-21 MED ORDER — FENTANYL CITRATE (PF) 100 MCG/2ML IJ SOLN: 25.0000 ug | INTRAMUSCULAR | Status: DC | PRN

## 2024-02-21 MED ORDER — ONDANSETRON HCL 4 MG/2ML IJ SOLN
INTRAMUSCULAR | Status: DC | PRN
  Administered 2024-02-21: 4 mg via INTRAVENOUS

## 2024-02-21 MED ORDER — OXYCODONE HCL 5 MG PO TABS: 5.0000 mg | ORAL_TABLET | Freq: Once | ORAL | Status: DC | PRN

## 2024-02-21 MED ORDER — ACETAMINOPHEN 500 MG PO TABS
1000.0000 mg | ORAL_TABLET | Freq: Once | ORAL | Status: AC
  Administered 2024-02-21: 1000 mg via ORAL

## 2024-02-21 MED ORDER — DROPERIDOL 2.5 MG/ML IJ SOLN: 0.6250 mg | Freq: Once | INTRAMUSCULAR | Status: DC | PRN

## 2024-02-21 MED ORDER — BUPIVACAINE HCL (PF) 0.25 % IJ SOLN
INTRAMUSCULAR | Status: DC | PRN
  Administered 2024-02-21: 10 mL

## 2024-02-21 MED ORDER — CEFAZOLIN SODIUM-DEXTROSE 2-4 GM/100ML-% IV SOLN
2.0000 g | INTRAVENOUS | Status: AC
  Administered 2024-02-21: 2 g via INTRAVENOUS

## 2024-02-21 MED ORDER — MIDAZOLAM HCL 2 MG/2ML IJ SOLN
INTRAMUSCULAR | Status: DC | PRN
  Administered 2024-02-21: 2 mg via INTRAVENOUS

## 2024-02-21 SURGICAL SUPPLY — 46 items
BAND RUBBER #18 3X1/16 STRL (MISCELLANEOUS) ×4 IMPLANT
BLADE MINI RND TIP GREEN BEAV (BLADE) ×2 IMPLANT
BLADE SURG 15 STRL LF DISP TIS (BLADE) ×2 IMPLANT
BNDG ADH 1X3 SHEER STRL LF (GAUZE/BANDAGES/DRESSINGS) ×4 IMPLANT
BNDG ELASTIC 3INX 5YD STR LF (GAUZE/BANDAGES/DRESSINGS) ×2 IMPLANT
BNDG ESMARK 4X9 LF (GAUZE/BANDAGES/DRESSINGS) ×2 IMPLANT
BRUSH SCRUB EZ PLAIN DRY (MISCELLANEOUS) ×2 IMPLANT
CANISTER SUCT 1200ML W/VALVE (MISCELLANEOUS) ×2 IMPLANT
CORD BIPOLAR FORCEPS 12FT (ELECTRODE) ×2 IMPLANT
COVER BACK TABLE 60X90IN (DRAPES) ×2 IMPLANT
CUFF TOURN SGL QUICK 18X4 (TOURNIQUET CUFF) ×2 IMPLANT
DRAPE EXTREMITY T 121X128X90 (DISPOSABLE) ×2 IMPLANT
DRAPE SURG 17X23 STRL (DRAPES) ×2 IMPLANT
DRAPE U-SHAPE 47X51 STRL (DRAPES) ×2 IMPLANT
DURAPREP 26ML APPLICATOR (WOUND CARE) ×2 IMPLANT
GAUZE SPONGE 4X4 12PLY STRL (GAUZE/BANDAGES/DRESSINGS) ×2 IMPLANT
GAUZE XEROFORM 1X8 LF (GAUZE/BANDAGES/DRESSINGS) ×2 IMPLANT
GLOVE BIOGEL PI IND STRL 7.5 (GLOVE) ×2 IMPLANT
GLOVE ECLIPSE 7.0 STRL STRAW (GLOVE) ×2 IMPLANT
GLOVE INDICATOR 7.0 STRL GRN (GLOVE) ×2 IMPLANT
GLOVE SURG SYN 7.5 E (GLOVE) ×4 IMPLANT
GLOVE SURG SYN 7.5 PF PI (GLOVE) ×4 IMPLANT
GOWN STRL REUS W/ TWL LRG LVL3 (GOWN DISPOSABLE) ×2 IMPLANT
GOWN STRL SURGICAL XL XLNG (GOWN DISPOSABLE) ×4 IMPLANT
NDL HYPO 25X1 1.5 SAFETY (NEEDLE) ×2 IMPLANT
NDL PRECISIONGLIDE 27X1.5 (NEEDLE) IMPLANT
NDL SAFETY ECLIPSE 18X1.5 (NEEDLE) ×2 IMPLANT
NEEDLE HYPO 25X1 1.5 SAFETY (NEEDLE) ×2 IMPLANT
NEEDLE PRECISIONGLIDE 27X1.5 (NEEDLE) IMPLANT
NS IRRIG 1000ML POUR BTL (IV SOLUTION) ×2 IMPLANT
PACK BASIN DAY SURGERY FS (CUSTOM PROCEDURE TRAY) ×2 IMPLANT
PAD ALCOHOL SWAB (MISCELLANEOUS) ×4 IMPLANT
PAD CAST 3X4 CTTN HI CHSV (CAST SUPPLIES) ×2 IMPLANT
SHEET MEDIUM DRAPE 40X70 STRL (DRAPES) ×2 IMPLANT
SPIKE FLUID TRANSFER (MISCELLANEOUS) IMPLANT
SPONGE T-LAP 18X18 ~~LOC~~+RFID (SPONGE) ×2 IMPLANT
STOCKINETTE 4X48 STRL (DRAPES) ×2 IMPLANT
SUT ETHILON 3 0 PS 1 (SUTURE) ×2 IMPLANT
SUT ETHILON 4 0 PS 2 18 (SUTURE) IMPLANT
SWABSTICK POVIDONE IODINE SNGL (MISCELLANEOUS) ×6 IMPLANT
SYR 3ML 23GX1 SAFETY (SYRINGE) ×2 IMPLANT
SYR BULB EAR ULCER 3OZ GRN STR (SYRINGE) ×2 IMPLANT
SYR CONTROL 10ML LL (SYRINGE) IMPLANT
TOWEL GREEN STERILE FF (TOWEL DISPOSABLE) ×2 IMPLANT
TRAY DSU PREP LF (CUSTOM PROCEDURE TRAY) ×2 IMPLANT
UNDERPAD 30X36 HEAVY ABSORB (UNDERPADS AND DIAPERS) ×2 IMPLANT

## 2024-02-21 NOTE — H&P (Signed)
 PREOPERATIVE H&P  Chief Complaint: right carpal tunnel syndrome, left carplal tunnel syndrome  HPI: Shawn Porter is a 53 y.o. male who presents for surgical treatment of right carpal tunnel syndrome, left carplal tunnel syndrome.  He denies any changes in medical history.  Past Surgical History:  Procedure Laterality Date   HERNIA REPAIR     Social History   Socioeconomic History   Marital status: Married    Spouse name: Not on file   Number of children: Not on file   Years of education: Not on file   Highest education level: Not on file  Occupational History   Not on file  Tobacco Use   Smoking status: Never   Smokeless tobacco: Never  Vaping Use   Vaping status: Never Used  Substance and Sexual Activity   Alcohol use: Not Currently    Comment: occasionally   Drug use: No   Sexual activity: Not on file  Other Topics Concern   Not on file  Social History Narrative   Not on file   Social Drivers of Health   Financial Resource Strain: Not on file  Food Insecurity: No Food Insecurity (11/05/2022)   Hunger Vital Sign    Worried About Running Out of Food in the Last Year: Never true    Ran Out of Food in the Last Year: Never true  Transportation Needs: No Transportation Needs (11/05/2022)   PRAPARE - Administrator, Civil Service (Medical): No    Lack of Transportation (Non-Medical): No  Physical Activity: Not on file  Stress: Not on file  Social Connections: Not on file   Family History  Problem Relation Age of Onset   Healthy Mother    Heart failure Father    Diabetes Sister    Allergies  Allergen Reactions   Wound Dressing Adhesive Rash   Prior to Admission medications   Medication Sig Start Date End Date Taking? Authorizing Provider  amLODipine  (NORVASC ) 10 MG tablet Take 1 tablet (10 mg total) by mouth daily. 12/12/23  Yes Collins Dean, NP  lisinopril  (ZESTRIL ) 10 MG tablet Take by mouth. 10/27/14  Yes [provider]   omega-3 acid ethyl esters (LOVAZA ) 1 g capsule Take 2 capsules (2 g total) by mouth 2 (two) times daily. 12/12/23 06/09/24 Yes Fleming, Zelda W, NP  rosuvastatin  (CRESTOR ) 20 MG tablet Take 1 tablet (20 mg total) by mouth daily. 12/12/23  Yes Collins Dean, NP  aspirin  EC 81 MG tablet Take 81 mg by mouth 3 (three) times a week. Swallow whole.    [provider]  gabapentin  (NEURONTIN ) 300 MG capsule Take 1 capsule (300 mg total) by mouth 3 (three) times daily. Patient not taking: Reported on 02/14/2024 12/12/23   Fleming, Zelda W, NP  HYDROcodone -acetaminophen  (NORCO/VICODIN) 5-325 MG tablet Take 1 tablet by mouth 3 (three) times daily as needed for moderate pain (pain score 4-6). To be taken after surgery for pain (instead of tramadol ) 02/19/24   Sandie Cross, PA-C  meloxicam  (MOBIC ) 15 MG tablet Take 1 tablet (15 mg total) by mouth daily. Prn pain Patient not taking: Reported on 02/14/2024 11/30/23   Hassie Lint, PA-C  ondansetron  (ZOFRAN ) 4 MG tablet Take 1 tablet (4 mg total) by mouth every 8 (eight) hours as needed for nausea or vomiting. 02/19/24   Sandie Cross, PA-C     Positive ROS: All other systems have been reviewed and were otherwise negative with the exception of  those mentioned in the HPI and as above.  Physical Exam: General: Alert, no acute distress Cardiovascular: No pedal edema Respiratory: No cyanosis, no use of accessory musculature GI: abdomen soft Skin: No lesions in the area of chief complaint Neurologic: Sensation intact distally Psychiatric: Patient is competent for consent with normal mood and affect Lymphatic: no lymphedema  MUSCULOSKELETAL: exam stable  Assessment: right carpal tunnel syndrome, left carplal tunnel syndrome  Plan: Plan for Procedure(s): CARPAL TUNNEL RELEASE STEROID INJECTION  The risks benefits and alternatives were discussed with the patient including but not limited to the risks of nonoperative treatment, versus  surgical intervention including infection, bleeding, nerve injury,  blood clots, cardiopulmonary complications, morbidity, mortality, among others, and they were willing to proceed.   Claria Crofts, MD 02/21/2024 11:22 AM

## 2024-02-21 NOTE — Op Note (Signed)
   Carpal tunnel op note  DATE OF SURGERY:02/21/2024  PREOPERATIVE DIAGNOSIS:   Right carpal tunnel syndrome Left carpal tunnel syndrome  POSTOPERATIVE DIAGNOSIS: same  PROCEDURE:  Right carpal tunnel release. CPT [phone] Left carpal tunnel release  SURGEON: Indiyah Paone Claria Crofts, M.D.  ASSIST: Sharran Decent, [place]  ANESTHESIA:  Local and MAC  TOURNIQUET TIME: less than 20 minutes  BLOOD LOSS: Minimal.  COMPLICATIONS: None.  PATHOLOGY: None.  INDICATIONS: The patient is a 53 y.o. -year-old male who presented with carpal tunnel syndrome failing nonsurgical management, indicated for surgical release.  DESCRIPTION OF PROCEDURE: The patient was identified in the preoperative holding area.  The operative site was marked by the surgeon and confirmed by the patient.  The patient was brought back to the operating room.  MAC anesthesia was administered.  Local anesthetic with epi was injected into the operative site.  A well padded nonsterile tourniquet was placed. The operative extremity was prepped and draped in standard sterile fashion.  A timeout was performed.  Preoperative antibiotics were given.   A palmar incision was made about 5 mm ulnar to the thenar crease.  The palmar aponeurosis was exposed and divided in line with the skin incision. The palmaris brevis was visualized and divided.  The distal edge of the transcarpal ligament was identified. A hemostat was inserted into the carpal tunnel to protect the median nerve and the flexor tendons. Then, the transverse carpal ligament was released under direct visualization. Proximally, a subcutaneous tunnel was made allowing a Sewell retractor to be placed. Then, the distal portion of the antebrachial fascia was released. Distally, all fibrous bands were released. The median nerve was visualized, and the fat pad was exposed. Wound was irrigated and closed with 3-0 nylon sutures. Sterile dressing applied.  Left carpal tunnel injection was  performed under sterile conditions using a mixture of 1 cc each of marcaine , lidocaine  and depomedrol.  Band aid was applied.  The patient was transferred to the recovery room in stable condition after all counts were correct.  POSTOPERATIVE PLAN: To start nerve gliding exercises as tolerated and no heavy lifting for four weeks.  Claria Crofts, M.D. [facility] 1:40 PM

## 2024-02-21 NOTE — Discharge Instructions (Addendum)
 Postoperative instructions:  Weightbearing instructions: don't lift more than 10 lbs for 4 weeks  Dressing instructions: Keep your dressing and/or splint clean and dry at all times.  It will be removed at your first post-operative appointment.  Your stitches and/or staples will be removed at this visit.  Incision instructions:  Do not soak your incision for 3 weeks after surgery.  If the incision gets wet, pat dry and do not scrub the incision.  Pain control:  You have been given a prescription to be taken as directed for post-operative pain control.  In addition, elevate the operative extremity above the heart at all times to prevent swelling and throbbing pain.  Take over-the-counter Colace, 100mg  by mouth twice a day while taking narcotic pain medications to help prevent constipation.  Follow up appointments: 1) 10 days for suture removal and wound check. 2) Dr. Christiane Cowing as scheduled.   -------------------------------------------------------------------------------------------------------------  After Surgery Pain Control:  After your surgery, post-surgical discomfort or pain is likely. This discomfort can last several days to a few weeks. At certain times of the day your discomfort may be more intense.  Did you receive a nerve block?  A nerve block can provide pain relief for one hour to two days after your surgery. As long as the nerve block is working, you will experience little or no sensation in the area the surgeon operated on.  As the nerve block wears off, you will begin to experience pain or discomfort. It is very important that you begin taking your prescribed pain medication before the nerve block fully wears off. Treating your pain at the first sign of the block wearing off will ensure your pain is better controlled and more tolerable when full-sensation returns. Do not wait until the pain is intolerable, as the medicine will be less effective. It is better to treat pain in  advance than to try and catch up.  General Anesthesia:  If you did not receive a nerve block during your surgery, you will need to start taking your pain medication shortly after your surgery and should continue to do so as prescribed by your surgeon.  Pain Medication:  Most commonly we prescribe Vicodin and Percocet for post-operative pain. Both of these medications contain a combination of acetaminophen  (Tylenol ) and a narcotic to help control pain.   It takes between 30 and 45 minutes before pain medication starts to work. It is important to take your medication before your pain level gets too intense.   Nausea is a common side effect of many pain medications. You will want to eat something before taking your pain medicine to help prevent nausea.   If you are taking a prescription pain medication that contains acetaminophen , we recommend that you do not take additional over the counter acetaminophen  (Tylenol ).  Other pain relieving options:   Using a cold pack to ice the affected area a few times a day (15 to 20 minutes at a time) can help to relieve pain, reduce swelling and bruising.   Elevation of the affected area can also help to reduce pain and swelling.  Per [facility] clinic policy, our goal is ensure optimal postoperative pain control with a multimodal pain management strategy. For all [facility] patients, our goal is to wean post-operative narcotic medications by 6 weeks post-operatively. If this is not possible due to utilization of pain medication prior to surgery, your [facility] doctor will support your acute post-operative pain control for the first 6 weeks postoperatively, with a plan  to transition you back to your primary pain team following that. Max Spain will work to ensure a Therapist, occupational.  No Tylenol  before 6:30pm. No ibuprofen  before 9:30pm.  Post Anesthesia Home Care Instructions  Activity: Get plenty of rest for the remainder of the day. A responsible individual  must stay with you for 24 hours following the procedure.  For the next 24 hours, DO NOT: -Drive a car -Advertising copywriter -Drink alcoholic beverages -Take any medication unless instructed by your physician -Make any legal decisions or sign important papers.  Meals: Start with liquid foods such as gelatin or soup. Progress to regular foods as tolerated. Avoid greasy, spicy, heavy foods. If nausea and/or vomiting occur, drink only clear liquids until the nausea and/or vomiting subsides. Call your physician if vomiting continues.  Special Instructions/Symptoms: Your throat may feel dry or sore from the anesthesia or the breathing tube placed in your throat during surgery. If this causes discomfort, gargle with warm salt water. The discomfort should disappear within 24 hours.

## 2024-02-21 NOTE — Anesthesia Postprocedure Evaluation (Signed)
 Anesthesia Post Note  Patient: Shawn Porter  Procedure(s) Performed: CARPAL TUNNEL RELEASE (Right: Wrist) STEROID INJECTION (Left: Wrist)     Patient location during evaluation: PACU Anesthesia Type: MAC Level of consciousness: awake and alert Pain management: pain level controlled Vital Signs Assessment: post-procedure vital signs reviewed and stable Respiratory status: spontaneous breathing, nonlabored ventilation, respiratory function stable and patient connected to nasal cannula oxygen Cardiovascular status: stable and blood pressure returned to baseline Postop Assessment: no apparent nausea or vomiting Anesthetic complications: no  No notable events documented.  Last Vitals:  Vitals:   02/21/24 1445 02/21/24 1515  BP: 132/79 122/88  Pulse: (!) 57 60  Resp: 18 15  Temp:  (!) 36.2 C  SpO2: 94% 97%    Last Pain:  Vitals:   02/21/24 1515  TempSrc:   PainSc: 0-No pain                 Melvenia Stabs

## 2024-02-21 NOTE — Transfer of Care (Signed)
 Immediate Anesthesia Transfer of Care Note  Patient: Shawn Porter  Procedure(s) Performed: CARPAL TUNNEL RELEASE (Right: Wrist) STEROID INJECTION (Left: Wrist)  Patient Location: PACU  Anesthesia Type:MAC  Level of Consciousness: drowsy  Airway & Oxygen Therapy: Patient Spontanous Breathing and Patient connected to face mask oxygen  Post-op Assessment: Report given to RN and Post -op Vital signs reviewed and stable  Post vital signs: Reviewed and stable  Last Vitals:  Vitals Value Taken Time  BP 127/74 02/21/24 1350  Temp    Pulse 63 02/21/24 1352  Resp 19 02/21/24 1352  SpO2 98 % 02/21/24 1352  Vitals shown include unfiled device data.  Last Pain:  Vitals:   02/21/24 1206  TempSrc: Temporal  PainSc: 0-No pain         Complications: No notable events documented.

## 2024-02-21 NOTE — Anesthesia Preprocedure Evaluation (Signed)
 Anesthesia Evaluation  Patient identified by MRN, date of birth, ID band Patient awake    Reviewed: Allergy & Precautions, NPO status , Patient's Chart, lab work & pertinent test results  Airway Mallampati: II  TM Distance: >3 FB Neck ROM: Full    Dental  (+) Dental Advisory Given   Pulmonary neg pulmonary ROS   breath sounds clear to auscultation       Cardiovascular hypertension, Pt. on medications  Rhythm:Regular Rate:Normal     Neuro/Psych  Neuromuscular disease    GI/Hepatic negative GI ROS, Neg liver ROS,,,  Endo/Other  negative endocrine ROS    Renal/GU negative Renal ROS     Musculoskeletal   Abdominal   Peds  Hematology negative hematology ROS (+)   Anesthesia Other Findings   Reproductive/Obstetrics                             Anesthesia Physical Anesthesia Plan  ASA: 2  Anesthesia Plan: MAC   Post-op Pain Management: Tylenol  PO (pre-op)* and Toradol  IV (intra-op)*   Induction: Intravenous  PONV Risk Score and Plan: 1 and Propofol  infusion and Ondansetron   Airway Management Planned: Natural Airway and Simple Face Mask  Additional Equipment:   Intra-op Plan:   Post-operative Plan:   Informed Consent: I have reviewed the patients History and Physical, chart, labs and discussed the procedure including the risks, benefits and alternatives for the proposed anesthesia with the patient or authorized representative who has indicated his/her understanding and acceptance.       Plan Discussed with: CRNA  Anesthesia Plan Comments:        Anesthesia Quick Evaluation

## 2024-02-22 ENCOUNTER — Encounter (HOSPITAL_BASED_OUTPATIENT_CLINIC_OR_DEPARTMENT_OTHER): Payer: Self-pay | Admitting: Orthopaedic Surgery

## 2024-02-22 ENCOUNTER — Telehealth: Payer: Self-pay | Admitting: Physician Assistant

## 2024-02-22 NOTE — Telephone Encounter (Signed)
 Pt states the pain medication Percocet  he was given is not helping with the pain. Pt request something stronger.

## 2024-02-22 NOTE — Telephone Encounter (Signed)
Talked with patient and advised him of message below. Patient voiced that he understands. °

## 2024-02-22 NOTE — Telephone Encounter (Signed)
 He can add ibuprofen  if needed and able. Cannot write anything stronger

## 2024-02-28 ENCOUNTER — Other Ambulatory Visit: Payer: Self-pay

## 2024-02-28 ENCOUNTER — Ambulatory Visit (INDEPENDENT_AMBULATORY_CARE_PROVIDER_SITE_OTHER): Payer: Self-pay | Admitting: Physician Assistant

## 2024-02-28 ENCOUNTER — Other Ambulatory Visit: Payer: Self-pay | Admitting: Nurse Practitioner

## 2024-02-28 DIAGNOSIS — G5601 Carpal tunnel syndrome, right upper limb: Secondary | ICD-10-CM

## 2024-02-28 DIAGNOSIS — E782 Mixed hyperlipidemia: Secondary | ICD-10-CM

## 2024-02-28 DIAGNOSIS — Z9889 Other specified postprocedural states: Secondary | ICD-10-CM

## 2024-02-28 MED ORDER — ROSUVASTATIN CALCIUM 20 MG PO TABS
20.0000 mg | ORAL_TABLET | Freq: Every day | ORAL | 0 refills | Status: DC
  Filled 2024-02-28 – 2024-03-29 (×2): qty 30, 30d supply, fill #0

## 2024-02-28 MED ORDER — HYDROCODONE-ACETAMINOPHEN 5-325 MG PO TABS
1.0000 | ORAL_TABLET | Freq: Two times a day (BID) | ORAL | 0 refills | Status: DC | PRN
  Filled 2024-02-28: qty 10, 5d supply, fill #0

## 2024-02-28 MED ORDER — DICLOFENAC SODIUM 75 MG PO TBEC
75.0000 mg | DELAYED_RELEASE_TABLET | Freq: Two times a day (BID) | ORAL | 2 refills | Status: DC
  Filled 2024-02-28: qty 60, 30d supply, fill #0
  Filled 2024-03-29: qty 60, 30d supply, fill #1

## 2024-02-28 NOTE — Addendum Note (Signed)
 Addended by: Sandie Cross on: 02/28/2024 11:30 AM   Modules accepted: Orders

## 2024-02-28 NOTE — Progress Notes (Signed)
 Post-Op Visit Note   Patient: Shawn Porter           Date of Birth: [birth year]           MRN: [id number] Visit Date: 02/28/2024 PCP: Collins Dean, NP   Assessment & Plan:  Chief Complaint:  Chief Complaint  Patient presents with   Right Hand - Pain, Routine Post Op   Visit Diagnoses:  1. Carpal tunnel syndrome on right   2. S/P carpal tunnel release     Plan: Patient is a pleasant 53 year old Spanish-speaking gentleman is here today with an interpreter.  He is 1 week status post right carpal tunnel release 02/21/2024.  This was for severe carpal tunnel syndrome.  He has been doing okay.  He is still in some pain and is taking Norco.  He complains of paresthesias to the thumb, index and long fingers which has not really improved since surgery.  Examination of his right hand reveals a well-healing surgical incision with nylon sutures in place.  No evidence of infection or cellulitis.  Fingers are warm well-perfused.  He does have decree sensation to the tips of the thumb, index and long fingers.  Today, his wound was cleaned and recovered.  Velcro splint applied.  He will wear this at all times for the next week.  Follow-up next week for suture removal.  No heavy lifting or submerging his hand underwater until he is 4 weeks postop.  Follow-Up Instructions: Return in about 1 week (around 03/06/2024).   Orders:  No orders of the defined types were placed in this encounter.  Meds ordered this encounter  Medications   HYDROcodone -acetaminophen  (NORCO/VICODIN) 5-325 MG tablet    Sig: Take 1 tablet by mouth 2 (two) times daily as needed for moderate pain (pain score 4-6).    Dispense:  10 tablet    Refill:  0    Imaging: No new imaging  PMFS History: Patient Active Problem List   Diagnosis Date Noted   Carpal tunnel syndrome on right 02/21/2024   Carpal tunnel syndrome on left 02/21/2024   Supraglottitis 11/05/2022   Discoloration of eye 11/23/2016   Palpitations  11/23/2016   Dyslipidemia 11/23/2016   Wound infection after surgery 11/06/2015   Sebaceous cyst 10/19/2015   Health care maintenance 10/01/2015   Right inguinal hernia 10/27/2014   Hypertriglyceridemia 10/27/2014   Pain in joint, shoulder region 10/27/2014   Essential hypertension, benign 08/26/2014   Dental caries 08/26/2014   Radicular pain in right arm 10/16/2013   Right arm pain 07/19/2013   Past Medical History:  Diagnosis Date   CTS (carpal tunnel syndrome)    Hyperlipidemia    Hypertension     Family History  Problem Relation Age of Onset   Healthy Mother    Heart failure Father    Diabetes Sister     Past Surgical History:  Procedure Laterality Date   CARPAL TUNNEL RELEASE Right 02/21/2024   Procedure: CARPAL TUNNEL RELEASE;  Surgeon: Wes Hamman, MD;  Location: [facility];  Service: Orthopedics;  Laterality: Right;   HERNIA REPAIR     STERIOD INJECTION Left 02/21/2024   Procedure: STEROID INJECTION;  Surgeon: Wes Hamman, MD;  Location: [facility];  Service: Orthopedics;  Laterality: Left;   Social History   Occupational History   Not on file  Tobacco Use   Smoking status: Never   Smokeless tobacco: Never  Vaping Use   Vaping status: Never Used  Substance and Sexual Activity   Alcohol use: Not Currently    Comment: occasionally   Drug use: No   Sexual activity: Not on file

## 2024-03-06 ENCOUNTER — Ambulatory Visit (INDEPENDENT_AMBULATORY_CARE_PROVIDER_SITE_OTHER): Payer: Self-pay | Admitting: Physician Assistant

## 2024-03-06 DIAGNOSIS — Z9889 Other specified postprocedural states: Secondary | ICD-10-CM

## 2024-03-06 DIAGNOSIS — G5601 Carpal tunnel syndrome, right upper limb: Secondary | ICD-10-CM

## 2024-03-06 NOTE — Progress Notes (Signed)
   Post-Op Visit Note   Patient: Shawn Porter           Date of Birth: May 11, 1971           MRN: [id number] Visit Date: 03/06/2024 PCP: Collins Dean, NP   Assessment & Plan:  Chief Complaint:  Chief Complaint  Patient presents with   Right Wrist - Follow-up    Right carpal tunnel release 02/21/2024   Visit Diagnoses:  1. Carpal tunnel syndrome on right   2. S/P carpal tunnel release     Plan: Patient is a pleasant 53 year old Spanish-speaking gentleman here today with an interpreter.  He is 2 weeks status post right carpal tunnel release 02/21/2024.  This was for severe compression.  He has been doing okay.  He still notes some discomfort.  He is also still experiencing some paresthesias to the thumb, index and long fingertips.  Examination of his right hand reveals a well-healed surgical incision with nylon sutures in place.  No evidence of infection or cellulitis.  Decree sensation to the thumb, index and long fingertips.  Fingers are warm and well-perfused.  Today, sutures were removed and Steri-Strips applied.  No heavy lifting or submerging his hand underwater for another 2 weeks.  Follow-up in 2 weeks for recheck.  Call with concerns or questions.  Follow-Up Instructions: Return in about 2 weeks (around 03/20/2024).   Orders:  No orders of the defined types were placed in this encounter.  No orders of the defined types were placed in this encounter.   Imaging: No new imaging  PMFS History: Patient Active Problem List   Diagnosis Date Noted   Carpal tunnel syndrome on right 02/21/2024   Carpal tunnel syndrome on left 02/21/2024   Supraglottitis 11/05/2022   Discoloration of eye 11/23/2016   Palpitations 11/23/2016   Dyslipidemia 11/23/2016   Wound infection after surgery 11/06/2015   Sebaceous cyst 10/19/2015   Health care maintenance 10/01/2015   Right inguinal hernia 10/27/2014   Hypertriglyceridemia 10/27/2014   Pain in joint, shoulder region 10/27/2014    Essential hypertension, benign 08/26/2014   Dental caries 08/26/2014   Radicular pain in right arm 10/16/2013   Right arm pain 07/19/2013   Past Medical History:  Diagnosis Date   CTS (carpal tunnel syndrome)    Hyperlipidemia    Hypertension     Family History  Problem Relation Age of Onset   Healthy Mother    Heart failure Father    Diabetes Sister     Past Surgical History:  Procedure Laterality Date   CARPAL TUNNEL RELEASE Right 02/21/2024   Procedure: CARPAL TUNNEL RELEASE;  Surgeon: Wes Hamman, MD;  Location: [facility];  Service: Orthopedics;  Laterality: Right;   HERNIA REPAIR     STERIOD INJECTION Left 02/21/2024   Procedure: STEROID INJECTION;  Surgeon: Wes Hamman, MD;  Location: [facility];  Service: Orthopedics;  Laterality: Left;   Social History   Occupational History   Not on file  Tobacco Use   Smoking status: Never   Smokeless tobacco: Never  Vaping Use   Vaping status: Never Used  Substance and Sexual Activity   Alcohol use: Not Currently    Comment: occasionally   Drug use: No   Sexual activity: Not on file

## 2024-03-11 ENCOUNTER — Ambulatory Visit: Payer: Self-pay | Admitting: Nurse Practitioner

## 2024-03-14 ENCOUNTER — Other Ambulatory Visit: Payer: Self-pay

## 2024-03-20 ENCOUNTER — Encounter: Payer: Self-pay | Admitting: Physician Assistant

## 2024-03-20 ENCOUNTER — Ambulatory Visit (INDEPENDENT_AMBULATORY_CARE_PROVIDER_SITE_OTHER): Payer: Self-pay | Admitting: Physician Assistant

## 2024-03-20 DIAGNOSIS — Z9889 Other specified postprocedural states: Secondary | ICD-10-CM

## 2024-03-20 DIAGNOSIS — G5601 Carpal tunnel syndrome, right upper limb: Secondary | ICD-10-CM

## 2024-03-20 NOTE — Progress Notes (Signed)
   Post-Op Visit Note   Patient: Shawn Porter           Date of Birth: 1971/09/18           MRN: [id number] Visit Date: 03/20/2024 PCP: Collins Dean, NP   Assessment & Plan:  Chief Complaint:  Chief Complaint  Patient presents with   Right Wrist - Follow-up    Right carpal tunnel release 02/21/2024   Visit Diagnoses:  1. Carpal tunnel syndrome on right   2. S/P carpal tunnel release     Plan: patient is a pleasant 53 year old Spanish-speaking gentleman here today with interpreter.  He is 4 weeks status post right carpal tunnel release, date of surgery 02/21/2024 for severe compression of median nerve.  He has been doing much better.  Little pain.  He still has decree sensation to the thumb, index and long fingertips.  Examination of the right hand reveals a fully healed surgical scar without complication.  Decree sensation to the tips of the thumb, index and long fingers.  Fingers warm well-perfused.  At this point, he can increase activity as tolerated.  We did discuss the nature of severe carpal tunnel syndrome and that there is a possibility his sensation may not fully return.  He understands and agrees.  He will follow-up as needed.  Follow-Up Instructions: Return if symptoms worsen or fail to improve.   Orders:  No orders of the defined types were placed in this encounter.  No orders of the defined types were placed in this encounter.   Imaging: No new imaging  PMFS History: Patient Active Problem List   Diagnosis Date Noted   Carpal tunnel syndrome on right 02/21/2024   Carpal tunnel syndrome on left 02/21/2024   Supraglottitis 11/05/2022   Discoloration of eye 11/23/2016   Palpitations 11/23/2016   Dyslipidemia 11/23/2016   Wound infection after surgery 11/06/2015   Sebaceous cyst 10/19/2015   Health care maintenance 10/01/2015   Right inguinal hernia 10/27/2014   Hypertriglyceridemia 10/27/2014   Pain in joint, shoulder region 10/27/2014   Essential  hypertension, benign 08/26/2014   Dental caries 08/26/2014   Radicular pain in right arm 10/16/2013   Right arm pain 07/19/2013   Past Medical History:  Diagnosis Date   CTS (carpal tunnel syndrome)    Hyperlipidemia    Hypertension     Family History  Problem Relation Age of Onset   Healthy Mother    Heart failure Father    Diabetes Sister     Past Surgical History:  Procedure Laterality Date   CARPAL TUNNEL RELEASE Right 02/21/2024   Procedure: CARPAL TUNNEL RELEASE;  Surgeon: Wes Hamman, MD;  Location: [facility];  Service: Orthopedics;  Laterality: Right;   HERNIA REPAIR     STERIOD INJECTION Left 02/21/2024   Procedure: STEROID INJECTION;  Surgeon: Wes Hamman, MD;  Location: [facility];  Service: Orthopedics;  Laterality: Left;   Social History   Occupational History   Not on file  Tobacco Use   Smoking status: Never   Smokeless tobacco: Never  Vaping Use   Vaping status: Never Used  Substance and Sexual Activity   Alcohol use: Not Currently    Comment: occasionally   Drug use: No   Sexual activity: Not on file

## 2024-03-26 ENCOUNTER — Other Ambulatory Visit: Payer: Self-pay

## 2024-03-29 ENCOUNTER — Other Ambulatory Visit: Payer: Self-pay

## 2024-04-01 ENCOUNTER — Other Ambulatory Visit: Payer: Self-pay

## 2024-04-19 ENCOUNTER — Telehealth: Payer: Self-pay | Admitting: Nurse Practitioner

## 2024-04-19 NOTE — Telephone Encounter (Addendum)
 Called & spoke to the patient. Verified name & DOB. Patient has been scheduled for 04/22/2024 at 9:00 am for nurse visit for Tdap, and Hepatitis b vaccine. Patient was informed that our office does not administer the Varicella, MMR, or Polio vaccines. Patient was advised to contact the local health department for these services. Patient acknowledged appointment and had no further questions.

## 2024-04-19 NOTE — Telephone Encounter (Signed)
 Copied from CRM [id number]. Topic: Clinical - Medical Advice >> Apr 19, 2024  1:21 PM Carla L wrote: Reason for CRM: Pt's wife states pt is in need of following vaccines, varicella, mmr, tdap, hepatitis b, polio. Pt in need of vaccines as soon as possible, inquiring if able to do all vaccines at office and if able to do so on same day.   Please assist pt further, please reach out to Wife, [phone]

## 2024-04-22 ENCOUNTER — Ambulatory Visit: Payer: Self-pay | Attending: Nurse Practitioner

## 2024-04-22 DIAGNOSIS — Z23 Encounter for immunization: Secondary | ICD-10-CM

## 2024-04-22 NOTE — Progress Notes (Signed)
 Hep B vaccine administered in right deltoid per protocols. TDAP  administered in left deltoid per protocols. Information sheet given. Patient denies and pain or discomfort at injection site. Tolerated injection well no reaction.

## 2024-06-04 ENCOUNTER — Other Ambulatory Visit: Payer: Self-pay | Admitting: Family Medicine

## 2024-06-04 DIAGNOSIS — E782 Mixed hyperlipidemia: Secondary | ICD-10-CM

## 2024-06-05 ENCOUNTER — Other Ambulatory Visit: Payer: Self-pay

## 2024-06-05 MED ORDER — ROSUVASTATIN CALCIUM 20 MG PO TABS
20.0000 mg | ORAL_TABLET | Freq: Every day | ORAL | 0 refills | Status: DC
  Filled 2024-06-05 – 2024-07-14 (×2): qty 30, 30d supply, fill #0

## 2024-06-12 ENCOUNTER — Other Ambulatory Visit: Payer: Self-pay

## 2024-06-14 ENCOUNTER — Other Ambulatory Visit: Payer: Self-pay

## 2024-07-02 ENCOUNTER — Other Ambulatory Visit: Payer: Self-pay

## 2024-07-15 ENCOUNTER — Other Ambulatory Visit: Payer: Self-pay

## 2024-07-16 ENCOUNTER — Ambulatory Visit: Payer: Self-pay | Attending: Nurse Practitioner | Admitting: Nurse Practitioner

## 2024-07-16 ENCOUNTER — Other Ambulatory Visit: Payer: Self-pay

## 2024-07-16 ENCOUNTER — Encounter: Payer: Self-pay | Admitting: Nurse Practitioner

## 2024-07-16 VITALS — BP 144/89 | HR 67 | Resp 18 | Ht 65.0 in | Wt 177.0 lb

## 2024-07-16 DIAGNOSIS — Z79899 Other long term (current) drug therapy: Secondary | ICD-10-CM

## 2024-07-16 DIAGNOSIS — Z1211 Encounter for screening for malignant neoplasm of colon: Secondary | ICD-10-CM

## 2024-07-16 DIAGNOSIS — Z7984 Long term (current) use of oral hypoglycemic drugs: Secondary | ICD-10-CM

## 2024-07-16 DIAGNOSIS — R7989 Other specified abnormal findings of blood chemistry: Secondary | ICD-10-CM

## 2024-07-16 DIAGNOSIS — I10 Essential (primary) hypertension: Secondary | ICD-10-CM

## 2024-07-16 DIAGNOSIS — E782 Mixed hyperlipidemia: Secondary | ICD-10-CM

## 2024-07-16 DIAGNOSIS — M2550 Pain in unspecified joint: Secondary | ICD-10-CM

## 2024-07-16 MED ORDER — AMLODIPINE BESYLATE 10 MG PO TABS
10.0000 mg | ORAL_TABLET | Freq: Every day | ORAL | 1 refills | Status: DC
  Filled 2024-07-16 – 2024-10-08 (×2): qty 90, 90d supply, fill #0

## 2024-07-16 MED ORDER — ROSUVASTATIN CALCIUM 20 MG PO TABS
20.0000 mg | ORAL_TABLET | Freq: Every day | ORAL | 1 refills | Status: DC
  Filled 2024-07-16: qty 90, 90d supply, fill #0

## 2024-07-16 MED ORDER — DICLOFENAC SODIUM 75 MG PO TBEC
75.0000 mg | DELAYED_RELEASE_TABLET | Freq: Two times a day (BID) | ORAL | 2 refills | Status: AC
  Filled 2024-07-16: qty 60, 30d supply, fill #0

## 2024-07-16 NOTE — Progress Notes (Signed)
 Assessment & Plan:  [name] was seen today for hypertension.  Diagnoses and all orders for this visit:  Primary hypertension -     CMP14+EGFR -     amLODipine  (NORVASC ) 10 MG tablet; Take 1 tablet (10 mg total) by mouth daily. Blood pressure elevated today despite medication adherence. Previously well-controlled in February and April. Currently on amlodipine . - Recheck blood pressure today. - Refill amlodipine  prescription.  Mixed hypercholesterolemia and hypertriglyceridemia -     Lipid panel -     rosuvastatin  (CRESTOR ) 20 MG tablet; Take 1 tablet (20 mg total) by mouth daily. Currently on rosuvastatin  and omega 3. Only one cholesterol medication was picked up recently. - Check cholesterol levels today. - Do not send omega 3 to pharmacy if current supply is sufficient.   Colon cancer screening -     Fecal occult blood, imunochemical([facility])  Abnormal CBC -     CBC with Differential/Platelet  Multiple joint pain -     diclofenac  (VOLTAREN ) 75 MG EC tablet; Take 1 tablet (75 mg total) by mouth 2 (two) times daily. For joint pain Knee and leg pain, likely related to occupational overuse activities such as kneeling and bending. - Advise use of knee pads at work. - Refill Voltaren  gel for joint pain.  General Health Maintenance Colorectal cancer screening is due. Prostate health discussed  - Provide stool kit for colon cancer screening. - Instruct to return stool kit to lab within 24 hours of bowel movement.  Patient has been counseled on age-appropriate routine health concerns for screening and prevention. These are reviewed and up-to-date. Referrals have been placed accordingly. Immunizations are up-to-date or declined.    Subjective:   Chief Complaint  Patient presents with   Hypertension    History of Present Illness Shawn Porter is a 53 year old male with hypertension and hyperlipidemia who presents for follow-up of his blood pressure and cholesterol  management.  VRI was used to communicate directly with patient for the entire encounter including providing detailed patient instructions.     HTN Blood pressure is elevated today despite him taking amlodipine  10 mg daily as prescribed.  He was previously on lisinopril  BP Readings from Last 3 Encounters:  07/16/24 (!) 144/89  02/21/24 122/88  12/12/23 122/72      He experiences bilateral hand, knee and leg pain, which he attributes to his job in [employer]. He describes a bulging area in his right hand following a previous surgery. The hand feels fine post-surgery, but he is concerned about the bulging area.    For hyperlipidemia, he is on rosuvastatin  and omega-3.  Lab Results  Component Value Date   LDLCALC 126 (H) 02/28/2022      Review of Systems  Constitutional:  Negative for fever, malaise/fatigue and weight loss.  Respiratory: Negative.  Negative for cough and shortness of breath.   Cardiovascular: Negative.  Negative for chest pain, palpitations and leg swelling.  Gastrointestinal: Negative.  Negative for heartburn, nausea and vomiting.  Musculoskeletal:  Positive for joint pain. Negative for myalgias.  Neurological: Negative.  Negative for dizziness, focal weakness, seizures and headaches.  Psychiatric/Behavioral: Negative.  Negative for suicidal ideas.     Past Medical History:  Diagnosis Date   CTS (carpal tunnel syndrome)    Hyperlipidemia    Hypertension     Past Surgical History:  Procedure Laterality Date   CARPAL TUNNEL RELEASE Right 02/21/2024   Procedure: CARPAL TUNNEL RELEASE;  Surgeon: Jerri Kay HERO, MD;  Location: [facility];  Service: Orthopedics;  Laterality: Right;   HERNIA REPAIR     STERIOD INJECTION Left 02/21/2024   Procedure: STEROID INJECTION;  Surgeon: Jerri Kay HERO, MD;  Location: [facility];  Service: Orthopedics;  Laterality: Left;    Family History  Problem Relation Age of Onset   Healthy Mother     Heart failure Father    Diabetes Sister     Social History Reviewed with no changes to be made today.   Outpatient Medications Prior to Visit  Medication Sig Dispense Refill   aspirin  EC 81 MG tablet Take 81 mg by mouth 3 (three) times a week. Swallow whole.     omega-3 acid ethyl esters (LOVAZA ) 1 g capsule Take 2 capsules (2 g total) by mouth 2 (two) times daily. 360 capsule 1   amLODipine  (NORVASC ) 10 MG tablet Take 1 tablet (10 mg total) by mouth daily. 90 tablet 1   lisinopril  (ZESTRIL ) 10 MG tablet Take by mouth.     rosuvastatin  (CRESTOR ) 20 MG tablet Take 1 tablet (20 mg total) by mouth daily. 30 tablet 0   diclofenac  (VOLTAREN ) 75 MG EC tablet Take 1 tablet (75 mg total) by mouth 2 (two) times daily. (Patient not taking: Reported on 07/16/2024) 60 tablet 2   HYDROcodone -acetaminophen  (NORCO/VICODIN) 5-325 MG tablet Take 1 tablet by mouth 2 (two) times daily as needed for moderate pain (pain score 4-6). (Patient not taking: Reported on 07/16/2024) 10 tablet 0   ondansetron  (ZOFRAN ) 4 MG tablet Take 1 tablet (4 mg total) by mouth every 8 (eight) hours as needed for nausea or vomiting. (Patient not taking: Reported on 07/16/2024) 40 tablet 0   No facility-administered medications prior to visit.    No Active Allergies     Objective:    BP (!) 144/89 (BP Location: Left Arm, Patient Position: Sitting, Cuff Size: Normal)   Pulse 67   Resp 18   Ht 5' 5 (1.651 m)   Wt 177 lb (80.3 kg)   SpO2 99%   BMI 29.45 kg/m  Wt Readings from Last 3 Encounters:  07/16/24 177 lb (80.3 kg)  02/21/24 178 lb 12.7 oz (81.1 kg)  12/12/23 180 lb 6.4 oz (81.8 kg)    Physical Exam Vitals and nursing note reviewed.  Constitutional:      Appearance: He is well-developed.  HENT:     Head: Normocephalic and atraumatic.  Cardiovascular:     Rate and Rhythm: Normal rate and regular rhythm.     Heart sounds: Normal heart sounds. No murmur heard.    No friction rub. No gallop.  Pulmonary:      Effort: Pulmonary effort is normal. No tachypnea or respiratory distress.     Breath sounds: Normal breath sounds. No decreased breath sounds, wheezing, rhonchi or rales.  Chest:     Chest wall: No tenderness.  Musculoskeletal:        General: Normal range of motion.     Cervical back: Normal range of motion.  Skin:    General: Skin is warm and dry.  Neurological:     Mental Status: He is alert and oriented to person, place, and time.     Coordination: Coordination normal.  Psychiatric:        Behavior: Behavior normal. Behavior is cooperative.        Thought Content: Thought content normal.        Judgment: Judgment normal.          Patient  has been counseled extensively about nutrition and exercise as well as the importance of adherence with medications and regular follow-up. The patient was given clear instructions to go to ER or return to medical center if symptoms don't improve, worsen or new problems develop. The patient verbalized understanding.   Follow-up: Return in about 4 months (around 11/15/2024).   Haze LELON Servant, FNP-BC [facility] [place], [place] [phone]   07/16/2024, 6:20 PM

## 2024-07-17 ENCOUNTER — Ambulatory Visit: Payer: Self-pay | Admitting: Nurse Practitioner

## 2024-07-17 DIAGNOSIS — E782 Mixed hyperlipidemia: Secondary | ICD-10-CM

## 2024-07-17 LAB — CMP14+EGFR
ALT: 78 IU/L — ABNORMAL HIGH (ref 0–44)
AST: 37 IU/L (ref 0–40)
Albumin: 5 g/dL — ABNORMAL HIGH (ref 3.8–4.9)
Alkaline Phosphatase: 80 IU/L (ref 47–123)
BUN/Creatinine Ratio: 17 (ref 9–20)
BUN: 18 mg/dL (ref 6–24)
Bilirubin Total: 1.8 mg/dL — ABNORMAL HIGH (ref 0.0–1.2)
CO2: 23 mmol/L (ref 20–29)
Calcium: 9.7 mg/dL (ref 8.7–10.2)
Chloride: 104 mmol/L (ref 96–106)
Creatinine, Ser: 1.07 mg/dL (ref 0.76–1.27)
Globulin, Total: 2 g/dL (ref 1.5–4.5)
Glucose: 102 mg/dL — ABNORMAL HIGH (ref 70–99)
Potassium: 4.2 mmol/L (ref 3.5–5.2)
Sodium: 140 mmol/L (ref 134–144)
Total Protein: 7 g/dL (ref 6.0–8.5)
eGFR: 82 mL/min/1.73 (ref >59)

## 2024-07-17 LAB — CBC WITH DIFFERENTIAL/PLATELET
Basophils Absolute: 0.1 x10E3/uL (ref 0.0–0.2)
Basos: 2 %
EOS (ABSOLUTE): 0.1 x10E3/uL (ref 0.0–0.4)
Eos: 3 %
Hematocrit: 52.3 % — ABNORMAL HIGH (ref 37.5–51.0)
Hemoglobin: 17.9 g/dL — ABNORMAL HIGH (ref 13.0–17.7)
Immature Grans (Abs): 0 x10E3/uL (ref 0.0–0.1)
Immature Granulocytes: 0 %
Lymphocytes Absolute: 2 x10E3/uL (ref 0.7–3.1)
Lymphs: 43 %
MCH: 33.8 pg — ABNORMAL HIGH (ref 26.6–33.0)
MCHC: 34.2 g/dL (ref 31.5–35.7)
MCV: 99 fL — ABNORMAL HIGH (ref 79–97)
Monocytes Absolute: 0.4 x10E3/uL (ref 0.1–0.9)
Monocytes: 9 %
Neutrophils Absolute: 2 x10E3/uL (ref 1.4–7.0)
Neutrophils: 43 %
Platelets: 185 x10E3/uL (ref 150–450)
RBC: 5.29 x10E6/uL (ref 4.14–5.80)
RDW: 12.5 % (ref 11.6–15.4)
WBC: 4.6 x10E3/uL (ref 3.4–10.8)

## 2024-07-17 LAB — LIPID PANEL
Chol/HDL Ratio: 5.6 ratio — ABNORMAL HIGH (ref 0.0–5.0)
Cholesterol, Total: 207 mg/dL — ABNORMAL HIGH (ref 100–199)
HDL: 37 mg/dL — ABNORMAL LOW (ref >39)
LDL Chol Calc (NIH): 101 mg/dL — ABNORMAL HIGH (ref 0–99)
Triglycerides: 410 mg/dL — ABNORMAL HIGH (ref 0–149)
VLDL Cholesterol Cal: 69 mg/dL — ABNORMAL HIGH (ref 5–40)

## 2024-07-17 MED ORDER — ROSUVASTATIN CALCIUM 20 MG PO TABS
20.0000 mg | ORAL_TABLET | Freq: Every day | ORAL | 1 refills | Status: DC
  Filled 2024-07-17 – 2024-08-23 (×2): qty 90, 90d supply, fill #0

## 2024-07-17 MED ORDER — OMEGA-3-ACID ETHYL ESTERS 1 G PO CAPS
2.0000 g | ORAL_CAPSULE | Freq: Two times a day (BID) | ORAL | 1 refills | Status: AC
  Filled 2024-07-17: qty 120, 30d supply, fill #0
  Filled 2024-10-08: qty 120, 30d supply, fill #1

## 2024-07-18 ENCOUNTER — Other Ambulatory Visit: Payer: Self-pay

## 2024-07-19 ENCOUNTER — Other Ambulatory Visit: Payer: Self-pay

## 2024-08-23 ENCOUNTER — Other Ambulatory Visit: Payer: Self-pay

## 2024-08-26 ENCOUNTER — Other Ambulatory Visit: Payer: Self-pay

## 2024-09-02 ENCOUNTER — Encounter: Payer: Self-pay | Admitting: Radiology

## 2024-10-08 ENCOUNTER — Other Ambulatory Visit: Payer: Self-pay

## 2024-10-15 ENCOUNTER — Ambulatory Visit: Payer: Self-pay | Admitting: Nurse Practitioner

## 2024-10-17 ENCOUNTER — Other Ambulatory Visit: Payer: Self-pay

## 2024-11-20 ENCOUNTER — Telehealth: Payer: Self-pay | Admitting: Nurse Practitioner

## 2024-11-20 NOTE — Telephone Encounter (Signed)
 Pt confirmed appt

## 2024-11-22 ENCOUNTER — Ambulatory Visit: Payer: Self-pay | Attending: Nurse Practitioner | Admitting: Nurse Practitioner

## 2024-11-22 ENCOUNTER — Encounter: Payer: Self-pay | Admitting: Nurse Practitioner

## 2024-11-22 ENCOUNTER — Other Ambulatory Visit: Payer: Self-pay

## 2024-11-22 VITALS — BP 138/88 | HR 57 | Ht 65.0 in | Wt 183.8 lb

## 2024-11-22 DIAGNOSIS — Z1211 Encounter for screening for malignant neoplasm of colon: Secondary | ICD-10-CM

## 2024-11-22 DIAGNOSIS — I10 Essential (primary) hypertension: Secondary | ICD-10-CM

## 2024-11-22 DIAGNOSIS — E782 Mixed hyperlipidemia: Secondary | ICD-10-CM

## 2024-11-22 DIAGNOSIS — Z23 Encounter for immunization: Secondary | ICD-10-CM

## 2024-11-22 MED ORDER — ROSUVASTATIN CALCIUM 20 MG PO TABS
20.0000 mg | ORAL_TABLET | Freq: Every day | ORAL | 1 refills | Status: AC
  Filled 2024-11-22: qty 90, 90d supply, fill #0

## 2024-11-22 MED ORDER — LISINOPRIL 10 MG PO TABS
10.0000 mg | ORAL_TABLET | Freq: Every day | ORAL | 3 refills | Status: AC
  Filled 2024-11-22: qty 90, 90d supply, fill #0

## 2024-11-22 MED ORDER — AMLODIPINE BESYLATE 10 MG PO TABS
10.0000 mg | ORAL_TABLET | Freq: Every day | ORAL | 1 refills | Status: AC
  Filled 2024-11-22: qty 90, 90d supply, fill #0

## 2024-11-22 NOTE — Progress Notes (Signed)
 "  Assessment & Plan:  [name] was seen today for medical management of chronic issues and blurred vision.  Diagnoses and all orders for this visit:  Primary hypertension Add lisinopril . Continue amlodipine  -     amLODipine  (NORVASC ) 10 MG tablet; Take 1 tablet (10 mg total) by mouth daily. -     CMP14+EGFR -     lisinopril  (ZESTRIL ) 10 MG tablet; Take 1 tablet (10 mg total) by mouth daily. Continue all antihypertensives as prescribed.  Reminded to bring in blood pressure log for follow  up appointment.  RECOMMENDATIONS: DASH/Mediterranean Diets are healthier choices for HTN.    Mixed hypercholesterolemia and hypertriglyceridemia -     rosuvastatin  (CRESTOR ) 20 MG tablet; Take 1 tablet (20 mg total) by mouth daily. INSTRUCTIONS: Work on a low fat, heart healthy diet and participate in regular aerobic exercise program by working out at least 150 minutes per week; 5 days a week-30 minutes per day. Avoid red meat/beef/steak,  fried foods. junk foods, sodas, sugary drinks, unhealthy snacking, alcohol and smoking.  Drink at least 80 oz of water per day and monitor your carbohydrate intake daily.    Need for influenza vaccination -     Flu vaccine trivalent PF, 6mos and older(Flulaval,Afluria,Fluarix,Fluzone)  Colon cancer screening -     Fecal occult blood, imunochemical    Patient has been counseled on age-appropriate routine health concerns for screening and prevention. These are reviewed and up-to-date. Referrals have been placed accordingly. Immunizations are up-to-date or declined.    Subjective:   Chief Complaint  Patient presents with   Medical Management of Chronic Issues   Blurred Vision    Blurred vision started 1 month ago.     Shawn Porter 54 y.o. male presents to office today for follow up to HTN  VRI was used to communicate directly with patient for the entire encounter including providing detailed patient instructions.    HTN Blood pressure is elevated today  despite him taking amlodipine  10 mg daily as prescribed.  He has taken lisinopril  in the past for HTN. He reports similar readings at home and sometimes higher diastolic readings BP Readings from Last 3 Encounters:  11/22/24 138/88  07/16/24 (!) 144/89  02/21/24 122/88      Notes bilateral blurred vision with slow onset over the past few months. No other vision changes or concerns.    Review of Systems  Constitutional:  Negative for fever, malaise/fatigue and weight loss.  HENT: Negative.  Negative for nosebleeds.   Eyes:  Positive for blurred vision. Negative for double vision and photophobia.  Respiratory: Negative.  Negative for cough and shortness of breath.   Cardiovascular: Negative.  Negative for chest pain, palpitations and leg swelling.  Gastrointestinal: Negative.  Negative for heartburn, nausea and vomiting.  Musculoskeletal: Negative.  Negative for myalgias.  Neurological: Negative.  Negative for dizziness, focal weakness, seizures and headaches.  Psychiatric/Behavioral: Negative.  Negative for suicidal ideas.     Past Medical History:  Diagnosis Date   CTS (carpal tunnel syndrome)    Hyperlipidemia    Hypertension     Past Surgical History:  Procedure Laterality Date   CARPAL TUNNEL RELEASE Right 02/21/2024   Procedure: CARPAL TUNNEL RELEASE;  Surgeon: Jerri Kay HERO, MD;  Location: [facility];  Service: Orthopedics;  Laterality: Right;   HERNIA REPAIR     STERIOD INJECTION Left 02/21/2024   Procedure: STEROID INJECTION;  Surgeon: Jerri Kay HERO, MD;  Location: [facility];  Service: Orthopedics;  Laterality: Left;    Family History  Problem Relation Age of Onset   Healthy Mother    Heart failure Father    Diabetes Sister     Social History Reviewed with no changes to be made today.   Outpatient Medications Prior to Visit  Medication Sig Dispense Refill   aspirin  EC 81 MG tablet Take 81 mg by mouth 3 (three) times a week. Swallow  whole.     omega-3 acid ethyl esters (LOVAZA ) 1 g capsule Take 2 capsules (2 g total) by mouth 2 (two) times daily. 360 capsule 1   amLODipine  (NORVASC ) 10 MG tablet Take 1 tablet (10 mg total) by mouth daily. 90 tablet 1   rosuvastatin  (CRESTOR ) 20 MG tablet Take 1 tablet (20 mg total) by mouth daily. 90 tablet 1   diclofenac  (VOLTAREN ) 75 MG EC tablet Take 1 tablet (75 mg total) by mouth 2 (two) times daily. For joint pain (Patient not taking: Reported on 11/22/2024) 60 tablet 2   No facility-administered medications prior to visit.    Allergies[1]     Objective:    BP 138/88 (BP Location: Left Arm, Patient Position: Sitting, Cuff Size: Normal)   Pulse (!) 57   Ht 5' 5 (1.651 m)   Wt 183 lb 12.8 oz (83.4 kg)   SpO2 98%   BMI 30.59 kg/m  Wt Readings from Last 3 Encounters:  11/22/24 183 lb 12.8 oz (83.4 kg)  07/16/24 177 lb (80.3 kg)  02/21/24 178 lb 12.7 oz (81.1 kg)    Physical Exam Vitals and nursing note reviewed.  Constitutional:      Appearance: He is well-developed.  HENT:     Head: Normocephalic and atraumatic.  Cardiovascular:     Rate and Rhythm: Normal rate and regular rhythm.     Heart sounds: Normal heart sounds. No murmur heard.    No friction rub. No gallop.  Pulmonary:     Effort: Pulmonary effort is normal. No tachypnea or respiratory distress.     Breath sounds: Normal breath sounds. No decreased breath sounds, wheezing, rhonchi or rales.  Chest:     Chest wall: No tenderness.  Musculoskeletal:        General: Normal range of motion.     Cervical back: Normal range of motion.  Skin:    General: Skin is warm and dry.  Neurological:     Mental Status: He is alert and oriented to person, place, and time.     Coordination: Coordination normal.  Psychiatric:        Behavior: Behavior normal. Behavior is cooperative.        Thought Content: Thought content normal.        Judgment: Judgment normal.          Patient has been counseled extensively  about nutrition and exercise as well as the importance of adherence with medications and regular follow-up. The patient was given clear instructions to go to ER or return to medical center if symptoms don't improve, worsen or new problems develop. The patient verbalized understanding.   Follow-up: Return in about 3 months (around 02/25/2025).   Haze LELON Servant, FNP-BC [facility] [place], [place] [phone]   11/22/2024, 1:05 PM     [1] No Active Allergies  "

## 2024-11-23 LAB — CMP14+EGFR
ALT: 76 [IU]/L — ABNORMAL HIGH (ref 0–44)
AST: 40 [IU]/L (ref 0–40)
Albumin: 5.1 g/dL — ABNORMAL HIGH (ref 3.8–4.9)
Alkaline Phosphatase: 70 [IU]/L (ref 47–123)
BUN/Creatinine Ratio: 14 (ref 9–20)
BUN: 11 mg/dL (ref 6–24)
Bilirubin Total: 2.6 mg/dL — ABNORMAL HIGH (ref 0.0–1.2)
CO2: 21 mmol/L (ref 20–29)
Calcium: 9.9 mg/dL (ref 8.7–10.2)
Chloride: 103 mmol/L (ref 96–106)
Creatinine, Ser: 0.78 mg/dL (ref 0.76–1.27)
Globulin, Total: 2 g/dL (ref 1.5–4.5)
Glucose: 106 mg/dL — ABNORMAL HIGH (ref 70–99)
Potassium: 4.4 mmol/L (ref 3.5–5.2)
Sodium: 140 mmol/L (ref 134–144)
Total Protein: 7.1 g/dL (ref 6.0–8.5)
eGFR: 106 mL/min/{1.73_m2}

## 2024-11-24 ENCOUNTER — Ambulatory Visit: Payer: Self-pay | Admitting: Nurse Practitioner

## 2025-02-25 ENCOUNTER — Ambulatory Visit: Payer: Self-pay | Admitting: Nurse Practitioner
